# Patient Record
Sex: Male | Born: 1985 | Race: White | Hispanic: No | Marital: Single | Smoking: Never smoker
Health system: Southern US, Community
[De-identification: ages and names within clinical notes are randomized; demographics above are authoritative.]

## PROBLEM LIST (undated history)

## (undated) DIAGNOSIS — R4702 Dysphasia: Secondary | ICD-10-CM

## (undated) DIAGNOSIS — I639 Cerebral infarction, unspecified: Secondary | ICD-10-CM

## (undated) DIAGNOSIS — R519 Headache, unspecified: Secondary | ICD-10-CM

## (undated) DIAGNOSIS — I10 Essential (primary) hypertension: Secondary | ICD-10-CM

## (undated) DIAGNOSIS — I6330 Cerebral infarction due to thrombosis of unspecified cerebral artery: Secondary | ICD-10-CM

## (undated) DIAGNOSIS — E118 Type 2 diabetes mellitus with unspecified complications: Secondary | ICD-10-CM

## (undated) DIAGNOSIS — R51 Headache: Secondary | ICD-10-CM

## (undated) HISTORY — DX: Type 2 diabetes mellitus with unspecified complications: E11.8

## (undated) HISTORY — DX: Essential (primary) hypertension: I10

---

## 2006-05-23 ENCOUNTER — Emergency Department (HOSPITAL_COMMUNITY): Admission: EM | Admit: 2006-05-23 | Discharge: 2006-05-23 | Payer: Self-pay | Admitting: Emergency Medicine

## 2006-05-26 ENCOUNTER — Emergency Department (HOSPITAL_COMMUNITY): Admission: EM | Admit: 2006-05-26 | Discharge: 2006-05-26 | Payer: Self-pay | Admitting: Family Medicine

## 2008-09-03 ENCOUNTER — Emergency Department (HOSPITAL_COMMUNITY): Admission: EM | Admit: 2008-09-03 | Discharge: 2008-09-03 | Payer: Self-pay | Admitting: Family Medicine

## 2008-09-05 ENCOUNTER — Emergency Department (HOSPITAL_COMMUNITY): Admission: EM | Admit: 2008-09-05 | Discharge: 2008-09-05 | Payer: Self-pay | Admitting: Family Medicine

## 2011-06-10 LAB — CULTURE, ROUTINE-ABSCESS

## 2014-02-01 ENCOUNTER — Ambulatory Visit (INDEPENDENT_AMBULATORY_CARE_PROVIDER_SITE_OTHER): Payer: BC Managed Care – PPO | Admitting: Family Medicine

## 2014-02-01 VITALS — BP 158/110 | HR 86 | Temp 98.5°F | Resp 16 | Ht 69.0 in | Wt 245.4 lb

## 2014-02-01 DIAGNOSIS — E669 Obesity, unspecified: Secondary | ICD-10-CM

## 2014-02-01 DIAGNOSIS — IMO0001 Reserved for inherently not codable concepts without codable children: Secondary | ICD-10-CM

## 2014-02-01 DIAGNOSIS — I10 Essential (primary) hypertension: Secondary | ICD-10-CM

## 2014-02-01 LAB — COMPREHENSIVE METABOLIC PANEL
ALK PHOS: 82 U/L (ref 39–117)
ALT: 52 U/L (ref 0–53)
AST: 25 U/L (ref 0–37)
Albumin: 4.7 g/dL (ref 3.5–5.2)
BILIRUBIN TOTAL: 0.7 mg/dL (ref 0.2–1.2)
BUN: 14 mg/dL (ref 6–23)
CO2: 29 mEq/L (ref 19–32)
Calcium: 10.1 mg/dL (ref 8.4–10.5)
Chloride: 99 mEq/L (ref 96–112)
Creat: 1.17 mg/dL (ref 0.50–1.35)
GLUCOSE: 116 mg/dL — AB (ref 70–99)
Potassium: 5 mEq/L (ref 3.5–5.3)
SODIUM: 141 meq/L (ref 135–145)
TOTAL PROTEIN: 7.5 g/dL (ref 6.0–8.3)

## 2014-02-01 LAB — GLUCOSE, POCT (MANUAL RESULT ENTRY): POC Glucose: 111 mg/dl — AB (ref 70–99)

## 2014-02-01 MED ORDER — LISINOPRIL-HYDROCHLOROTHIAZIDE 10-12.5 MG PO TABS: 1.0000 | ORAL_TABLET | Freq: Every day | ORAL | Status: DC

## 2014-02-01 NOTE — Patient Instructions (Signed)
Managing Your High Blood Pressure Blood pressure is a measurement of how forceful your blood is pressing against the walls of the arteries. Arteries are muscular tubes within the circulatory system. Blood pressure does not stay the same. Blood pressure rises when you are active, excited, or nervous; and it lowers during sleep and relaxation. If the numbers measuring your blood pressure stay above normal most of the time, you are at risk for health problems. High blood pressure (hypertension) is a long-term (chronic) condition in which blood pressure is elevated. A blood pressure reading is recorded as two numbers, such as 120 over 80 (or 120/80). The first, higher number is called the systolic pressure. It is a measure of the pressure in your arteries as the heart beats. The second, lower number is called the diastolic pressure. It is a measure of the pressure in your arteries as the heart relaxes between beats.  Keeping your blood pressure in a normal range is important to your overall health and prevention of health problems, such as heart disease and stroke. When your blood pressure is uncontrolled, your heart has to work harder than normal. High blood pressure is a very common condition in adults because blood pressure tends to rise with age. Men and women are equally likely to have hypertension but at different times in life. Before age 45, men are more likely to have hypertension. After 30 years of age, women are more likely to have it. Hypertension is especially common in African Americans. This condition often has no signs or symptoms. The cause of the condition is usually not known. Your caregiver can help you come up with a plan to keep your blood pressure in a normal, healthy range. BLOOD PRESSURE STAGES Blood pressure is classified into four stages: normal, prehypertension, stage 1, and stage 2. Your blood pressure reading will be used to determine what type of treatment, if any, is necessary.  Appropriate treatment options are tied to these four stages:  Normal  Systolic pressure (mm Hg): below 120.  Diastolic pressure (mm Hg): below 80. Prehypertension  Systolic pressure (mm Hg): 120 to 139.  Diastolic pressure (mm Hg): 80 to 89. Stage1  Systolic pressure (mm Hg): 140 to 159.  Diastolic pressure (mm Hg): 90 to 99. Stage2  Systolic pressure (mm Hg): 160 or above.  Diastolic pressure (mm Hg): 100 or above. RISKS RELATED TO HIGH BLOOD PRESSURE Managing your blood pressure is an important responsibility. Uncontrolled high blood pressure can lead to:  A heart attack.  A stroke.  A weakened blood vessel (aneurysm).  Heart failure.  Kidney damage.  Eye damage.  Metabolic syndrome.  Memory and concentration problems. HOW TO MANAGE YOUR BLOOD PRESSURE Blood pressure can be managed effectively with lifestyle changes and medicines (if needed). Your caregiver will help you come up with a plan to bring your blood pressure within a normal range. Your plan should include the following: Education  Read all information provided by your caregivers about how to control blood pressure.  Educate yourself on the latest guidelines and treatment recommendations. New research is always being done to further define the risks and treatments for high blood pressure. Lifestylechanges  Control your weight.  Avoid smoking.  Stay physically active.  Reduce the amount of salt in your diet.  Reduce stress.  Control any chronic conditions, such as high cholesterol or diabetes.  Reduce your alcohol intake. Medicines  Several medicines (antihypertensive medicines) are available, if needed, to bring blood pressure within a normal range.   Communication  Review all the medicines you take with your caregiver because there may be side effects or interactions.  Talk with your caregiver about your diet, exercise habits, and other lifestyle factors that may be contributing to  high blood pressure.  See your caregiver regularly. Your caregiver can help you create and adjust your plan for managing high blood pressure. RECOMMENDATIONS FOR TREATMENT AND FOLLOW-UP  The following recommendations are based on current guidelines for managing high blood pressure in nonpregnant adults. Use these recommendations to identify the proper follow-up period or treatment option based on your blood pressure reading. You can discuss these options with your caregiver.  Systolic pressure of 120 to 139 or diastolic pressure of 80 to 89: Follow up with your caregiver as directed.  Systolic pressure of 140 to 160 or diastolic pressure of 90 to 100: Follow up with your caregiver within 2 months.  Systolic pressure above 160 or diastolic pressure above 100: Follow up with your caregiver within 1 month.  Systolic pressure above 180 or diastolic pressure above 110: Consider antihypertensive therapy; follow up with your caregiver within 1 week.  Systolic pressure above 200 or diastolic pressure above 120: Begin antihypertensive therapy; follow up with your caregiver within 1 week. Document Released: 05/16/2012 Document Reviewed: 05/16/2012 [facility] Patient Information 2014 [employer], [place]. Potassium Content of Foods Potassium is a mineral found in many foods and drinks. It helps keep fluids and minerals balanced in your body and also affects how steadily your heart beats. The body needs potassium to control blood pressure and to keep the muscles and nervous system healthy. However, certain health conditions and medicine may require you to eat more or less potassium-rich foods and drinks. Your caregiver or dietitian will tell you how much potassium you should have each day. COMMON SERVING SIZES The list below tells you how big or small common portion sizes are:  1 oz.........4 stacked dice.  3 oz........Marland KitchenDeck of cards.  1 tsp.......Marland KitchenTip of little finger.  1 tbsp....Marland KitchenMarland KitchenThumb.  2  tbsp....Marland KitchenMarland KitchenGolf ball.   c..........Marland KitchenHalf of a fist.  1 c...........Marland KitchenA fist. FOODS AND DRINKS HIGH IN POTASSIUM More than 200 mg of potassium per serving. A serving size is  c (120 mL or noted gram weight) unless otherwise stated. While all the items on this list are high in potassium, some items are higher in potassium than others. Fruits  Apricots (sliced), 83 g.  Apricots (dried halves), 3 oz / 24 g.  Avocado (cubed),  c / 50 g.  Banana (sliced), 75 g.  Cantaloupe (cubed), 80 g.  Dates (pitted), 5 whole / 35 g.  Figs (dried), 4 whole / 32 g.  Guava, c / 55 g.  Honeydew, 1 wedge / 85 g.  Kiwi (sliced), 90 g.  Nectarine, 1 small / 129 g.  Orange, 1 medium / 131 g.  Orange juice.  Pomegranate seeds, 87 g.  Pomegranate juice.  Prunes (pitted), 3 whole / 30 g.  Prune juice, 3 oz / 90 mL.  Seedless raisins, 3 tbsp / 27 g. Vegetables  Artichoke,  of a medium / 64 g.  Asparagus (boiled), 90 g.  Baked beans,  c / 63 g.  Bamboo shoots,  c / 38 g.  Beets (cooked slices), 85 g.  Broccoli (boiled), 78 g.  Brussels sprout (boiled), 78 g.  Butternut squash (baked), 103 g.  Chickpea (cooked), 82 g.  Green peas (cooked), 80 g.  Hubbard squash (baked cubes),  c / 68 g.  Kidney beans (cooked),  5 tbsp / 55 g.  Lima beans (cooked),  c / 43 g.  Navy beans (cooked),  c / 61 g.  Potato (baked), 61 g.  Potato (boiled), 78 g.  Pumpkin (boiled), 123 g.  Refried beans,  c / 79 g.  Spinach (cooked),  c / 45 g.  Split peas (cooked),  c / 65 g.  Sun-dried tomatoes, 2 tbsp / 7 g.  Sweet potato (baked),  c / 50 g.  Tomato (chopped or sliced), 90 g.  Tomato juice.  Tomato paste, 4 tsp / 21 g.  Tomato sauce,  c / 61 g.  Vegetable juice.  White mushrooms (cooked), 78 g.  Yam (cooked or baked),  c / 34 g.  Zucchini squash (boiled), 90 g. Other Foods and Drinks  Almonds (whole),  c / 36 g.  Cashews (oil roasted),  c / 32  g.  Chocolate milk.  Chocolate pudding, 142 g.  Clams (steamed), 1.5 oz / 43 g.  Dark chocolate, 1.5 oz / 42 g.  Fish, 3 oz / 85 g.  King crab (steamed), 3 oz / 85 g.  Lobster (steamed), 4 oz / 113 g.  Milk (skim, 1%, 2%, whole), 1 c / 240 mL.  Milk chocolate, 2.3 oz / 66 g.  Milk shake.  Nonfat fruit variety yogurt, 123 g.  Peanuts (oil roasted), 1 oz / 28 g.  Peanut butter, 2 tbsp / 32 g.  Pistachio nuts, 1 oz / 28 g.  Pumpkin seeds, 1 oz / 28 g.  Red meat (broiled, cooked, grilled), 3 oz / 85 g.  Scallops (steamed), 3 oz / 85 g.  Shredded wheat cereal (dry), 3 oblong biscuits / 75 g.  Spaghetti sauce,  c / 66 g.  Sunflower seeds (dry roasted), 1 oz / 28 g.  Veggie burger, 1 patty / 70 g. FOODS MODERATE IN POTASSIUM Between 150 mg and 200 mg per serving. A serving is  c (120 mL or noted gram weight) unless otherwise stated. Fruits  Grapefruit,  of the fruit / 123 g.  Grapefruit juice.  Pineapple juice.  Plums (sliced), 83 g.  Tangerine, 1 large / 120 g. Vegetables  Carrots (boiled), 78 g.  Carrots (sliced), 61 g.  Rhubarb (cooked with sugar), 120 g.  Rutabaga (cooked), 120 g.  Sweet corn (cooked), 75 g.  Yellow snap beans (cooked), 63 g. Other Foods and Drinks   Bagel, 1 bagel / 98 g.  Chicken breast (roasted and chopped),  c / 70 g.  Chocolate ice cream / 66 g.  Pita bread, 1 large / 64 g.  Shrimp (steamed), 4 oz / 113 g.  Swiss cheese (diced), 70 g.  Vanilla ice cream, 66 g.  Vanilla pudding, 140 g. FOODS LOW IN POTASSIUM Less than 150 mg per serving. A serving size is  cup (120 mL or noted gram weight) unless otherwise stated. If you eat more than 1 serving of a food low in potassium, the food may be considered a food high in potassium. Fruits  Apple (slices), 55 g.  Apple juice.  Applesauce, 122 g.  Blackberries, 72 g.  Blueberries, 74 g.  Cranberries, 50 g.  Cranberry juice.  Fruit cocktail, 119  g.  Fruit punch.  Grapes, 46 g.  Grape juice.  Mandarin oranges (canned), 126 g.  Peach (slices), 77 g.  Pineapple (chunks), 83 g.  Raspberries, 62 g.  Red cherries (without pits), 78 g.  Strawberries (sliced), 83 g.  Watermelon (diced), 76  g. Vegetables  Alfalfa sprouts, 17 g.  Bell peppers (sliced), 46 g.  Cabbage (shredded), 35 g.  Cauliflower (boiled), 62 g.  Celery, 51 g.  Collard greens (boiled), 95 g.  Cucumber (sliced), 52 g.  Eggplant (cubed), 41 g.  Green beans (boiled), 63 g.  Lettuce (shredded), 1 c / 36 g.  Onions (sauteed), 44 g.  Radishes (sliced), 58 g.  Spaghetti squash, 51 g. Other Foods and Drinks  MGM MIRAGEngel food cake, 1 slice / 28 g.  Black tea.  Brown rice (cooked), 98 g.  Butter croissant, 1 medium / 57 g.  Carbonated soda.  Coffee.  Cheddar cheese (diced), 66 g.  Corn flake cereal (dry), 14 g.  Cottage cheese, 118 g.  Cream of rice cereal (cooked), 122 g.  Cream of wheat cereal (cooked), 126 g.  Crisped rice cereal (dry), 14 g.  Egg (boiled, fried, poached, omelet, scrambled), 1 large / 46 61 g.  English muffin, 1 muffin / 57 g.  Frozen ice pop, 1 pop / 55 g.  Graham cracker, 1 large rectangular cracker / 14 g.  Jelly beans, 112 g.  Non-dairy whipped topping.  Oatmeal, 88 g.  Orange sherbet, 74 g.  Puffed rice cereal (dry), 7 g.  Pasta (cooked), 70 g.  Rice cakes, 4 cakes / 36 g.  Sugared doughnut, 4 oz / 116 g.  White bread, 1 slice / 30 g.  White rice (cooked), 79 93 g.  Wild rice (cooked), 82 g.  Yellow cake, 1 slice / 68 g. Document Released: 04/05/2005 Document Revised: 08/08/2012 Document Reviewed: 01/06/2012 [facility] Patient Information 2014 [place], [place]. DASH Diet The DASH diet stands for "Dietary Approaches to Stop Hypertension." It is a healthy eating plan that has been shown to reduce high blood pressure (hypertension) in as little as 14 days, while also possibly providing  other significant health benefits. These other health benefits include reducing the risk of breast cancer after menopause and reducing the risk of type 2 diabetes, heart disease, colon cancer, and stroke. Health benefits also include weight loss and slowing kidney failure in patients with chronic kidney disease.  DIET GUIDELINES  Limit salt (sodium). Your diet should contain less than 1500 mg of sodium daily.  Limit refined or processed carbohydrates. Your diet should include mostly whole grains. Desserts and added sugars should be used sparingly.  Include small amounts of heart-healthy fats. These types of fats include nuts, oils, and tub margarine. Limit saturated and trans fats. These fats have been shown to be harmful in the body. CHOOSING FOODS  The following food groups are based on a 2000 calorie diet. See your Registered Dietitian for individual calorie needs. Grains and Grain Products (6 to 8 servings daily)  Eat More Often: Whole-wheat bread, brown rice, whole-grain or wheat pasta, quinoa, popcorn without added fat or salt (air popped).  Eat Less Often: White bread, white pasta, white rice, cornbread. Vegetables (4 to 5 servings daily)  Eat More Often: Fresh, frozen, and canned vegetables. Vegetables may be raw, steamed, roasted, or grilled with a minimal amount of fat.  Eat Less Often/Avoid: Creamed or fried vegetables. Vegetables in a cheese sauce. Fruit (4 to 5 servings daily)  Eat More Often: All fresh, canned (in natural juice), or frozen fruits. Dried fruits without added sugar. One hundred percent fruit juice ( cup [237 mL] daily).  Eat Less Often: Dried fruits with added sugar. Canned fruit in light or heavy syrup. [employer] LockerLean Meats, Fish, and Poultry (2 servings or less  daily. One serving is 3 to 4 oz [85-114 g]).  Eat More Often: Ninety percent or leaner ground beef, tenderloin, sirloin. Round cuts of beef, chicken breast, [place] breast. All fish. Grill, bake, or broil your  meat. Nothing should be fried.  Eat Less Often/Avoid: Fatty cuts of meat, [place], or chicken leg, thigh, or wing. Fried cuts of meat or fish. Dairy (2 to 3 servings)  Eat More Often: Low-fat or fat-free milk, low-fat plain or light yogurt, reduced-fat or part-skim cheese.  Eat Less Often/Avoid: Milk (whole, 2%).Whole milk yogurt. Full-fat cheeses. Nuts, Seeds, and Legumes (4 to 5 servings per week)  Eat More Often: All without added salt.  Eat Less Often/Avoid: Salted nuts and seeds, canned beans with added salt. Fats and Sweets (limited)  Eat More Often: Vegetable oils, tub margarines without trans fats, sugar-free gelatin. Mayonnaise and salad dressings.  Eat Less Often/Avoid: Coconut oils, palm oils, butter, stick margarine, cream, half and half, cookies, candy, pie. FOR MORE INFORMATION The Dash Diet Eating Plan: www.dashdiet.org Document Released: 08/11/2011 Document Revised: 11/14/2011 Document Reviewed: 08/11/2011 [facility] Patient Information 2014 [place], [place].

## 2014-02-01 NOTE — Progress Notes (Signed)
Subjective:    Patient ID: Thomas Burch, male    DOB: [birth year], 28 y.o.   MRN: [id number]  HPI This chart was scribed for Norberto Sorenson, MD by Charline Bills, ED Scribe. The patient was seen in room 5. Patient's care was started at 9:12 AM. Chief Complaint  Patient presents with  . Hypertension    Pt states BP has been running elevated since last week-not on any meds    HPI Comments: Thomas Burch is a 28 y.o. male, with a h/o HTN, who presents to the [facility] complaining of elevated BP. Pt was diagnosed with HTN over 5-6 years ago and started on metoprolol which brought his BP down to 120s.  However, after 1-2 yrs on the medicine under a dr's guidance he was able to wean himself off of the metoprolol. Since then he reports that has normally readings are 130s-140s as does check BP at home monthly. He reports BP reading in 170s on Mother's Day weekend while at [facility] when he just incidentally checked. He has checked his BP more often since this reading and reports that his readings from last week were 140-150/100-114. He attributes elevated readings to poor diet during Mother's Day weekend and being awake for 24 hours due to working 3rd shift. He states that he has worked this schedule for 1 year but he is changing shifts soon. He denies associated HA, visual disturbances, SOB, palpitations, chest pain, leg swelling. He has tried cutting back on red meats since last week and has restarted his metoprolol and did take it today.   Pt has never been tested for sleep apnea. Pt states that people tell him "you sound like you're drowning" while sleeping but pt states that he sleeps well on his side. He is open to having a sleep study but states that he is unsure of when he will have time or if his insurance would cover it.  Pt also states that he is getting married in July.  Pt's father has a h/o HTN.  Past Medical History  Diagnosis Date  . Hypertension    No current  outpatient prescriptions on file prior to visit.   No current facility-administered medications on file prior to visit.   No Known Allergies  Review of Systems  Constitutional: Positive for fatigue (due to work). Negative for fever and chills.  Eyes: Negative for visual disturbance.  Respiratory: Positive for apnea. Negative for shortness of breath.   Cardiovascular: Negative for chest pain, palpitations and leg swelling.  Neurological: Negative for dizziness, syncope, facial asymmetry, weakness, light-headedness and headaches.  Psychiatric/Behavioral: Positive for sleep disturbance.     BP 158/110  Pulse 86  Temp(Src) 98.5 F (36.9 C) (Oral)  Resp 16  Ht 5\' 9"  (1.753 m)  Wt 245 lb 6.4 oz (111.313 kg)  BMI 36.22 kg/m2  SpO2 97% Objective:   Physical Exam  Nursing note and vitals reviewed. Constitutional: He is oriented to person, place, and time. He appears well-developed and well-nourished. No distress.  HENT:  Head: Normocephalic and atraumatic.  Eyes: EOM are normal.  Neck: Neck supple. No mass and no thyromegaly present.  Cardiovascular: Normal rate, regular rhythm, S1 normal and S2 normal.   No murmur heard. Pulmonary/Chest: Effort normal and breath sounds normal. No respiratory distress.  Musculoskeletal: Normal range of motion.  Lymphadenopathy:    He has no cervical adenopathy.  Neurological: He is alert and oriented to person, place, and time.  Skin: Skin  is warm and dry.  Psychiatric: He has a normal mood and affect. His behavior is normal.      Results for orders placed in visit on 02/01/14  COMPREHENSIVE METABOLIC PANEL      Result Value Ref Range   Sodium 141  135 - 145 mEq/L   Potassium 5.0  3.5 - 5.3 mEq/L   Chloride 99  96 - 112 mEq/L   CO2 29  19 - 32 mEq/L   Glucose, Bld 116 (*) 70 - 99 mg/dL   BUN 14  6 - 23 mg/dL   Creat 4.091.17  8.110.50 - 9.141.35 mg/dL   Total Bilirubin 0.7  0.2 - 1.2 mg/dL   Alkaline Phosphatase 82  39 - 117 U/L   AST 25  0 - 37 U/L     ALT 52  0 - 53 U/L   Total Protein 7.5  6.0 - 8.3 g/dL   Albumin 4.7  3.5 - 5.2 g/dL   Calcium 78.210.1  8.4 - 95.610.5 mg/dL  GLUCOSE, POCT (MANUAL RESULT ENTRY)      Result Value Ref Range   POC Glucose 111 (*) 70 - 99 mg/dl   Assessment & Plan:   Hypertension - Plan: POCT glucose (manual entry), Comprehensive metabolic panel - did well on metoprolol previously but will instead try below med so stop metoprolol. Cont to monitor bp at home and if 1 wk still >140/95 then increase med to bid and alert office.  F/u for BP recheck w/ me and fasting labs in 2-4 wks. Pt very likely has OSA as underlying cause but is not ready to proceed w/ diagnosis and trx - will reconsider in the future (states when he has more $$ and time).  Obesity, Class II, BMI 35-39.9, with comorbidity - Plan: POCT glucose (manual entry), Comprehensive metabolic panel  Meds ordered this encounter  Medications  . lisinopril-hydrochlorothiazide (PRINZIDE,ZESTORETIC) 10-12.5 MG per tablet    Sig: Take 1 tablet by mouth daily.    Dispense:  90 tablet    Refill:  0    I personally performed the services described in this documentation, which was scribed in my presence. The recorded information has been reviewed and considered, and addended by me as needed.  Norberto SorensonEva Kirstyn Lean, MD MPH

## 2014-02-02 ENCOUNTER — Encounter: Payer: Self-pay | Admitting: Family Medicine

## 2014-10-28 ENCOUNTER — Encounter (HOSPITAL_COMMUNITY): Payer: Self-pay | Admitting: *Deleted

## 2014-10-28 ENCOUNTER — Emergency Department (HOSPITAL_COMMUNITY)
Admission: EM | Admit: 2014-10-28 | Discharge: 2014-10-28 | Disposition: A | Discharge status: Home / Self Care | Payer: BLUE CROSS/BLUE SHIELD | Source: Home / Self Care | Attending: Family Medicine | Admitting: Family Medicine

## 2014-10-28 DIAGNOSIS — L0231 Cutaneous abscess of buttock: Secondary | ICD-10-CM

## 2014-10-28 DIAGNOSIS — I10 Essential (primary) hypertension: Secondary | ICD-10-CM

## 2014-10-28 MED ORDER — HYDROCODONE-ACETAMINOPHEN 5-325 MG PO TABS: 1.0000 | ORAL_TABLET | ORAL | Status: DC | PRN

## 2014-10-28 MED ORDER — LIDOCAINE HCL (PF) 1 % IJ SOLN
INTRAMUSCULAR | Status: AC
  Filled 2014-10-28: qty 5

## 2014-10-28 MED ORDER — SULFAMETHOXAZOLE-TRIMETHOPRIM 800-160 MG PO TABS: 1.0000 | ORAL_TABLET | Freq: Two times a day (BID) | ORAL | Status: AC

## 2014-10-28 NOTE — ED Notes (Signed)
Pt  Has  An  abcess  On     Buttock  Area      X  2  Days  =-   He states  It  Bursted  Last      Pm         He reports   He had  A  Similar  Episode     About  6  Years  Ago          His  BP   Is  Elevated  He  Has  Been  Non  Compliant with His  Lisinopril

## 2014-10-28 NOTE — Discharge Instructions (Signed)
Abscess °An abscess is an infected area that contains a collection of pus and debris. It can occur in almost any part of the body. An abscess is also known as a furuncle or boil. °CAUSES  °An abscess occurs when tissue gets infected. This can occur from blockage of oil or sweat glands, infection of hair follicles, or a minor injury to the skin. As the body tries to fight the infection, pus collects in the area and creates pressure under the skin. This pressure causes pain. People with weakened immune systems have difficulty fighting infections and get certain abscesses more often.  °SYMPTOMS °Usually an abscess develops on the skin and becomes a painful mass that is red, warm, and tender. If the abscess forms under the skin, you may feel a moveable soft area under the skin. Some abscesses break open (rupture) on their own, but most will continue to get worse without care. The infection can spread deeper into the body and eventually into the bloodstream, causing you to feel ill.  °DIAGNOSIS  °Your caregiver will take your medical history and perform a physical exam. A sample of fluid may also be taken from the abscess to determine what is causing your infection. °TREATMENT  °Your caregiver may prescribe antibiotic medicines to fight the infection. However, taking antibiotics alone usually does not cure an abscess. Your caregiver may need to make a small cut (incision) in the abscess to drain the pus. In some cases, gauze is packed into the abscess to reduce pain and to continue draining the area. °HOME CARE INSTRUCTIONS  °· Only take over-the-counter or prescription medicines for pain, discomfort, or fever as directed by your caregiver. °· If you were prescribed antibiotics, take them as directed. Finish them even if you start to feel better. °· If gauze is used, follow your caregiver's directions for changing the gauze. °· To avoid spreading the infection: °· Keep your draining abscess covered with a  bandage. °· Wash your hands well. °· Do not share personal care items, towels, or whirlpools with others. °· Avoid skin contact with others. °· Keep your skin and clothes clean around the abscess. °· Keep all follow-up appointments as directed by your caregiver. °SEEK MEDICAL CARE IF:  °· You have increased pain, swelling, redness, fluid drainage, or bleeding. °· You have muscle aches, chills, or a general ill feeling. °· You have a fever. °MAKE SURE YOU:  °· Understand these instructions. °· Will watch your condition. °· Will get help right away if you are not doing well or get worse. °Document Released: 06/01/2005 Document Revised: 02/21/2012 Document Reviewed: 11/04/2011 °Exit[employer]® Patient Information ©2015 [employer]. This information is not intended to replace advice given to you by your health care provider. Make sure you discuss any questions you have with your health care provider. ° °Hypertension °Hypertension, commonly called high blood pressure, is when the force of blood pumping through your arteries is too strong. Your arteries are the blood vessels that carry blood from your heart throughout your body. A blood pressure reading consists of a higher number over a lower number, such as 110/72. The higher number (systolic) is the pressure inside your arteries when your heart pumps. The lower number (diastolic) is the pressure inside your arteries when your heart relaxes. Ideally you want your blood pressure below 120/80. °Hypertension forces your heart to work harder to pump blood. Your arteries may become narrow or stiff. Having hypertension puts you at risk for heart disease, stroke, and other problems.  °RISK   FACTORS Some risk factors for high blood pressure are controllable. Others are not.  Risk factors you cannot control include:   Race. You may be at higher risk if you are African American.  Age. Risk increases with age.  Gender. Men are at higher risk than women before age 52 years.  After age 67, women are at higher risk than men. Risk factors you can control include:  Not getting enough exercise or physical activity.  Being overweight.  Getting too much fat, sugar, calories, or salt in your diet.  Drinking too much alcohol. SIGNS AND SYMPTOMS Hypertension does not usually cause signs or symptoms. Extremely high blood pressure (hypertensive crisis) may cause headache, anxiety, shortness of breath, and nosebleed. DIAGNOSIS  To check if you have hypertension, your health care provider will measure your blood pressure while you are seated, with your arm held at the level of your heart. It should be measured at least twice using the same arm. Certain conditions can cause a difference in blood pressure between your right and left arms. A blood pressure reading that is higher than normal on one occasion does not mean that you need treatment. If one blood pressure reading is high, ask your health care provider about having it checked again. TREATMENT  Treating high blood pressure includes making lifestyle changes and possibly taking medicine. Living a healthy lifestyle can help lower high blood pressure. You may need to change some of your habits. Lifestyle changes may include:  Following the DASH diet. This diet is high in fruits, vegetables, and whole grains. It is low in salt, red meat, and added sugars.  Getting at least 2 hours of brisk physical activity every week.  Losing weight if necessary.  Not smoking.  Limiting alcoholic beverages.  Learning ways to reduce stress. If lifestyle changes are not enough to get your blood pressure under control, your health care provider may prescribe medicine. You may need to take more than one. Work closely with your health care provider to understand the risks and benefits. HOME CARE INSTRUCTIONS  Have your blood pressure rechecked as directed by your health care provider.   Take medicines only as directed by your health  care provider. Follow the directions carefully. Blood pressure medicines must be taken as prescribed. The medicine does not work as well when you skip doses. Skipping doses also puts you at risk for problems.   Do not smoke.   Monitor your blood pressure at home as directed by your health care provider. SEEK MEDICAL CARE IF:   You think you are having a reaction to medicines taken.  You have recurrent headaches or feel dizzy.  You have swelling in your ankles.  You have trouble with your vision. SEEK IMMEDIATE MEDICAL CARE IF:  You develop a severe headache or confusion.  You have unusual weakness, numbness, or feel faint.  You have severe chest or abdominal pain.  You vomit repeatedly.  You have trouble breathing. MAKE SURE YOU:   Understand these instructions.  Will watch your condition.  Will get help right away if you are not doing well or get worse. Document Released: 08/22/2005 Document Revised: 01/06/2014 Document Reviewed: 06/14/2013 [facility] Patient Information 2015 [place], [place]. This information is not intended to replace advice given to you by your health care provider. Make sure you discuss any questions you have with your health care provider.

## 2014-10-28 NOTE — ED Provider Notes (Addendum)
CSN: [id number]     Arrival date & time 10/28/14  1302 History   First MD Initiated Contact with Patient 10/28/14 1359     Chief Complaint  Patient presents with  . Abscess   (Consider location/radiation/quality/duration/timing/severity/associated sxs/prior Treatment) HPI Comments: 29 year old obese male noticed an area of soreness to the upper left buttock adjacent to the gluteal cleft. Last evening he felt drainage oozing from this lesion. He is still having local tenderness.   Past Medical History  Diagnosis Date  . Hypertension    History reviewed. No pertinent past surgical history. Family History  Problem Relation Age of Onset  . Hypertension Father   . Stroke Father   . Hyperlipidemia Father   . Diabetes Maternal Grandmother    History  Substance Use Topics  . Smoking status: Never Smoker   . Smokeless tobacco: Not on file  . Alcohol Use: Yes     Comment: 1 drink per week    Review of Systems  All other systems reviewed and are negative.   Allergies  Review of patient's allergies indicates no known allergies.  Home Medications   Prior to Admission medications   Medication Sig Start Date End Date Taking? Authorizing Provider  HYDROcodone-acetaminophen (NORCO/VICODIN) 5-325 MG per tablet Take 1 tablet by mouth every 4 (four) hours as needed. 10/28/14   Hayden Rasmussenavid Neisha Hinger, NP  lisinopril-hydrochlorothiazide (PRINZIDE,ZESTORETIC) 10-12.5 MG per tablet Take 1 tablet by mouth daily. 02/01/14   Sherren MochaEva N Shaw, MD  sulfamethoxazole-trimethoprim (BACTRIM DS,SEPTRA DS) 800-160 MG per tablet Take 1 tablet by mouth 2 (two) times daily. 10/28/14 11/04/14  Hayden Rasmussenavid Samier Jaco, NP   BP 210/130 mmHg  Pulse 88  Temp(Src) 99.2 F (37.3 C) (Oral)  Resp 18  SpO2 100% Physical Exam  Constitutional: He is oriented to person, place, and time. He appears well-developed and well-nourished.  Neurological: He is alert and oriented to person, place, and time. He exhibits normal muscle tone.  Skin: Skin is  warm.  There remains a bullous  lesion measuring approximately 3 cm x 2 cm to the left buttock. The area and which she had draining appears to be sealed at this time and no fluid can be manually expressed.  Psychiatric: He has a normal mood and affect.  Nursing note and vitals reviewed.   ED Course  Procedures (including critical care time) Labs Review Labs Reviewed  CULTURE, ROUTINE-ABSCESS    Imaging Review No results found.   MDM   1. Abscess of buttock, left   2. Essential hypertension    Must start taking a blood pressure medicine and follow-up with your PCP regarding your elevated blood pressure. Instructions for abscess given. Return in 3 days, Friday morning for wound check and packing removal. For worsening may return sooner. Warm compresses.   Hayden Rasmussenavid Winifred Balogh, NP 10/28/14 1448  Hayden Rasmussenavid Ophia Shamoon, NP 10/31/14 1359

## 2014-10-31 ENCOUNTER — Encounter (HOSPITAL_COMMUNITY): Payer: Self-pay

## 2014-10-31 ENCOUNTER — Emergency Department (INDEPENDENT_AMBULATORY_CARE_PROVIDER_SITE_OTHER)
Admission: EM | Admit: 2014-10-31 | Discharge: 2014-10-31 | Disposition: A | Discharge status: Home / Self Care | Payer: BLUE CROSS/BLUE SHIELD | Source: Home / Self Care | Attending: Family Medicine | Admitting: Family Medicine

## 2014-10-31 DIAGNOSIS — Z5189 Encounter for other specified aftercare: Secondary | ICD-10-CM

## 2014-10-31 DIAGNOSIS — Z4801 Encounter for change or removal of surgical wound dressing: Secondary | ICD-10-CM

## 2014-10-31 DIAGNOSIS — I159 Secondary hypertension, unspecified: Secondary | ICD-10-CM

## 2014-10-31 MED ORDER — METOPROLOL TARTRATE 25 MG PO TABS: 25.0000 mg | ORAL_TABLET | Freq: Two times a day (BID) | ORAL | Status: DC

## 2014-10-31 NOTE — Discharge Instructions (Signed)
Hypertension Hypertension, commonly called high blood pressure, is when the force of blood pumping through your arteries is too strong. Your arteries are the blood vessels that carry blood from your heart throughout your body. A blood pressure reading consists of a higher number over a lower number, such as 110/72. The higher number (systolic) is the pressure inside your arteries when your heart pumps. The lower number (diastolic) is the pressure inside your arteries when your heart relaxes. Ideally you want your blood pressure below 120/80. Hypertension forces your heart to work harder to pump blood. Your arteries may become narrow or stiff. Having hypertension puts you at risk for heart disease, stroke, and other problems.  RISK FACTORS Some risk factors for high blood pressure are controllable. Others are not.  Risk factors you cannot control include:   Race. You may be at higher risk if you are African American.  Age. Risk increases with age.  Gender. Men are at higher risk than women before age 45 years. After age 65, women are at higher risk than men. Risk factors you can control include:  Not getting enough exercise or physical activity.  Being overweight.  Getting too much fat, sugar, calories, or salt in your diet.  Drinking too much alcohol. SIGNS AND SYMPTOMS Hypertension does not usually cause signs or symptoms. Extremely high blood pressure (hypertensive crisis) may cause headache, anxiety, shortness of breath, and nosebleed. DIAGNOSIS  To check if you have hypertension, your health care provider will measure your blood pressure while you are seated, with your arm held at the level of your heart. It should be measured at least twice using the same arm. Certain conditions can cause a difference in blood pressure between your right and left arms. A blood pressure reading that is higher than normal on one occasion does not mean that you need treatment. If one blood pressure  reading is high, ask your health care provider about having it checked again. TREATMENT  Treating high blood pressure includes making lifestyle changes and possibly taking medicine. Living a healthy lifestyle can help lower high blood pressure. You may need to change some of your habits. Lifestyle changes may include:  Following the DASH diet. This diet is high in fruits, vegetables, and whole grains. It is low in salt, red meat, and added sugars.  Getting at least 2 hours of brisk physical activity every week.  Losing weight if necessary.  Not smoking.  Limiting alcoholic beverages.  Learning ways to reduce stress. If lifestyle changes are not enough to get your blood pressure under control, your health care provider may prescribe medicine. You may need to take more than one. Work closely with your health care provider to understand the risks and benefits. HOME CARE INSTRUCTIONS  Have your blood pressure rechecked as directed by your health care provider.   Take medicines only as directed by your health care provider. Follow the directions carefully. Blood pressure medicines must be taken as prescribed. The medicine does not work as well when you skip doses. Skipping doses also puts you at risk for problems.   Do not smoke.   Monitor your blood pressure at home as directed by your health care provider. SEEK MEDICAL CARE IF:   You think you are having a reaction to medicines taken.  You have recurrent headaches or feel dizzy.  You have swelling in your ankles.  You have trouble with your vision. SEEK IMMEDIATE MEDICAL CARE IF:  You develop a severe headache or confusion.    You have unusual weakness, numbness, or feel faint.  You have severe chest or abdominal pain.  You vomit repeatedly.  You have trouble breathing. MAKE SURE YOU:   Understand these instructions.  Will watch your condition.  Will get help right away if you are not doing well or get  worse. Document Released: 08/22/2005 Document Revised: 01/06/2014 Document Reviewed: 06/14/2013 [employer] Patient Information 2015 [employer]. This information is not intended to replace advice given to you by your health care provider. Make sure you discuss any questions you have with your health care provider. Wound Care Wound care helps prevent pain and infection.  You may need a tetanus shot if:  You cannot remember when you had your last tetanus shot.  You have never had a tetanus shot.  The injury broke your skin. If you need a tetanus shot and you choose not to have one, you may get tetanus. Sickness from tetanus can be serious. HOME CARE   Only take medicine as told by your doctor.  Clean the wound daily with mild soap and water.  Change any bandages (dressings) as told by your doctor.  Put medicated cream and a bandage on the wound as told by your doctor.  Change the bandage if it gets wet, dirty, or starts to smell.  Take showers. Do not take baths, swim, or do anything that puts your wound under water.  Rest and raise (elevate) the wound until the pain and puffiness (swelling) are better.  Keep all doctor visits as told. GET HELP RIGHT AWAY IF:   Yellowish-white fluid (pus) comes from the wound.  Medicine does not lessen your pain.  There is a red streak going away from the wound.  You have a fever. MAKE SURE YOU:   Understand these instructions.  Will watch your condition.  Will get help right away if you are not doing well or get worse. Document Released: 05/31/2008 Document Revised: 11/14/2011 Document Reviewed: 12/26/2010 [employer] Patient Information 2015 [employer]. This information is not intended to replace advice given to you by your health care provider. Make sure you discuss any questions you have with your health care provider.  

## 2014-10-31 NOTE — ED Provider Notes (Signed)
CSN: [id number]     Arrival date & time 10/31/14  1330 History   First MD Initiated Contact with Patient 10/31/14 1352     Chief Complaint  Patient presents with  . Wound Check   (Consider location/radiation/quality/duration/timing/severity/associated sxs/prior Treatment) HPI Comments: For wound chk. Has been draining, dsg remaining in place as has the packing.  No current drainage, no cellulitis. Pt st feels well   Past Medical History  Diagnosis Date  . Hypertension    History reviewed. No pertinent past surgical history. Family History  Problem Relation Age of Onset  . Hypertension Father   . Stroke Father   . Hyperlipidemia Father   . Diabetes Maternal Grandmother    History  Substance Use Topics  . Smoking status: Never Smoker   . Smokeless tobacco: Not on file  . Alcohol Use: Yes     Comment: 1 drink per week    Review of Systems  All other systems reviewed and are negative.   Allergies  Review of patient's allergies indicates no known allergies.  Home Medications   Prior to Admission medications   Medication Sig Start Date End Date Taking? Authorizing Provider  HYDROcodone-acetaminophen (NORCO/VICODIN) 5-325 MG per tablet Take 1 tablet by mouth every 4 (four) hours as needed. 10/28/14   Hayden Rasmussenavid Twylla Arceneaux, NP  lisinopril-hydrochlorothiazide (PRINZIDE,ZESTORETIC) 10-12.5 MG per tablet Take 1 tablet by mouth daily. 02/01/14   Sherren MochaEva N Shaw, MD  metoprolol tartrate (LOPRESSOR) 25 MG tablet Take 1 tablet (25 mg total) by mouth 2 (two) times daily. 10/31/14   Hayden Rasmussenavid Shirleen Mcfaul, NP  sulfamethoxazole-trimethoprim (BACTRIM DS,SEPTRA DS) 800-160 MG per tablet Take 1 tablet by mouth 2 (two) times daily. 10/28/14 11/04/14  Hayden Rasmussenavid Jaeda Bruso, NP   BP 185/144 mmHg  Pulse 111  Temp(Src) 98.5 F (36.9 C) (Oral)  Resp 16  SpO2 95% Physical Exam  Constitutional: He is oriented to person, place, and time. He appears well-developed and well-nourished. No distress.  Neurological: He is alert and  oriented to person, place, and time.  Skin: Skin is warm and dry.  Nursing note and vitals reviewed.   ED Course  Procedures (including critical care time) Labs Review Labs Reviewed - No data to display  Imaging Review No results found.   MDM   1. Wound check, abscess   2. Secondary hypertension, unspecified     Packing and dsg removed. New dressing applied.  Go home take a hot shower, be sure to wick water out of wound. Cont ABX. No predominate microorganism growing on 3rd day. Patient's blood pressure is higher today than it was 3 days ago. His pulse is 109. He is asymptomatic. No chest pain or other discomfort, no shortness of breath he has had no sweating or nausea, no headache or focal neurologic symptoms. He has been on metoprolol before. Since his blood pressure is as elevated as it is all give him a few tablets of metoprolol 25 mg twice a day and he agrees to call his PCP either today or Monday for a follow-up appointment.     Hayden Rasmussenavid Tylen Leverich, NP 10/31/14 1431

## 2014-10-31 NOTE — ED Notes (Signed)
Recheck of buttocks abscess

## 2014-11-01 LAB — CULTURE, ROUTINE-ABSCESS: CULTURE: NO GROWTH

## 2016-10-08 ENCOUNTER — Encounter (HOSPITAL_COMMUNITY): Payer: Self-pay | Admitting: *Deleted

## 2016-10-08 ENCOUNTER — Ambulatory Visit (HOSPITAL_COMMUNITY)
Admission: EM | Admit: 2016-10-08 | Discharge: 2016-10-08 | Disposition: A | Discharge status: Home / Self Care | Payer: Commercial Managed Care - PPO | Attending: Family Medicine | Admitting: Family Medicine

## 2016-10-08 DIAGNOSIS — H6690 Otitis media, unspecified, unspecified ear: Secondary | ICD-10-CM | POA: Diagnosis not present

## 2016-10-08 DIAGNOSIS — I10 Essential (primary) hypertension: Secondary | ICD-10-CM | POA: Diagnosis not present

## 2016-10-08 MED ORDER — AMOXICILLIN 500 MG PO CAPS: 1000.0000 mg | ORAL_CAPSULE | Freq: Three times a day (TID) | ORAL | 0 refills | Status: AC

## 2016-10-08 MED ORDER — CLONIDINE HCL 0.1 MG PO TABS: 0.1000 mg | ORAL_TABLET | Freq: Two times a day (BID) | ORAL | 11 refills | Status: DC

## 2016-10-08 NOTE — ED Triage Notes (Signed)
PT   Reports     He  Ran  Out  Of  His bp  meds     And  He   Has   Symptoms  Of   Sinus  Congestion   With   Cough   And    Sinus   Drainage           Pt  Has  Been taking  OTC  MEDS       He  Also  Reports   Some  pressure  In the    l  Ear  As   Well

## 2016-10-08 NOTE — Discharge Instructions (Signed)
Please d/c your metoprolol and start Clonidine twice a day Also start the antibiotic for your ear infection.  If you BP won't come down, if you starts to have new symptoms such as chest pain, dizziness, headache, blurry vision or breathing problem; then you need to go to ER.

## 2016-10-08 NOTE — ED Provider Notes (Signed)
CSN: [id number]     Arrival date & time 10/08/16  1406 History   None    Chief Complaint  Patient presents with  . Hypertension   (Consider location/radiation/quality/duration/timing/severity/associated sxs/prior Treatment) Patient is a well-appearing 31 year old male, with history of hypertension currently on metoprolol 25 mg twice a day, presents today wanting medication refill for the metoprolol. Patient ran out of medication about 4-5 days ago. Reports his BP is normally 160 systolic. He doesn't think metoprolol is sufficient for his BP.  Blood pressure is 198/120 at repeat in room. HR also noted to be elevated at 118. Patient also reports 7-8 days duration of congestion, productive cough and ears feels full with earache at times. Patient believes that he has a sinus infection; he denies sinus pain or pressure. Patient has been taking several OTC meds including afrin, Sudafed and Alka-Seltzer plus. Patient does not have a primary care doctor. Patient denies shortness of breath, wheezing, chest pain, headache or blurred vision.         Past Medical History:  Diagnosis Date  . Hypertension    History reviewed. No pertinent surgical history. Family History  Problem Relation Age of Onset  . Hypertension Father   . Stroke Father   . Hyperlipidemia Father   . Diabetes Maternal Grandmother    Social History  Substance Use Topics  . Smoking status: Never Smoker  . Smokeless tobacco: Not on file  . Alcohol use Yes     Comment: 1 drink per week    Review of Systems  Constitutional:       As stated in the HPI    Allergies  Patient has no known allergies.  Home Medications   Prior to Admission medications   Medication Sig Start Date End Date Taking? Authorizing Provider  amoxicillin (AMOXIL) 500 MG capsule Take 2 capsules (1,000 mg total) by mouth 3 (three) times daily. 10/08/16 10/15/16  Lucia EstelleFeng Tanner Yeley, NP  cloNIDine (CATAPRES) 0.1 MG tablet Take 1 tablet (0.1 mg total) by mouth 2  (two) times daily. 10/08/16 11/07/16  Lucia EstelleFeng Sharin Altidor, NP  HYDROcodone-acetaminophen (NORCO/VICODIN) 5-325 MG per tablet Take 1 tablet by mouth every 4 (four) hours as needed. 10/28/14   Hayden Rasmussenavid Mabe, NP  lisinopril-hydrochlorothiazide (PRINZIDE,ZESTORETIC) 10-12.5 MG per tablet Take 1 tablet by mouth daily. 02/01/14   Sherren MochaEva N Shaw, MD  metoprolol tartrate (LOPRESSOR) 25 MG tablet Take 1 tablet (25 mg total) by mouth 2 (two) times daily. 10/31/14   Hayden Rasmussenavid Mabe, NP   Meds Ordered and Administered this Visit  Medications - No data to display  BP (!) 210/120 (BP Location: Left Arm)   Pulse 118   Temp 98.6 F (37 C) (Oral)   Resp 20   SpO2 100%  No data found.   Physical Exam  Constitutional: He is oriented to person, place, and time. He appears well-developed and well-nourished.  HENT:  Head: Normocephalic.  Nose: Nose normal.  Mouth/Throat: Oropharynx is clear and moist. No oropharyngeal exudate.  Bilateral TM are erythematous, no perforation. No sinus tenderness on percussion  Eyes: Conjunctivae are normal. Pupils are equal, round, and reactive to light.  Neck: Normal range of motion.  Cardiovascular: Normal rate and regular rhythm.   HR 118   Pulmonary/Chest: Effort normal. No respiratory distress.  Diffuse wheezing noted  Abdominal: Soft. Bowel sounds are normal.  Lymphadenopathy:    He has no cervical adenopathy.  Neurological: He is alert and oriented to person, place, and time.  Skin: Skin is warm and dry.  Nursing note and vitals reviewed.   Urgent Care Course     Procedures (including critical care time)  Labs Review Labs Reviewed - No data to display  Imaging Review No results found.  MDM   1. Acute otitis media, unspecified otitis media type   2. Essential hypertension    Emergency room was recommended but patient refused.  1) Will treat the ear infection with Amoxicillin 1g TID x 7 days 2) D/c Metoprolol; start Clonidine 0.1 mg BID; please establish care with a PCP  ASAP and follow up next week 3) ER return precaution discussed.   Supervising physician consulted for this visit.     Lucia Estelle, NP 10/08/16 1640

## 2017-04-29 ENCOUNTER — Emergency Department (HOSPITAL_COMMUNITY): Payer: Commercial Managed Care - PPO

## 2017-04-29 ENCOUNTER — Encounter (HOSPITAL_COMMUNITY): Payer: Self-pay | Admitting: Emergency Medicine

## 2017-04-29 ENCOUNTER — Inpatient Hospital Stay (HOSPITAL_COMMUNITY)
Admission: EM | Admit: 2017-04-29 | Discharge: 2017-05-04 | DRG: 304 | Disposition: A | Discharge status: Home / Self Care | Payer: Commercial Managed Care - PPO | Attending: Family Medicine | Admitting: Family Medicine

## 2017-04-29 DIAGNOSIS — E785 Hyperlipidemia, unspecified: Secondary | ICD-10-CM

## 2017-04-29 DIAGNOSIS — E1165 Type 2 diabetes mellitus with hyperglycemia: Secondary | ICD-10-CM

## 2017-04-29 DIAGNOSIS — Z79899 Other long term (current) drug therapy: Secondary | ICD-10-CM

## 2017-04-29 DIAGNOSIS — R4182 Altered mental status, unspecified: Secondary | ICD-10-CM | POA: Diagnosis present

## 2017-04-29 DIAGNOSIS — I5030 Unspecified diastolic (congestive) heart failure: Secondary | ICD-10-CM | POA: Diagnosis present

## 2017-04-29 DIAGNOSIS — G43909 Migraine, unspecified, not intractable, without status migrainosus: Secondary | ICD-10-CM | POA: Diagnosis present

## 2017-04-29 DIAGNOSIS — R509 Fever, unspecified: Secondary | ICD-10-CM | POA: Diagnosis not present

## 2017-04-29 DIAGNOSIS — R29700 NIHSS score 0: Secondary | ICD-10-CM | POA: Diagnosis present

## 2017-04-29 DIAGNOSIS — R05 Cough: Secondary | ICD-10-CM | POA: Diagnosis present

## 2017-04-29 DIAGNOSIS — IMO0002 Reserved for concepts with insufficient information to code with codable children: Secondary | ICD-10-CM

## 2017-04-29 DIAGNOSIS — E872 Acidosis, unspecified: Secondary | ICD-10-CM

## 2017-04-29 DIAGNOSIS — G0490 Encephalitis and encephalomyelitis, unspecified: Secondary | ICD-10-CM | POA: Diagnosis not present

## 2017-04-29 DIAGNOSIS — R059 Cough, unspecified: Secondary | ICD-10-CM | POA: Insufficient documentation

## 2017-04-29 DIAGNOSIS — Z9114 Patient's other noncompliance with medication regimen: Secondary | ICD-10-CM

## 2017-04-29 DIAGNOSIS — E1065 Type 1 diabetes mellitus with hyperglycemia: Secondary | ICD-10-CM | POA: Diagnosis present

## 2017-04-29 DIAGNOSIS — I6529 Occlusion and stenosis of unspecified carotid artery: Secondary | ICD-10-CM | POA: Diagnosis present

## 2017-04-29 DIAGNOSIS — I161 Hypertensive emergency: Principal | ICD-10-CM | POA: Diagnosis present

## 2017-04-29 DIAGNOSIS — D72828 Other elevated white blood cell count: Secondary | ICD-10-CM

## 2017-04-29 DIAGNOSIS — I160 Hypertensive urgency: Secondary | ICD-10-CM | POA: Diagnosis not present

## 2017-04-29 DIAGNOSIS — R739 Hyperglycemia, unspecified: Secondary | ICD-10-CM | POA: Insufficient documentation

## 2017-04-29 DIAGNOSIS — Z823 Family history of stroke: Secondary | ICD-10-CM

## 2017-04-29 DIAGNOSIS — I6340 Cerebral infarction due to embolism of unspecified cerebral artery: Secondary | ICD-10-CM | POA: Diagnosis present

## 2017-04-29 DIAGNOSIS — I110 Hypertensive heart disease with heart failure: Secondary | ICD-10-CM | POA: Diagnosis present

## 2017-04-29 DIAGNOSIS — E669 Obesity, unspecified: Secondary | ICD-10-CM | POA: Diagnosis present

## 2017-04-29 DIAGNOSIS — E876 Hypokalemia: Secondary | ICD-10-CM | POA: Diagnosis present

## 2017-04-29 DIAGNOSIS — F840 Autistic disorder: Secondary | ICD-10-CM | POA: Diagnosis present

## 2017-04-29 DIAGNOSIS — I639 Cerebral infarction, unspecified: Secondary | ICD-10-CM | POA: Diagnosis not present

## 2017-04-29 DIAGNOSIS — Z6834 Body mass index (BMI) 34.0-34.9, adult: Secondary | ICD-10-CM

## 2017-04-29 DIAGNOSIS — I674 Hypertensive encephalopathy: Secondary | ICD-10-CM | POA: Diagnosis present

## 2017-04-29 HISTORY — DX: Cerebral infarction, unspecified: I63.9

## 2017-04-29 HISTORY — DX: Headache, unspecified: R51.9

## 2017-04-29 HISTORY — DX: Headache: R51

## 2017-04-29 LAB — URINALYSIS, ROUTINE W REFLEX MICROSCOPIC
BILIRUBIN URINE: NEGATIVE
Glucose, UA: >=500 mg/dL — AB
Hgb urine dipstick: NEGATIVE
KETONES UR: 5 mg/dL — AB
LEUKOCYTES UA: NEGATIVE
Nitrite: NEGATIVE
PH: 6 (ref 5.0–8.0)
PROTEIN: NEGATIVE mg/dL
SQUAMOUS EPITHELIAL / LPF: NONE SEEN
Specific Gravity, Urine: 1.018 (ref 1.005–1.030)

## 2017-04-29 LAB — TSH: TSH: 0.493 u[IU]/mL (ref 0.350–4.500)

## 2017-04-29 LAB — CSF CELL COUNT WITH DIFFERENTIAL
RBC COUNT CSF: 0 /mm3
RBC Count, CSF: 133 /mm3 — ABNORMAL HIGH
TUBE #: 1
Tube #: 4
WBC CSF: 0 /mm3 (ref 0–5)
WBC, CSF: 0 /mm3 (ref 0–5)

## 2017-04-29 LAB — CBC WITH DIFFERENTIAL/PLATELET
BASOS ABS: 0 10*3/uL (ref 0.0–0.1)
BASOS PCT: 0 %
EOS ABS: 0 10*3/uL (ref 0.0–0.7)
EOS PCT: 0 %
HCT: 49.7 % (ref 39.0–52.0)
HEMOGLOBIN: 17.6 g/dL — AB (ref 13.0–17.0)
Lymphocytes Relative: 11 %
Lymphs Abs: 2 10*3/uL (ref 0.7–4.0)
MCH: 29.8 pg (ref 26.0–34.0)
MCHC: 35.4 g/dL (ref 30.0–36.0)
MCV: 84.2 fL (ref 78.0–100.0)
Monocytes Absolute: 0.7 10*3/uL (ref 0.1–1.0)
Monocytes Relative: 4 %
NEUTROS PCT: 85 %
Neutro Abs: 15.7 10*3/uL — ABNORMAL HIGH (ref 1.7–7.7)
Platelets: 280 10*3/uL (ref 150–400)
RBC: 5.9 MIL/uL — AB (ref 4.22–5.81)
RDW: 12.5 % (ref 11.5–15.5)
WBC: 18.4 10*3/uL — AB (ref 4.0–10.5)

## 2017-04-29 LAB — COMPREHENSIVE METABOLIC PANEL
ALK PHOS: 98 U/L (ref 38–126)
ALT: 65 U/L — AB (ref 17–63)
AST: 39 U/L (ref 15–41)
Albumin: 4.1 g/dL (ref 3.5–5.0)
Anion gap: 14 (ref 5–15)
BILIRUBIN TOTAL: 1.3 mg/dL — AB (ref 0.3–1.2)
BUN: 8 mg/dL (ref 6–20)
CALCIUM: 9.1 mg/dL (ref 8.9–10.3)
CHLORIDE: 95 mmol/L — AB (ref 101–111)
CO2: 24 mmol/L (ref 22–32)
CREATININE: 1.03 mg/dL (ref 0.61–1.24)
Glucose, Bld: 374 mg/dL — ABNORMAL HIGH (ref 65–99)
Potassium: 3.8 mmol/L (ref 3.5–5.1)
Sodium: 133 mmol/L — ABNORMAL LOW (ref 135–145)
TOTAL PROTEIN: 7.2 g/dL (ref 6.5–8.1)

## 2017-04-29 LAB — CBG MONITORING, ED: Glucose-Capillary: 346 mg/dL — ABNORMAL HIGH (ref 65–99)

## 2017-04-29 LAB — RAPID URINE DRUG SCREEN, HOSP PERFORMED
Amphetamines: NOT DETECTED
Barbiturates: NOT DETECTED
Benzodiazepines: NOT DETECTED
Cocaine: NOT DETECTED
OPIATES: NOT DETECTED
TETRAHYDROCANNABINOL: NOT DETECTED

## 2017-04-29 LAB — CREATININE, SERUM
CREATININE: 1.09 mg/dL (ref 0.61–1.24)
GFR calc Af Amer: >60 mL/min (ref >60)
GFR calc non Af Amer: >60 mL/min (ref >60)

## 2017-04-29 LAB — I-STAT CG4 LACTIC ACID, ED
LACTIC ACID, VENOUS: 2.97 mmol/L — AB (ref 0.5–1.9)
Lactic Acid, Venous: 2.47 mmol/L (ref 0.5–1.9)

## 2017-04-29 LAB — GLUCOSE, CAPILLARY: Glucose-Capillary: 380 mg/dL — ABNORMAL HIGH (ref 65–99)

## 2017-04-29 LAB — PROTEIN, CSF: Total  Protein, CSF: 24 mg/dL (ref 15–45)

## 2017-04-29 LAB — HEMOGLOBIN A1C
Hgb A1c MFr Bld: 11.9 % — ABNORMAL HIGH (ref 4.8–5.6)
Mean Plasma Glucose: 294.83 mg/dL

## 2017-04-29 LAB — GLUCOSE, CSF: Glucose, CSF: 166 mg/dL — ABNORMAL HIGH (ref 40–70)

## 2017-04-29 MED ORDER — LABETALOL HCL 5 MG/ML IV SOLN
20.0000 mg | Freq: Once | INTRAVENOUS | Status: AC
  Administered 2017-04-29: 20 mg via INTRAVENOUS
  Filled 2017-04-29: qty 4

## 2017-04-29 MED ORDER — ENOXAPARIN SODIUM 40 MG/0.4ML ~~LOC~~ SOLN
40.0000 mg | SUBCUTANEOUS | Status: DC
  Administered 2017-04-29 – 2017-05-01 (×3): 40 mg via SUBCUTANEOUS
  Filled 2017-04-29 (×3): qty 0.4

## 2017-04-29 MED ORDER — LIDOCAINE HCL (PF) 1 % IJ SOLN
10.0000 mL | Freq: Once | INTRAMUSCULAR | Status: AC
  Administered 2017-04-29: 10 mL via INTRADERMAL
  Filled 2017-04-29: qty 10

## 2017-04-29 MED ORDER — VANCOMYCIN HCL IN DEXTROSE 1-5 GM/200ML-% IV SOLN
1000.0000 mg | Freq: Three times a day (TID) | INTRAVENOUS | Status: DC
  Administered 2017-04-29 – 2017-05-01 (×5): 1000 mg via INTRAVENOUS
  Filled 2017-04-29 (×7): qty 200

## 2017-04-29 MED ORDER — SODIUM CHLORIDE 0.9 % IV BOLUS (SEPSIS)
1000.0000 mL | Freq: Once | INTRAVENOUS | Status: AC
  Administered 2017-04-29: 1000 mL via INTRAVENOUS

## 2017-04-29 MED ORDER — SODIUM CHLORIDE 0.9 % IV BOLUS (SEPSIS): 1000.0000 mL | Freq: Once | INTRAVENOUS | Status: DC

## 2017-04-29 MED ORDER — DEXTROSE 5 % IV SOLN: 2.0000 g | Freq: Two times a day (BID) | INTRAVENOUS | Status: DC

## 2017-04-29 MED ORDER — LACTATED RINGERS IV BOLUS (SEPSIS): 1000.0000 mL | Freq: Once | INTRAVENOUS | Status: DC

## 2017-04-29 MED ORDER — SODIUM CHLORIDE 0.9 % IV SOLN
2500.0000 mg | Freq: Once | INTRAVENOUS | Status: AC
  Administered 2017-04-29: 2500 mg via INTRAVENOUS
  Filled 2017-04-29: qty 2500

## 2017-04-29 MED ORDER — VANCOMYCIN HCL IN DEXTROSE 1-5 GM/200ML-% IV SOLN
1000.0000 mg | Freq: Once | INTRAVENOUS | Status: DC
  Filled 2017-04-29: qty 200

## 2017-04-29 MED ORDER — DEXAMETHASONE SODIUM PHOSPHATE 10 MG/ML IJ SOLN
10.0000 mg | Freq: Once | INTRAMUSCULAR | Status: AC
Start: 2017-04-29 — End: 2017-04-29
  Administered 2017-04-29: 10 mg via INTRAVENOUS
  Filled 2017-04-29: qty 1

## 2017-04-29 MED ORDER — INSULIN ASPART 100 UNIT/ML ~~LOC~~ SOLN
0.0000 [IU] | Freq: Three times a day (TID) | SUBCUTANEOUS | Status: DC
  Administered 2017-04-30: 5 [IU] via SUBCUTANEOUS
  Administered 2017-04-30: 3 [IU] via SUBCUTANEOUS
  Administered 2017-04-30 (×2): 7 [IU] via SUBCUTANEOUS
  Administered 2017-05-01: 5 [IU] via SUBCUTANEOUS
  Administered 2017-05-01: 7 [IU] via SUBCUTANEOUS
  Administered 2017-05-01 – 2017-05-02 (×4): 5 [IU] via SUBCUTANEOUS
  Administered 2017-05-03: 2 [IU] via SUBCUTANEOUS
  Administered 2017-05-03: 5 [IU] via SUBCUTANEOUS
  Administered 2017-05-03: 2 [IU] via SUBCUTANEOUS
  Administered 2017-05-03 – 2017-05-04 (×2): 3 [IU] via SUBCUTANEOUS

## 2017-04-29 MED ORDER — LABETALOL HCL 5 MG/ML IV SOLN
10.0000 mg | Freq: Once | INTRAVENOUS | Status: AC
  Administered 2017-04-29: 10 mg via INTRAVENOUS
  Filled 2017-04-29: qty 4

## 2017-04-29 MED ORDER — INSULIN ASPART 100 UNIT/ML ~~LOC~~ SOLN
7.0000 [IU] | Freq: Once | SUBCUTANEOUS | Status: AC
  Administered 2017-04-29: 7 [IU] via SUBCUTANEOUS

## 2017-04-29 MED ORDER — ACYCLOVIR SODIUM 50 MG/ML IV SOLN
1000.0000 mg | Freq: Three times a day (TID) | INTRAVENOUS | Status: DC
  Administered 2017-04-29 – 2017-05-02 (×9): 1000 mg via INTRAVENOUS
  Filled 2017-04-29 (×9): qty 20

## 2017-04-29 MED ORDER — IPRATROPIUM-ALBUTEROL 0.5-2.5 (3) MG/3ML IN SOLN: 3.0000 mL | Freq: Four times a day (QID) | RESPIRATORY_TRACT | Status: DC | PRN

## 2017-04-29 MED ORDER — DEXTROSE 5 % IV SOLN
2.0000 g | Freq: Two times a day (BID) | INTRAVENOUS | Status: DC
  Administered 2017-04-30 – 2017-05-01 (×4): 2 g via INTRAVENOUS
  Filled 2017-04-29 (×5): qty 2

## 2017-04-29 MED ORDER — SODIUM CHLORIDE 0.9 % IV BOLUS (SEPSIS)
500.0000 mL | Freq: Once | INTRAVENOUS | Status: AC
  Administered 2017-04-29: 500 mL via INTRAVENOUS

## 2017-04-29 MED ORDER — CLONIDINE HCL 0.1 MG PO TABS
0.1000 mg | ORAL_TABLET | Freq: Two times a day (BID) | ORAL | Status: DC
  Administered 2017-04-29 – 2017-05-02 (×7): 0.1 mg via ORAL
  Filled 2017-04-29 (×7): qty 1

## 2017-04-29 MED ORDER — LORAZEPAM 2 MG/ML IJ SOLN
1.0000 mg | Freq: Once | INTRAMUSCULAR | Status: AC
  Administered 2017-04-29: 1 mg via INTRAVENOUS
  Filled 2017-04-29: qty 1

## 2017-04-29 MED ORDER — HYDRALAZINE HCL 20 MG/ML IJ SOLN
10.0000 mg | INTRAMUSCULAR | Status: DC | PRN
  Administered 2017-04-30 – 2017-05-04 (×8): 10 mg via INTRAVENOUS
  Filled 2017-04-29 (×8): qty 1

## 2017-04-29 MED ORDER — LISINOPRIL 10 MG PO TABS
10.0000 mg | ORAL_TABLET | Freq: Once | ORAL | Status: AC
  Administered 2017-04-29: 10 mg via ORAL
  Filled 2017-04-29: qty 1

## 2017-04-29 MED ORDER — BENZONATATE 100 MG PO CAPS: 100.0000 mg | ORAL_CAPSULE | Freq: Three times a day (TID) | ORAL | Status: DC | PRN

## 2017-04-29 MED ORDER — VANCOMYCIN HCL IN DEXTROSE 1-5 GM/200ML-% IV SOLN
1000.0000 mg | Freq: Three times a day (TID) | INTRAVENOUS | Status: DC
  Filled 2017-04-29: qty 200

## 2017-04-29 MED ORDER — ACETAMINOPHEN 500 MG PO TABS
1000.0000 mg | ORAL_TABLET | Freq: Once | ORAL | Status: AC
  Administered 2017-04-29: 1000 mg via ORAL
  Filled 2017-04-29: qty 2

## 2017-04-29 MED ORDER — DEXTROSE 5 % IV SOLN
2.0000 g | Freq: Once | INTRAVENOUS | Status: AC
  Administered 2017-04-29: 2 g via INTRAVENOUS
  Filled 2017-04-29: qty 2

## 2017-04-29 MED ORDER — SODIUM CHLORIDE 0.9% FLUSH
3.0000 mL | Freq: Two times a day (BID) | INTRAVENOUS | Status: DC
  Administered 2017-05-01 – 2017-05-04 (×6): 3 mL via INTRAVENOUS

## 2017-04-29 MED ORDER — CLONIDINE HCL 0.1 MG PO TABS
0.1000 mg | ORAL_TABLET | Freq: Once | ORAL | Status: AC
  Administered 2017-04-29: 0.1 mg via ORAL
  Filled 2017-04-29: qty 1

## 2017-04-29 NOTE — ED Notes (Signed)
Admitting Provider at bedside. 

## 2017-04-29 NOTE — ED Notes (Signed)
ED Provider at bedside. 

## 2017-04-29 NOTE — Progress Notes (Signed)
Pt arrived to his assigned room number 6 and ER initiated the droplet precaution for possible meningitis. Will cont to monitor.

## 2017-04-29 NOTE — ED Triage Notes (Signed)
Confusion, high blood pressure-- fever-- started last night-- headache last night also--

## 2017-04-29 NOTE — ED Provider Notes (Addendum)
[facility]-EMERGENCY DEPT Provider Note   CSN: [id number] Arrival date & time: 04/29/17  7829     History   Chief Complaint Chief Complaint  Patient presents with  . stroke like symptoms  . Headache  . Hypertension    HPI Thomas Burch is a 31 y.o. male.  HPI   31 yo M with PMHx severe HTN here with altered mental status. Per pt and friend's report, pt's sx started yesterday. He reportedly was at his friends house last night when he started "acting weird." He reportedly had difficulty finding words and would intermittently space out. He began having a significant frontal HA as well. He was just "not acting right" per friend. He went to bed and awoke several times due to a dull, aching, frontal HA. He has had progressive worsening AMS throughout the day today, as well as af ever. No recent sick contacts. No h/o similar sx. No focal weakness or numbness. He has seemed unsteady on feet per friend.  Level 5 caveat invoked as remainder of history, ROS, and physical exam limited due to patient's AMS.   Past Medical History:  Diagnosis Date  . Hypertension     Patient Active Problem List   Diagnosis Date Noted  . Altered mental status, unspecified 04/29/2017  . Elevated temperature   . Cough   . Hyperglycemia   . Hypertension 02/01/2014  . Obesity, Class II, BMI 35-39.9, with comorbidity 02/01/2014    History reviewed. No pertinent surgical history.     Home Medications    Prior to Admission medications   Medication Sig Start Date End Date Taking? Authorizing Provider  cloNIDine (CATAPRES) 0.1 MG tablet Take 1 tablet (0.1 mg total) by mouth 2 (two) times daily. 10/08/16 04/29/17 Yes Lucia Estelle, NP    Family History Family History  Problem Relation Age of Onset  . Hypertension Father   . Stroke Father   . Hyperlipidemia Father   . Diabetes Maternal Grandmother     Social History Social History  Substance Use Topics  . Smoking status: Never Smoker  . Smokeless  tobacco: Never Used  . Alcohol use Yes     Comment: 1 drink per week     Allergies   Patient has no known allergies.   Review of Systems Review of Systems  Constitutional: Positive for fatigue and fever. Negative for chills.  HENT: Negative for congestion and rhinorrhea.   Eyes: Negative for visual disturbance.  Respiratory: Negative for cough, shortness of breath and wheezing.   Cardiovascular: Negative for chest pain and leg swelling.  Gastrointestinal: Negative for abdominal pain, diarrhea, nausea and vomiting.  Genitourinary: Negative for dysuria and flank pain.  Musculoskeletal: Negative for neck pain and neck stiffness.  Skin: Negative for rash and wound.  Allergic/Immunologic: Negative for immunocompromised state.  Neurological: Positive for speech difficulty, weakness and headaches. Negative for syncope.  Psychiatric/Behavioral: Positive for confusion.  All other systems reviewed and are negative.    Physical Exam Updated Vital Signs BP (!) 145/92   Pulse (!) 105   Temp (!) 100.4 F (38 C) (Oral)   Resp (!) 21   Ht 5\' 10"  (1.778 m)   Wt 108.9 kg (240 lb)   SpO2 91%   BMI 34.44 kg/m   Physical Exam  Constitutional: He appears well-developed and well-nourished. No distress.  HENT:  Head: Normocephalic and atraumatic.  Eyes: Conjunctivae are normal.  Neck: Neck supple.  No meningismus  Cardiovascular: Normal rate, regular rhythm and normal heart sounds.  Exam reveals no friction rub.   No murmur heard. Pulmonary/Chest: Effort normal and breath sounds normal. No respiratory distress. He has no wheezes. He has no rales.  Abdominal: He exhibits no distension.  Musculoskeletal: He exhibits no edema.  Neurological: He exhibits normal muscle tone.  Alert, oriented x4. Speech fluent but with intermittent word-finding difficulty and paraphasic errors. Face symmetric, tongue midline. MAE with 5/5 strength. Normal sensation to light touch b/l UE and LE. Normal FTN.  Gait deferred.  Skin: Skin is warm. Capillary refill takes less than 2 seconds.  Psychiatric: He has a normal mood and affect.  Nursing note and vitals reviewed.    ED Treatments / Results  Labs (all labs ordered are listed, but only abnormal results are displayed) Labs Reviewed  COMPREHENSIVE METABOLIC PANEL - Abnormal; Notable for the following:       Result Value   Sodium 133 (*)    Chloride 95 (*)    Glucose, Bld 374 (*)    ALT 65 (*)    Total Bilirubin 1.3 (*)    All other components within normal limits  CBC WITH DIFFERENTIAL/PLATELET - Abnormal; Notable for the following:    WBC 18.4 (*)    RBC 5.90 (*)    Hemoglobin 17.6 (*)    Neutro Abs 15.7 (*)    All other components within normal limits  URINALYSIS, ROUTINE W REFLEX MICROSCOPIC - Abnormal; Notable for the following:    Glucose, UA >=500 (*)    Ketones, ur 5 (*)    Bacteria, UA RARE (*)    All other components within normal limits  CSF CELL COUNT WITH DIFFERENTIAL - Abnormal; Notable for the following:    RBC Count, CSF 133 (*)    All other components within normal limits  GLUCOSE, CSF - Abnormal; Notable for the following:    Glucose, CSF 166 (*)    All other components within normal limits  CBG MONITORING, ED - Abnormal; Notable for the following:    Glucose-Capillary 346 (*)    All other components within normal limits  I-STAT CG4 LACTIC ACID, ED - Abnormal; Notable for the following:    Lactic Acid, Venous 2.97 (*)    All other components within normal limits  I-STAT CG4 LACTIC ACID, ED - Abnormal; Notable for the following:    Lactic Acid, Venous 2.47 (*)    All other components within normal limits  CSF CULTURE  CULTURE, BLOOD (ROUTINE X 2)  CULTURE, BLOOD (ROUTINE X 2)  HSV CULTURE AND TYPING  RAPID URINE DRUG SCREEN, HOSP PERFORMED  CSF CELL COUNT WITH DIFFERENTIAL  PROTEIN, CSF  HERPES SIMPLEX VIRUS(HSV) DNA BY PCR  HIV ANTIBODY (ROUTINE TESTING)  CBC  CREATININE, SERUM  HEMOGLOBIN A1C    TSH  BASIC METABOLIC PANEL    EKG  EKG Interpretation  Date/Time:  Saturday April 29 2017 10:06:21 EDT Ventricular Rate:  132 PR Interval:    QRS Duration: 88 QT Interval:  323 QTC Calculation: 479 R Axis:   -11 Text Interpretation:  Age not entered, assumed to be  31 years old for purpose of ECG interpretation Sinus tachycardia Borderline T abnormalities, inferior leads Borderline prolonged QT interval No old tracing to compare No ischemic changes Confirmed by Shaune Pollack [phone]) on 04/29/2017 10:11:51 AM       Radiology Dg Chest 2 View  Result Date: 04/29/2017 CLINICAL DATA:  Fever and dizziness for 1 day. EXAM: CHEST  2 VIEW COMPARISON:  None. FINDINGS: The heart size and  mediastinal contours are within normal limits. Both lungs are clear. The visualized skeletal structures are unremarkable. IMPRESSION: No active cardiopulmonary disease. Electronically Signed   By: Myles Rosenthal M.D.   On: 04/29/2017 11:20   Ct Head Wo Contrast  Result Date: 04/29/2017 CLINICAL DATA:  Pt c/o ha and fever since last night. EXAM: CT HEAD WITHOUT CONTRAST TECHNIQUE: Contiguous axial images were obtained from the base of the skull through the vertex without intravenous contrast. COMPARISON:  None. FINDINGS: Brain: No evidence of acute infarction, hemorrhage, hydrocephalus, extra-axial collection or mass lesion/mass effect. Vascular: No hyperdense vessel or unexpected calcification. Skull: Normal. Negative for fracture or focal lesion. Sinuses/Orbits: No acute finding. Other: None. IMPRESSION: 1. Negative for bleed or other acute intracranial process. Electronically Signed   By: Corlis Leak M.D.   On: 04/29/2017 11:51    Procedures .Critical Care Performed by: Shaune Pollack Authorized by: Shaune Pollack   Critical care provider statement:    Critical care time (minutes):  45   Critical care time was exclusive of:  Separately billable procedures and treating other patients and teaching time    Critical care was necessary to treat or prevent imminent or life-threatening deterioration of the following conditions:  Circulatory failure, sepsis, CNS failure or compromise and dehydration   Critical care was time spent personally by me on the following activities:  Development of treatment plan with patient or surrogate, discussions with consultants, evaluation of patient's response to treatment, examination of patient, obtaining history from patient or surrogate, ordering and performing treatments and interventions, ordering and review of laboratory studies, ordering and review of radiographic studies, pulse oximetry, re-evaluation of patient's condition and review of old charts   I assumed direction of critical care for this patient from another provider in my specialty: no   .Lumbar Puncture Date/Time: 04/29/2017 7:40 PM Performed by: Shaune Pollack Authorized by: Shaune Pollack   Consent:    Consent obtained:  Written   Consent given by:  Patient   Risks discussed:  Bleeding, headache, infection, nerve damage, pain and repeat procedure   Alternatives discussed:  Alternative treatment Universal protocol:    Patient identity confirmed:  Arm band Pre-procedure details:    Procedure purpose:  Diagnostic   Preparation: Patient was prepped and draped in usual sterile fashion   Sedation:    Sedation type:  Anxiolysis Anesthesia (see MAR for exact dosages):    Anesthesia method:  Local infiltration   Local anesthetic:  Lidocaine 1% w/o epi Procedure details:    Lumbar space:  L4-L5 interspace   Patient position:  L lateral decubitus   Needle gauge:  22   Needle type:  Spinal needle - Quincke tip   Needle length (in):  3.5   Number of attempts:  2   Fluid appearance:  Clear   Tubes of fluid:  4   Total volume (ml):  6 Post-procedure:    Puncture site:  Adhesive bandage applied and direct pressure applied   Patient tolerance of procedure:  Tolerated well, no immediate  complications   (including critical care time)  Medications Ordered in ED Medications  acyclovir (ZOVIRAX) 1,000 mg in dextrose 5 % 150 mL IVPB (0 mg Intravenous Stopped 04/29/17 1501)  vancomycin (VANCOCIN) IVPB 1000 mg/200 mL premix (not administered)  cefTRIAXone (ROCEPHIN) 2 g in dextrose 5 % 50 mL IVPB (not administered)  enoxaparin (LOVENOX) injection 40 mg (not administered)  sodium chloride flush (NS) 0.9 % injection 3 mL (not administered)  cloNIDine (CATAPRES) tablet 0.1  mg (not administered)  insulin aspart (novoLOG) injection 0-9 Units (not administered)  hydrALAZINE (APRESOLINE) injection 10 mg (not administered)  ipratropium-albuterol (DUONEB) 0.5-2.5 (3) MG/3ML nebulizer solution 3 mL (not administered)  benzonatate (TESSALON) capsule 100 mg (not administered)  sodium chloride 0.9 % bolus 1,000 mL (0 mLs Intravenous Stopped 04/29/17 1145)    And  sodium chloride 0.9 % bolus 500 mL (0 mLs Intravenous Stopped 04/29/17 1201)  dexamethasone (DECADRON) injection 10 mg (10 mg Intravenous Given 04/29/17 1026)  cefTRIAXone (ROCEPHIN) 2 g in dextrose 5 % 50 mL IVPB (0 g Intravenous Stopped 04/29/17 1121)  labetalol (NORMODYNE,TRANDATE) injection 10 mg (10 mg Intravenous Given 04/29/17 1025)  vancomycin (VANCOCIN) 2,500 mg in sodium chloride 0.9 % 500 mL IVPB (0 mg Intravenous Stopped 04/29/17 1549)  sodium chloride 0.9 % bolus 1,000 mL (0 mLs Intravenous Stopped 04/29/17 1201)  sodium chloride 0.9 % bolus 1,000 mL (0 mLs Intravenous Stopped 04/29/17 1201)  acetaminophen (TYLENOL) tablet 1,000 mg (1,000 mg Oral Given 04/29/17 1141)  LORazepam (ATIVAN) injection 1 mg (1 mg Intravenous Given 04/29/17 1244)  lidocaine (PF) (XYLOCAINE) 1 % injection 10 mL (10 mLs Intradermal Given 04/29/17 1333)  labetalol (NORMODYNE,TRANDATE) injection 20 mg (20 mg Intravenous Given 04/29/17 1156)  labetalol (NORMODYNE,TRANDATE) injection 20 mg (20 mg Intravenous Given 04/29/17 1420)  cloNIDine (CATAPRES) tablet  0.1 mg (0.1 mg Oral Given 04/29/17 1419)  lisinopril (PRINIVIL,ZESTRIL) tablet 10 mg (10 mg Oral Given 04/29/17 1419)     Initial Impression / Assessment and Plan / ED Course  I have reviewed the triage vital signs and the nursing notes.  Pertinent labs & imaging results that were available during my care of the patient were reviewed by me and considered in my medical decision making (see chart for details).     31 yo M with h/o poorly controlled HTN here with HA, AMS, and fever. On arrival, pt is febrile, tachycardic, hypertensive. Primary concern is encephalitis, possibly HSV. Must also consider meningitis, ICH with subsequent low grade temp. Will check stat CT Head. CODE SEPSIS initiated and given constellation of sx, will cover empirically with Vanc/Rocephin, Decadron x 1, as well as Acyclovir given c/f encephalitis. No seizure-like activity reported or observed at this time.  CT head neg. Labs otherwise as above, remarkable for lactic acidosis and leukocytosis, c/f acute infection. Pt remains markedly hypertensive requiring IV labetalol x 3, clonidine/multiple PO antihypertensive meds. He remains altered. Will perform LP.  LP performed as above with clear fluid. Bp improving after multiple antiHTN agents. Primary dx remains likely viral encephalitis/meningitis, though PRESS/HTN urgency remains on DDx, though leukocytosis and fever is more c/w infectious process. Pt has been covered. Admit to Family Med.  Final Clinical Impressions(s) / ED Diagnoses   Final diagnoses:  Fever  Hypertensive urgency  Encephalitis  Other elevated white blood cell (WBC) count  Lactic acidosis    New Prescriptions New Prescriptions   No medications on file     Shaune Pollack, MD 04/29/17 Bettye Boeck    Shaune Pollack, MD 04/29/17 1944

## 2017-04-29 NOTE — H&P (Signed)
Family Medicine Teaching [facility] Admission History and Physical Service Pager: [phone]  Patient name: Thomas Burch Medical record number: [id number] Date of birth: 1986/05/25 Age: 31 y.o. Gender: male  Primary Care Provider: Patient, No Pcp Per Consultants: none Code Status: Full  Chief Complaint: AMS  Assessment and Plan: Thomas Burch is a 31 y.o. male presenting with with AMS and low-grade fever. PMH is significant for poorly controlled hypertension obesity  Altered mental status:  CT head negative, elevated lactic acid to 2.97 upon presentation and leukocytosis to 18.4 with hyperglycemia to 374. No witnessed seizure-like activity and no known history.  UDS negative. LP performed in ED and empirically started on acyclovir, ceftriaxone and vancomycin and given dose of steroids for temperature to 100.4. Upon our assessment patient was alert and oriented 3, although had some word finding difficulty. No focal neurological deficits. Satting appropriately on room and has BUN WNL. No known head trauma and had CT head without any abnormalities. Spoke with patient's mother who stated that he has high functioning Asperger's and functions at the level of a 30 year old (of note patient does not know and mother has requested that we do not disclose this information). ED physician initiated sepsis protocol. Tolerating sips with meds well.  - Admit to med-surg under attending Jennette Kettle - Continue vancomycin and acyclovir and ceftriaxone, will keep for 48 hours and narrow as appropriate - Monitor neuro status every 2 hours - Lactate 2.97>2.47 - Vitals per floor protocol - TSH, hemoglobin A1C - CSF studies, pending - BMP, CBC in am - HIV screen - MRI brain in AM to r/o stroke  Hypertensive urgency with history of HTN: BP elevated to 223/127 upon presentation. Persistant tachycardia 100-120s. Was given labetalol, dose of lisinopril and clonidine. Endorses frontal HA without vision changes, chest  pain or SOB. History of poorly controlled hypertension. Chart review shows multiple urgent care visits for various reasons and SBP noted to be as high as 210. Med rec showing clonidine 0.1mg  BID, lopressor 25mg  BID and lisinopril and linsinopril-HCTZ combination. Patient reports only taking clonidine daily. No FND and no significant LE edema.  - lisinopril 10 mg  - hydralazine prn, 10 mg q4h, SBP >160/100  Cough Lungs diffusely rhonchorous and tight sounding with some intermittent scattered wheezing. Denies current or former tobacco use. Did grow up with parents who smoked. No known history of asthma. CXR showing no active pulmonary disease. Denies sick contacts, but lives with grandmother who watches children for a living.  - duonebs q4hrs PRN - tessalon perles  - droplet precaution  Hyperglycemia. CBG 346. UA > 500. No A1C on record.  - serial cbg checks - SSI - HH/Carb modified diet  FEN/GI: HH/Carb modified diet Prophylaxis: lovenox  Disposition: Admit to med-surg for observation  History of Present Illness:  Thomas Burch is a 31 y.o. male presenting with AMS started yesterday afternoon around lunch time. Predominant symptoms are word finding and confusion.  Denies any feelings of fever or chills.  Had diffuse headache last night associated with standing up that has since gone away.  No associated vision changes, CP, SOB, NV or diarrhea. No neck pain.  No changes with bowel or bladder habits.  Denies history of seizure activity or head trauma.  Initially seen in the ED and had negative CT head and had LP.  Does have significant stress at the moment. 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  No STD history or exposure.  Review  Of Systems: Per HPI   Review of Systems  Constitutional: Negative for chills and fever.  HENT: Negative for congestion.   Eyes: Negative for blurred vision.  Respiratory: Positive for cough. Negative for shortness of breath.    Cardiovascular: Positive for leg swelling. Negative for chest pain.  Gastrointestinal: Negative for abdominal pain, constipation, diarrhea, heartburn, nausea and vomiting.  Genitourinary: Negative for dysuria, frequency and urgency.  Musculoskeletal: Positive for myalgias.  Neurological: Positive for dizziness, speech change and headaches. Negative for focal weakness, seizures and loss of consciousness.  Psychiatric/Behavioral: Negative for depression and suicidal ideas.    Patient Active Problem List   Diagnosis Date Noted  . Altered mental status, unspecified 04/29/2017  . Elevated temperature   . Cough   . Hyperglycemia   . Hypertension 02/01/2014  . Obesity, Class II, BMI 35-39.9, with comorbidity 02/01/2014    Past Medical History: Past Medical History:  Diagnosis Date  . Hypertension     Past Surgical History: History reviewed. No pertinent surgical history.  Social History: Social History  Substance Use Topics  . Smoking status: Never Smoker  . Smokeless tobacco: Never Used  . Alcohol use Yes     Comment: 1 drink per week   Additional social history: Lives with Gearldine Shown, girlfriend and her child. Works in Information systems manager at [employer] Has not seen mother and father in 2 years. They were present at bedside. Reports lots of stress at home w/ girl friend who is divorcing her husband.  Denies alcohol drugs and cigarettes.  Please also refer to relevant sections of EMR.  Family History: Family History  Problem Relation Age of Onset  . Hypertension Father   . Stroke Father   . Hyperlipidemia Father   . Diabetes Maternal Grandmother    Allergies and Medications: No Known Allergies No current facility-administered medications on file prior to encounter.    Current Outpatient Prescriptions on File Prior to Encounter  Medication Sig Dispense Refill  . cloNIDine (CATAPRES) 0.1 MG tablet Take 1 tablet (0.1 mg total) by mouth 2 (two) times daily. 60 tablet 11     Objective: BP (!) 145/92   Pulse (!) 105   Temp (!) 100.4 F (38 C) (Oral)   Resp (!) 21   Ht 5\' 10"  (1.778 m)   Wt 240 lb (108.9 kg)   SpO2 91%   BMI 34.44 kg/m  Exam: General: nad, obese caucasian male Eyes: PERRL, EOMI ENTM: moist mucus membranes, tongue midline w/o deviation Neck: supple, no JVD Cardiovascular: sinus tachycardia, no mrg Respiratory: satting mid90s on 2L [place], bronchovesicular breath sounds in all lung fieldsd Gastrointestinal: nontender, mild distension, no HSM noted on exam MSK: trace lower extremity edema, 5/5 strength throughout Derm: skin intact, no observed ulcers Neuro: speaks with mild delay, able to communicate in full sentences, AxO4, cn ii-xii grossly intact, nl sensation throughout Psych: cooperative, appropriate  Labs and Imaging: CBC BMET   Recent Labs Lab 04/29/17 1005  WBC 18.4*  HGB 17.6*  HCT 49.7  PLT 280    Recent Labs Lab 04/29/17 1005  NA 133*  K 3.8  CL 95*  CO2 24  BUN 8  CREATININE 1.03  GLUCOSE 374*  CALCIUM 9.1     Garnette Gunner, MD 04/29/2017, 7:06 PM PGY-1, [facility] Family Medicine FPTS Intern pager: [phone], text pages welcome   FPTS Upper-Level Resident Addendum  I have independently interviewed and examined the patient. I have discussed the above with the original author and agree  with their documentation. My edits for correction/addition/clarification are in blue. Please see also any attending notes.   Elwanda Brooklyn, MD PGY-2, [facility] FPTS Service pager: [phone] (text pages welcome through [facility])

## 2017-04-29 NOTE — Progress Notes (Signed)
Pharmacy Antibiotic Note  Thomas Burch is a 31 y.o. male admitted on 04/29/2017 with sepsis/meningitis. Patient experiencing HA and AMS with progressive worsening as of last night.  Pharmacy has been consulted for vancomycin and ceftriaxone dosing.  BP 188/133, HR 130, Temp 100.4 F, RR 14, LA 2.97, glucose 374, nCrCl 100-105 ml/min.  Plan: Vancomycin 2500 mg IV load x 1, followed by Vancomycin gtt 1000 mg IV Q8hr  Ceftriaxone 2g IV Q12h Obtain vancomycin troughs as approriate Follow-up on clinical progression, Bcx, and descalation of antibiotics  Height: 5\' 10"  (177.8 cm) Weight: 240 lb (108.9 kg) IBW/kg (Calculated) : 73  Temp (24hrs), Avg:100.4 F (38 C), Min:100.4 F (38 C), Max:100.4 F (38 C)   Recent Labs Lab 04/29/17 1005 04/29/17 1016  WBC 18.4*  --   CREATININE 1.03  --   LATICACIDVEN  --  2.97*    Estimated Creatinine Clearance: 126.1 mL/min (by C-G formula based on SCr of 1.03 mg/dL).    No Known Allergies  Antimicrobials this admission: 8/25 Vanc >>  8/25 Ceftriaxone >>   Dose adjustments this admission:   Microbiology results: 8/25 BCx:   Adline Potter, PharmD Pharmacy Resident Pager: [phone]  04/29/2017 11:43 AM

## 2017-04-29 NOTE — ED Notes (Signed)
Pt to CT

## 2017-04-30 DIAGNOSIS — I160 Hypertensive urgency: Secondary | ICD-10-CM

## 2017-04-30 DIAGNOSIS — G0490 Encephalitis and encephalomyelitis, unspecified: Secondary | ICD-10-CM

## 2017-04-30 DIAGNOSIS — E872 Acidosis, unspecified: Secondary | ICD-10-CM

## 2017-04-30 DIAGNOSIS — R4182 Altered mental status, unspecified: Secondary | ICD-10-CM | POA: Diagnosis not present

## 2017-04-30 LAB — BLOOD CULTURE ID PANEL (REFLEXED)
ACINETOBACTER BAUMANNII: NOT DETECTED
CANDIDA ALBICANS: NOT DETECTED
CANDIDA GLABRATA: NOT DETECTED
CANDIDA KRUSEI: NOT DETECTED
CANDIDA PARAPSILOSIS: NOT DETECTED
CANDIDA TROPICALIS: NOT DETECTED
ENTEROBACTERIACEAE SPECIES: NOT DETECTED
ESCHERICHIA COLI: NOT DETECTED
Enterobacter cloacae complex: NOT DETECTED
Enterococcus species: NOT DETECTED
HAEMOPHILUS INFLUENZAE: NOT DETECTED
KLEBSIELLA OXYTOCA: NOT DETECTED
KLEBSIELLA PNEUMONIAE: NOT DETECTED
Listeria monocytogenes: NOT DETECTED
Methicillin resistance: NOT DETECTED
Neisseria meningitidis: NOT DETECTED
PSEUDOMONAS AERUGINOSA: NOT DETECTED
Proteus species: NOT DETECTED
STREPTOCOCCUS PNEUMONIAE: NOT DETECTED
STREPTOCOCCUS PYOGENES: NOT DETECTED
Serratia marcescens: NOT DETECTED
Staphylococcus aureus (BCID): NOT DETECTED
Staphylococcus species: DETECTED — AB
Streptococcus agalactiae: NOT DETECTED
Streptococcus species: NOT DETECTED

## 2017-04-30 LAB — GLUCOSE, CAPILLARY
GLUCOSE-CAPILLARY: 280 mg/dL — AB (ref 65–99)
GLUCOSE-CAPILLARY: 306 mg/dL — AB (ref 65–99)
Glucose-Capillary: 418 mg/dL — ABNORMAL HIGH (ref 65–99)
Glucose-Capillary: 217 mg/dL — ABNORMAL HIGH (ref 65–99)
Glucose-Capillary: 323 mg/dL — ABNORMAL HIGH (ref 65–99)

## 2017-04-30 LAB — CBC
HCT: 42.8 % (ref 39.0–52.0)
Hemoglobin: 14 g/dL (ref 13.0–17.0)
MCH: 28.9 pg (ref 26.0–34.0)
MCHC: 32.7 g/dL (ref 30.0–36.0)
MCV: 88.2 fL (ref 78.0–100.0)
Platelets: 265 10*3/uL (ref 150–400)
RBC: 4.85 MIL/uL (ref 4.22–5.81)
RDW: 13.3 % (ref 11.5–15.5)
WBC: 15.5 10*3/uL — ABNORMAL HIGH (ref 4.0–10.5)

## 2017-04-30 LAB — BASIC METABOLIC PANEL
Anion gap: 9 (ref 5–15)
BUN: 14 mg/dL (ref 6–20)
CO2: 25 mmol/L (ref 22–32)
Calcium: 8.6 mg/dL — ABNORMAL LOW (ref 8.9–10.3)
Chloride: 102 mmol/L (ref 101–111)
Creatinine, Ser: 0.96 mg/dL (ref 0.61–1.24)
GFR calc Af Amer: >60 mL/min (ref >60)
GLUCOSE: 421 mg/dL — AB (ref 65–99)
POTASSIUM: 3.4 mmol/L — AB (ref 3.5–5.1)
Sodium: 136 mmol/L (ref 135–145)

## 2017-04-30 LAB — HIV ANTIBODY (ROUTINE TESTING W REFLEX): HIV Screen 4th Generation wRfx: NONREACTIVE

## 2017-04-30 LAB — LACTIC ACID, PLASMA: LACTIC ACID, VENOUS: 2.7 mmol/L — AB (ref 0.5–1.9)

## 2017-04-30 MED ORDER — INSULIN ASPART 100 UNIT/ML ~~LOC~~ SOLN
7.0000 [IU] | Freq: Once | SUBCUTANEOUS | Status: AC
  Administered 2017-04-30: 7 [IU] via SUBCUTANEOUS

## 2017-04-30 MED ORDER — SODIUM CHLORIDE 0.9 % IV BOLUS (SEPSIS)
500.0000 mL | Freq: Once | INTRAVENOUS | Status: AC
  Administered 2017-04-30: 500 mL via INTRAVENOUS

## 2017-04-30 MED ORDER — INSULIN GLARGINE 100 UNIT/ML ~~LOC~~ SOLN
5.0000 [IU] | Freq: Every day | SUBCUTANEOUS | Status: DC
  Administered 2017-04-30 – 2017-05-01 (×2): 5 [IU] via SUBCUTANEOUS
  Filled 2017-04-30 (×2): qty 0.05

## 2017-04-30 MED ORDER — POTASSIUM CHLORIDE CRYS ER 20 MEQ PO TBCR
40.0000 meq | EXTENDED_RELEASE_TABLET | Freq: Once | ORAL | Status: AC
  Administered 2017-04-30: 40 meq via ORAL
  Filled 2017-04-30: qty 2

## 2017-04-30 MED ORDER — SODIUM CHLORIDE 0.9 % IV SOLN
INTRAVENOUS | Status: AC
  Administered 2017-04-30: 14:00:00 via INTRAVENOUS

## 2017-04-30 MED ORDER — LISINOPRIL 10 MG PO TABS
10.0000 mg | ORAL_TABLET | Freq: Every day | ORAL | Status: DC
  Administered 2017-04-30 – 2017-05-01 (×2): 10 mg via ORAL
  Filled 2017-04-30 (×2): qty 1

## 2017-04-30 NOTE — Progress Notes (Signed)
Family teaching medicine teaching service paged with the number of [phone] re this pt's elevated bs along with bp. Called back with revisions on some orders.

## 2017-04-30 NOTE — Progress Notes (Signed)
Pt started on bolus after hosp was informed of the elevated bs of this pt. He is to receive another dose of 7 units of insulin

## 2017-04-30 NOTE — Plan of Care (Signed)
Problem: Glucose Imbalance Goal: Knowledge of disease or condition and therapeutic regimen will improve New onset of diabetes Outcome: Progressing Pt is to be educated of the elevated blood sugar and its consequence if not treated

## 2017-04-30 NOTE — Progress Notes (Signed)
Report given to Loren,RN and bedside rounding done. Pt in nad. Will cont to monitor bs and cover per md's order

## 2017-04-30 NOTE — Progress Notes (Signed)
Report received from Loren,RN and bedside rounding done. Pt in nad and BS will be rechecked as ordered. No need expressed for now.

## 2017-04-30 NOTE — Progress Notes (Signed)
PHARMACY - PHYSICIAN COMMUNICATION CRITICAL VALUE ALERT - BLOOD CULTURE IDENTIFICATION (BCID)  Results for orders placed or performed during the hospital encounter of 04/29/17  Blood Culture ID Panel (Reflexed) (Collected: 04/29/2017 10:04 AM)  Result Value Ref Range   Enterococcus species NOT DETECTED NOT DETECTED   Listeria monocytogenes NOT DETECTED NOT DETECTED   Staphylococcus species DETECTED (A) NOT DETECTED   Staphylococcus aureus NOT DETECTED NOT DETECTED   Methicillin resistance NOT DETECTED NOT DETECTED   Streptococcus species NOT DETECTED NOT DETECTED   Streptococcus agalactiae NOT DETECTED NOT DETECTED   Streptococcus pneumoniae NOT DETECTED NOT DETECTED   Streptococcus pyogenes NOT DETECTED NOT DETECTED   Acinetobacter baumannii NOT DETECTED NOT DETECTED   Enterobacteriaceae species NOT DETECTED NOT DETECTED   Enterobacter cloacae complex NOT DETECTED NOT DETECTED   Escherichia coli NOT DETECTED NOT DETECTED   Klebsiella oxytoca NOT DETECTED NOT DETECTED   Klebsiella pneumoniae NOT DETECTED NOT DETECTED   Proteus species NOT DETECTED NOT DETECTED   Serratia marcescens NOT DETECTED NOT DETECTED   Haemophilus influenzae NOT DETECTED NOT DETECTED   Neisseria meningitidis NOT DETECTED NOT DETECTED   Pseudomonas aeruginosa NOT DETECTED NOT DETECTED   Candida albicans NOT DETECTED NOT DETECTED   Candida glabrata NOT DETECTED NOT DETECTED   Candida krusei NOT DETECTED NOT DETECTED   Candida parapsilosis NOT DETECTED NOT DETECTED   Candida tropicalis NOT DETECTED NOT DETECTED    Name of physician (or Provider) Contacted: Family Medicine Teaching Serivce - Dr. Mosetta Putt  Changes to prescribed antibiotics required: currently on vancomycin, acyclovir, and ceftriaxone for altered mental status, de-escalate as clinically indicated  Carylon Perches, PharmD PGY2 Oncology Pharmacy Resident  Pharmacy Phone: [phone] 04/30/2017

## 2017-04-30 NOTE — Progress Notes (Signed)
Family Medicine Teaching Service Daily Progress Note Intern Pager: [phone]  Patient name: Thomas Burch Medical record number: [id number] Date of birth: 12-17-1985 Age: 31 y.o. Gender: male  Primary Care Provider: Patient, No Pcp Per Consultants: None Code Status: Full  Pt Overview and Major Events to Date:  8/25 admit for AMS, initiate broad spectrum abx coverage for ?meningitis. CT head negative  Assessment and Plan: Thomas Burch is a 31 y.o. male presenting with with AMS and low-grade fever. PMH is significant for poorly controlled hypertension obesity  Altered mental status:  Uncertain etiology, uncertain baseline. Considering infectious cause vs. Hypertensive encephalopathy given initial pressures 223/127 on admit. Per report presented with word-finding difficulties and confusion, otherwise non-focal neuro exam. LP to r/o meningitis with empiric coverage until cultures result. Prelim CSF with protein 24 (WNL), glucose elevated at 166, and gram stain with no organisms seen, culture to follow. CT head negative for acute bleed. - follow CSF cultures, NGTD - BCID with 1/2 vials positive for staph species, likely contaminant - continue day #2 vanc, acyclovir and ceftriaxone (8/25 - ) >>consider de-escalating given gram stain without organisms, and patient well-appearing, not likely bacterial meningitis - follow up MRI brain for 8/26 - trend lactate until WNL , 2.97>2.47  Diabetes, diagnosed this admission.  A1c 11.9, CBGs 380, 418 overnight. Required 14 units sliding scale. Patient did receive steroids as well as noted above. - serial cbg checks - SSI - HH/Carb modified diet - will add 5u lantus, titrate as needed - will require diabetes teaching  HTN with hypertensive emergency, now controlled: BP elevated to 223/127 on admit, improved to 120/71 this AM. Med rec showing clonidine 0.1mg  BID, lopressor 25mg  BID and lisinopril and linsinopril-HCTZ combination. Patient reports only  taking clonidine daily.  - continue home clonidine 0.1 mg BID - continue lisinopril 10 mg  - hydralazine prn, 10 mg q4h, SBP >160/100  Cough, stable - CXR showing no active pulmonary disease. Denies sick contacts, but lives with grandmother who watches children for a living.  - duonebs q4hrs PRN - tessalon perles  - droplet precaution  Hypokalemia, mild - 3.4, replete with 40 mEq KDUR  ?hx autism - per report from patient's mother on admission. Reportedly patient is unaware of this diagnosis.  FEN/GI: HH/Carb modified diet Prophylaxis: lovenox  Disposition: home when medically stable  Subjective:  Patient up and walking around when I entered the room today. Conversing normally. AMS seems to have resolved. Discussed new diagnosis of diabetes.  Objective: Temp:  [98.5 F (36.9 C)-100.4 F (38 C)] 98.5 F (36.9 C) (08/26 0443) Pulse Rate:  [101-144] 101 (08/26 0443) Resp:  [14-29] 18 (08/26 0443) BP: (120-223)/(71-149) 120/71 (08/26 0443) SpO2:  [91 %-99 %] 95 % (08/26 0443) Weight:  [240 lb (108.9 kg)-246 lb 12.8 oz (111.9 kg)] 246 lb 12.8 oz (111.9 kg) (08/25 2132) Physical Exam: General: NAD, well-appearing Cardiovascular: RRR, no m/r/g Respiratory: CTA bil Abdomen: soft and nontender Extremities: non edematous Neuro: CN II-XII intact, 5+ strength in 4 extremities, AAOx3  Laboratory:  Recent Labs Lab 04/29/17 1005 04/30/17 0157  WBC 18.4* 15.5*  HGB 17.6* 14.0  HCT 49.7 42.8  PLT 280 265    Recent Labs Lab 04/29/17 1005 04/29/17 2139 04/30/17 0157  NA 133*  --  136  K 3.8  --  3.4*  CL 95*  --  102  CO2 24  --  25  BUN 8  --  14  CREATININE 1.03 1.09 0.96  CALCIUM  9.1  --  8.6*  PROT 7.2  --   --   BILITOT 1.3*  --   --   ALKPHOS 98  --   --   ALT 65*  --   --   AST 39  --   --   GLUCOSE 374*  --  421*   TSH 0.493 A1c 11.9  Imaging/Diagnostic Tests: Dg Chest 2 View 04/29/2017 . FINDINGS: The heart size and mediastinal contours are within  normal limits. Both lungs are clear. The visualized skeletal structures are unremarkable.  IMPRESSION: No active cardiopulmonary disease.   Ct Head Wo Contrast 04/29/2017 FINDINGS: Brain: No evidence of acute infarction, hemorrhage, hydrocephalus, extra-axial collection or mass lesion/mass effect. Vascular: No hyperdense vessel or unexpected calcification. Skull: Normal. Negative for fracture or focal lesion. Sinuses/Orbits: No acute finding. Other: None.  IMPRESSION:  1. Negative for bleed or other acute intracranial process.    Howard Pouch, MD 04/30/2017, 7:52 AM PGY-2, [facility] FPTS Intern pager: [phone], text pages welcome

## 2017-05-01 ENCOUNTER — Observation Stay (HOSPITAL_COMMUNITY): Payer: Commercial Managed Care - PPO

## 2017-05-01 DIAGNOSIS — I5030 Unspecified diastolic (congestive) heart failure: Secondary | ICD-10-CM | POA: Diagnosis present

## 2017-05-01 DIAGNOSIS — R29700 NIHSS score 0: Secondary | ICD-10-CM | POA: Diagnosis present

## 2017-05-01 DIAGNOSIS — Z79899 Other long term (current) drug therapy: Secondary | ICD-10-CM | POA: Diagnosis not present

## 2017-05-01 DIAGNOSIS — G43909 Migraine, unspecified, not intractable, without status migrainosus: Secondary | ICD-10-CM | POA: Diagnosis present

## 2017-05-01 DIAGNOSIS — R4182 Altered mental status, unspecified: Secondary | ICD-10-CM | POA: Diagnosis not present

## 2017-05-01 DIAGNOSIS — D72829 Elevated white blood cell count, unspecified: Secondary | ICD-10-CM | POA: Diagnosis not present

## 2017-05-01 DIAGNOSIS — E669 Obesity, unspecified: Secondary | ICD-10-CM | POA: Diagnosis present

## 2017-05-01 DIAGNOSIS — G0490 Encephalitis and encephalomyelitis, unspecified: Secondary | ICD-10-CM | POA: Diagnosis not present

## 2017-05-01 DIAGNOSIS — I160 Hypertensive urgency: Secondary | ICD-10-CM | POA: Diagnosis not present

## 2017-05-01 DIAGNOSIS — I6529 Occlusion and stenosis of unspecified carotid artery: Secondary | ICD-10-CM | POA: Diagnosis present

## 2017-05-01 DIAGNOSIS — I674 Hypertensive encephalopathy: Secondary | ICD-10-CM | POA: Diagnosis present

## 2017-05-01 DIAGNOSIS — I6340 Cerebral infarction due to embolism of unspecified cerebral artery: Secondary | ICD-10-CM | POA: Diagnosis present

## 2017-05-01 DIAGNOSIS — E1059 Type 1 diabetes mellitus with other circulatory complications: Secondary | ICD-10-CM | POA: Diagnosis not present

## 2017-05-01 DIAGNOSIS — F840 Autistic disorder: Secondary | ICD-10-CM | POA: Diagnosis present

## 2017-05-01 DIAGNOSIS — I161 Hypertensive emergency: Secondary | ICD-10-CM | POA: Diagnosis present

## 2017-05-01 DIAGNOSIS — Z823 Family history of stroke: Secondary | ICD-10-CM | POA: Diagnosis not present

## 2017-05-01 DIAGNOSIS — Z6834 Body mass index (BMI) 34.0-34.9, adult: Secondary | ICD-10-CM | POA: Diagnosis not present

## 2017-05-01 DIAGNOSIS — D72828 Other elevated white blood cell count: Secondary | ICD-10-CM | POA: Diagnosis not present

## 2017-05-01 DIAGNOSIS — E118 Type 2 diabetes mellitus with unspecified complications: Secondary | ICD-10-CM | POA: Diagnosis not present

## 2017-05-01 DIAGNOSIS — I639 Cerebral infarction, unspecified: Secondary | ICD-10-CM | POA: Diagnosis not present

## 2017-05-01 DIAGNOSIS — E785 Hyperlipidemia, unspecified: Secondary | ICD-10-CM | POA: Diagnosis not present

## 2017-05-01 DIAGNOSIS — E1065 Type 1 diabetes mellitus with hyperglycemia: Secondary | ICD-10-CM | POA: Diagnosis present

## 2017-05-01 DIAGNOSIS — R05 Cough: Secondary | ICD-10-CM | POA: Diagnosis present

## 2017-05-01 DIAGNOSIS — E876 Hypokalemia: Secondary | ICD-10-CM | POA: Diagnosis present

## 2017-05-01 DIAGNOSIS — Z9114 Patient's other noncompliance with medication regimen: Secondary | ICD-10-CM | POA: Diagnosis not present

## 2017-05-01 DIAGNOSIS — I638 Other cerebral infarction: Secondary | ICD-10-CM | POA: Diagnosis not present

## 2017-05-01 DIAGNOSIS — I110 Hypertensive heart disease with heart failure: Secondary | ICD-10-CM | POA: Diagnosis present

## 2017-05-01 DIAGNOSIS — E119 Type 2 diabetes mellitus without complications: Secondary | ICD-10-CM | POA: Diagnosis not present

## 2017-05-01 LAB — BASIC METABOLIC PANEL
Anion gap: 9 (ref 5–15)
BUN: 11 mg/dL (ref 6–20)
CALCIUM: 8.7 mg/dL — AB (ref 8.9–10.3)
CO2: 28 mmol/L (ref 22–32)
CREATININE: 0.98 mg/dL (ref 0.61–1.24)
Chloride: 100 mmol/L — ABNORMAL LOW (ref 101–111)
GFR calc Af Amer: >60 mL/min (ref >60)
GFR calc non Af Amer: >60 mL/min (ref >60)
Glucose, Bld: 287 mg/dL — ABNORMAL HIGH (ref 65–99)
POTASSIUM: 4.2 mmol/L (ref 3.5–5.1)
SODIUM: 137 mmol/L (ref 135–145)

## 2017-05-01 LAB — CULTURE, BLOOD (ROUTINE X 2): SPECIAL REQUESTS: ADEQUATE

## 2017-05-01 LAB — CBC
HCT: 48.9 % (ref 39.0–52.0)
Hemoglobin: 15.9 g/dL (ref 13.0–17.0)
MCH: 28.9 pg (ref 26.0–34.0)
MCHC: 32.5 g/dL (ref 30.0–36.0)
MCV: 88.9 fL (ref 78.0–100.0)
PLATELETS: 245 10*3/uL (ref 150–400)
RBC: 5.5 MIL/uL (ref 4.22–5.81)
RDW: 13.4 % (ref 11.5–15.5)
WBC: 15.9 10*3/uL — AB (ref 4.0–10.5)

## 2017-05-01 LAB — HERPES SIMPLEX VIRUS(HSV) DNA BY PCR
HSV 1 DNA: NEGATIVE
HSV 2 DNA: NEGATIVE

## 2017-05-01 LAB — HSV CULTURE AND TYPING

## 2017-05-01 LAB — GLUCOSE, CAPILLARY
GLUCOSE-CAPILLARY: 300 mg/dL — AB (ref 65–99)
Glucose-Capillary: 361 mg/dL — ABNORMAL HIGH (ref 65–99)
Glucose-Capillary: 263 mg/dL — ABNORMAL HIGH (ref 65–99)
Glucose-Capillary: 195 mg/dL — ABNORMAL HIGH (ref 65–99)

## 2017-05-01 LAB — LACTIC ACID, PLASMA
LACTIC ACID, VENOUS: 1.2 mmol/L (ref 0.5–1.9)
Lactic Acid, Venous: 1.7 mmol/L (ref 0.5–1.9)

## 2017-05-01 MED ORDER — INSULIN GLARGINE 100 UNIT/ML ~~LOC~~ SOLN
10.0000 [IU] | Freq: Every day | SUBCUTANEOUS | Status: DC
  Administered 2017-05-02: 10 [IU] via SUBCUTANEOUS
  Filled 2017-05-01 (×2): qty 0.1

## 2017-05-01 MED ORDER — LORAZEPAM 1 MG PO TABS: 1.0000 mg | ORAL_TABLET | Freq: Once | ORAL | Status: DC

## 2017-05-01 MED ORDER — MELATONIN 3 MG PO TABS
6.0000 mg | ORAL_TABLET | Freq: Every evening | ORAL | Status: DC | PRN
  Administered 2017-05-01: 6 mg via ORAL
  Filled 2017-05-01 (×2): qty 2

## 2017-05-01 MED ORDER — ACETAMINOPHEN 325 MG PO TABS
650.0000 mg | ORAL_TABLET | Freq: Four times a day (QID) | ORAL | Status: DC | PRN
  Administered 2017-05-01 – 2017-05-02 (×3): 650 mg via ORAL
  Filled 2017-05-01 (×3): qty 2

## 2017-05-01 MED ORDER — LIVING WELL WITH DIABETES BOOK
Freq: Once | Status: AC
Start: 2017-05-01 — End: 2017-05-01
  Administered 2017-05-01: 09:00:00
  Filled 2017-05-01: qty 1

## 2017-05-01 MED ORDER — LISINOPRIL 20 MG PO TABS: 20.0000 mg | ORAL_TABLET | Freq: Every day | ORAL | Status: DC

## 2017-05-01 MED ORDER — LISINOPRIL 10 MG PO TABS
10.0000 mg | ORAL_TABLET | Freq: Once | ORAL | Status: AC
  Administered 2017-05-01: 10 mg via ORAL
  Filled 2017-05-01: qty 1

## 2017-05-01 MED ORDER — LORAZEPAM 2 MG/ML IJ SOLN
1.0000 mg | Freq: Once | INTRAMUSCULAR | Status: AC
  Administered 2017-05-02: 1 mg via INTRAVENOUS
  Filled 2017-05-01: qty 1

## 2017-05-01 NOTE — Progress Notes (Signed)
Family Medicine Teaching Service Daily Progress Note Intern Pager: [phone]  Patient name: Thomas Burch Medical record number: [id number] Date of birth: 03-Jan-1986 Age: 31 y.o. Gender: male  Primary Care Provider: Patient, No Pcp Per Consultants: None Code Status: Full  Pt Overview and Major Events to Date:  8/25 admit for AMS, initiate broad spectrum abx coverage for ?meningitis. CT head negative  Assessment and Plan: Thomas Burch is a 31 y.o. male presenting with with AMS and low-grade fever. PMH is significant for poorly controlled hypertension obesity  Altered mental status:  Uncertain etiology, uncertain baseline. Considering infectious cause vs. hypertensive encephalopathy given initial pressures 223/127 on admit. Per report presented with word-finding difficulties and confusion, otherwise non-focal neuro exam. LP to r/o meningitis with empiric coverage until cultures result. Prelim CSF with protein 24 (WNL), glucose elevated at 166, and gram stain with no organisms seen, culture to follow. CT head negative for acute bleed. - follow CSF cultures, NGTD - BCID with 1/2 vials positive for staph species, likely contaminant - continue day 3 vanc, acyclovir and ceftriaxone (8/25 - )  - follow up MRI brain for 8/27 - trend lactate until WNL , 2.97>1.7 8/27 s/p fluids  - Tylenol given prn for headache  Diabetes, diagnosed this admission.  A1c 11.9, CBGs 217, 323 overnight. Required 22 units sliding scale, 5 U lantus.  - serial cbg checks - SSI - C-peptide pending - HH/Carb modified diet - will require diabetes teaching- plan to send home with oral medications and close follow up depending on C-peptide results  HTN, improved: Hypertensive emergency on admit, improved to 151/111 this AM. Med rec showing clonidine 0.1mg  BID, lopressor 25mg  BID and linsinopril-HCTZ combination. Patient reports only taking clonidine daily.  - continue home clonidine 0.1 mg BID - continue lisinopril 10  mg, increase to 20mg  daily - hydralazine prn, 10 mg q4h, SBP >160/100  Cough, improved - CXR showing no active pulmonary disease.  - duonebs q4hrs PRN - tessalon perles  - droplet precaution  Hypokalemia, resolved. 4.2 8/27 s/p 40 Kdur  ?hx autism - per report from patient's mother on admission. Reportedly patient is unaware of this diagnosis.  FEN/GI: HH/Carb modified diet Prophylaxis: lovenox  Disposition: home when medically stable  Subjective:  Patient reports cough is better. Complaining of a headache. Told MRI may not be done today. Getting restless in his room.   Objective: Temp:  [97.8 F (36.6 C)-97.9 F (36.6 C)] 97.8 F (36.6 C) (08/27 0419) Pulse Rate:  [104-111] 111 (08/27 0419) Resp:  [18] 18 (08/27 0419) BP: (140-165)/(101-115) 151/111 (08/27 0419) SpO2:  [99 %] 99 % (08/27 0419) Physical Exam: General: NAD, well-appearing Cardiovascular: RRR, no m/r/g Respiratory: CTA bil Abdomen: soft and nontender Extremities: non edematous Neuro: CN II-XII intact, AAOx3  Laboratory:  Recent Labs Lab 04/29/17 1005 04/30/17 0157 05/01/17 0546  WBC 18.4* 15.5* 15.9*  HGB 17.6* 14.0 15.9  HCT 49.7 42.8 48.9  PLT 280 265 245    Recent Labs Lab 04/29/17 1005 04/29/17 2139 04/30/17 0157 05/01/17 0546  NA 133*  --  136 137  K 3.8  --  3.4* 4.2  CL 95*  --  102 100*  CO2 24  --  25 28  BUN 8  --  14 11  CREATININE 1.03 1.09 0.96 0.98  CALCIUM 9.1  --  8.6* 8.7*  PROT 7.2  --   --   --   BILITOT 1.3*  --   --   --  ALKPHOS 98  --   --   --   ALT 65*  --   --   --   AST 39  --   --   --   GLUCOSE 374*  --  421* 287*   TSH 0.493 A1c 11.9  Imaging/Diagnostic Tests: Dg Chest 2 View 04/29/2017 . FINDINGS: The heart size and mediastinal contours are within normal limits. Both lungs are clear. The visualized skeletal structures are unremarkable.  IMPRESSION: No active cardiopulmonary disease.   Ct Head Wo Contrast 04/29/2017 FINDINGS: Brain: No evidence  of acute infarction, hemorrhage, hydrocephalus, extra-axial collection or mass lesion/mass effect. Vascular: No hyperdense vessel or unexpected calcification. Skull: Normal. Negative for fracture or focal lesion. Sinuses/Orbits: No acute finding. Other: None.  IMPRESSION:  1. Negative for bleed or other acute intracranial process.    Thomas Burch, [place], DO 05/01/2017, 10:56 AM PGY-1, [facility] FPTS Intern pager: [phone], text pages welcome

## 2017-05-01 NOTE — Discharge Summary (Signed)
Thomas Burch  Patient name: Thomas Burch Medical record number: [id number] Date of birth: 1986-06-27 Age: 31 y.o. Gender: male Date of Admission: 04/29/2017  Date of Discharge: 05/05/17  Admitting Physician: Dickie La, MD  Primary Care Provider: [place], [place], DO Consultants: None  Indication for Hospitalization: AMS and hypertensive urgency  Discharge Diagnoses/Problem List:  Embolic stroke New Onset Type II Diabetes Mellitus Hypertension HFpEF Hypokalemia  Disposition: Home  Discharge Condition: Stable  Discharge Exam:  General: NAD, well-appearing Cardiovascular: RRR, no m/r/g Respiratory: CTA bil Abdomen: soft and nontender Extremities: non-edematous Neuro: A&Ox3  Brief Hospital Course:  Thomas Burch is a 31 y.o. male who presented to the ED with AMS with predominately symptoms of word finding and confusion. CT head negative, elevated lactic acid to 2.97 upon presentation and leukocytosis to 18.4 with hyperglycemia to 374. No witnessed seizure-like activity and no known history.  UDS negative. LP performed in ED and empirically started on acyclovir, ceftriaxone and vancomycin and given dose of steroids for temperature to 100.4.  On day 2 of hospital stay his CSF culture came back negative for bacteria and viral etiologies. LA was 1.2, and antibiotics and acyclovir were discontinued. Patient received an MRI on 05/02/2017 showing a subcentimeter acute infarct in L occipital cortex, small subacute infarcts post R cerebrum, mild small vessel ischemia. Neurology was consulted and did a full work up. Further testing included: CTA Neck showing mild atherosclerosis; CTA Head showing no large vessel occlusion. Severe stenosis R proximal P2 segment. Moderate stenosis R P1 segment. Mild cerebral artery luminal irregularity compatible with atherosclerosis. Neurology also obtained a carotid doppler which came back normal, a TEE which  revealed no valve abnormality, and an ECHO which showed an EF of 50-55% with a grade 2 diastolic dysfunction. Neurology recommended medical management for the severe stenosis of his R P2 segment given that he is asymptomatic. Neurology also recommended a 30 day heart monitor to be obtained in the outpatient setting.   While in the hospital, the patient was diagnosed with new onset Diabetes mellitus.  A1c on admission was 11.9 with a glucose of 421. C-peptide resulted as normal. Patient received increasing doses of Lantus, along with sliding scale insulin while admitted. He is being sent home with 13 U of Lantus to take every morning. No meal coverage at this time. Will need to be started on Metformin as an outpatient. He was encouraged to change his diet and met with a diabetic counselor while admitted. He may need follow up with a diabetic educator in the outpatient setting,.   Patient was only taking 0.1 clonidine BID at home for blood pressure management prior to admission. Given that he presented with hypertensive urgency, he was started on Lisinopril 40 mg and HCTZ 25 mg in addition to his clonidine. His pressures were still elevated while admitted, as well as his heart rate. Patient was tachycardic in the 110's during most of his admission. Patient consider the addition of a beta blocker further out from his CVA. This was discussed with the patient.    Issues for Follow Up:  1. CVA: Ensure compliance with ASA 325 mg, Plavix 75 mg and atorvastatin 80 mg for recent embolic stroke.  2. Needs to follow up with cardiology for an outpatient 30 day heart monitor recommended by neurology.  3. Diabetes: Follow up with PCP for new onset diabetes mellitus. Patient will need guidance with management. Being sent home with insulin pen on 13 U  of Lantus daily. Patient will also need to start Metformin 500 mg BID.  4. Follow up with a diabetes educator. 5. HTN: Patient will need follow up for uncontrolled  hypertension. Non-compliant with medications in the past. Ensure patient is compliant with medications and adjust appropriately. Sent home with clonidine 0.2 mg BID, Lisinopril 40 mg daily, and HCTZ 25 mg daily. Consider adding a beta blocker to also help with his tachycardia. Encourage to decrease sodium in diet.  6. Hypokalemia: Monitor with BMP at follow up PCP.  7. HFpEF: New diagnosed from ECHO. Patient not requiring Lasix. Continue to monitor fluid status and assess for optical medical management.  Significant Procedures: none  Significant Labs and Imaging:   Recent Labs Lab 05/02/17 0443 05/03/17 0352 05/04/17 0359  WBC 13.3* 14.1* 15.3*  HGB 15.8 16.6 17.0  HCT 48.1 49.1 51.1  PLT 269 263 287    Recent Labs Lab 04/29/17 1005  04/30/17 0157 05/01/17 0546 05/02/17 0432 05/03/17 0352 05/04/17 0359  NA 133*  --  136 137 136 134* 136  K 3.8  --  3.4* 4.2 3.2* 3.3* 3.5  CL 95*  --  102 100* 98* 98* 99*  CO2 24  --  25 28 28 24 27   GLUCOSE 374*  --  421* 287* 240* 239* 166*  BUN 8  --  14 11 9 11 15   CREATININE 1.03  < > 0.96 0.98 0.87 0.90 0.93  CALCIUM 9.1  --  8.6* 8.7* 8.9 8.9 9.4  ALKPHOS 98  --   --   --   --   --   --   AST 39  --   --   --   --   --   --   ALT 65*  --   --   --   --   --   --   ALBUMIN 4.1  --   --   --   --   --   --   < > = values in this interval not displayed.   TSH 0.493 Lipid panel: LDL 115, TG 178, HDL 34, Tot Chol 185 A1c 11.9 C-peptide 3.4  TEE: Left Ventrical:Low normal LV function - EF 45-50% Mitral Valve:normal MV, no vegetation. Aortic Valve:normal valve, no vegetation Tricuspid Valve: normal structure. Trivial TR , no vegetation  Pulmonic Valve: normal  Left Atrium/ Left atrial appendage:no thrombi  Atrial septum:no evidence of PFO or ASD by color flow or bubble study  Aorta:normal aorta  ECHO: Study Conclusions  - Left ventricle: Wall thickness was increased in a pattern of moderate LVH. There was focal  basal hypertrophy. Systolic function was normal. The estimated ejection fraction was in the range of 50% to 55%. Features are consistent with a pseudonormal left ventricular filling pattern, with concomitant abnormal relaxation and increased filling pressure (grade 2 diastolic dysfunction). - Aortic valve: Valve area (Vmax): 3.01 cm^2.  Ct Angio Head W Or Wo Contrast  Result Date: 05/03/2017 EXAM: CT ANGIOGRAPHY HEAD AND NECK TECHNIQUE: Multidetector CT imaging of the head and neck was performed using the standard protocol during bolus administration of intravenous contrast. Multiplanar CT image reconstructions and MIPs were obtained to evaluate the vascular anatomy. Carotid stenosis measurements (when applicable) are obtained utilizing NASCET criteria, using the distal internal carotid diameter as the denominator. CONTRAST:  50 cc Isovue 370 COMPARISON:  MRI of the head May 02, 2017 and CT HEAD April 29, 2017 FINDINGS: CT HEAD FINDINGS BRAIN: No intraparenchymal hemorrhage, mass  effect nor midline shift. Patchy supratentorial white matter are type with densities. The ventricles and sulci are mildly prominent for age. No acute large vascular territory infarcts. No abnormal extra-axial fluid collections. Basal cisterns are patent. VASCULAR: Unremarkable. SKULL/SOFT TISSUES: No skull fracture. No significant soft tissue swelling. ORBITS/SINUSES: The included ocular globes and orbital contents are normal.The mastoid aircells and included paranasal sinuses are well-aerated. Thomas: None. CTA NECK AORTIC ARCH: Normal appearance of the thoracic arch, 2 vessel arch is a normal variant. The origins of the innominate, left Common carotid artery and subclavian artery are widely patent. RIGHT CAROTID SYSTEM: Common carotid artery is widely patent, coursing in a straight line fashion. Mild eccentric calcific atherosclerosis of the carotid bifurcation without hemodynamically significant stenosis by  NASCET criteria. Normal appearance of the included internal carotid artery. LEFT CAROTID SYSTEM: Common carotid artery is widely patent, coursing in a straight line fashion. Mild eccentric calcific atherosclerosis of the carotid bifurcation without hemodynamically significant stenosis by NASCET criteria. Normal appearance of the included internal carotid artery. VERTEBRAL ARTERIES:The codominant vertebral artery's. Normal appearance of the vertebral arteries, which appear widely patent. SKELETON: No acute osseous process though bone windows have not been submitted. Thomas NECK: Soft tissues of the neck are nonacute though, not tailored for evaluation. UPPER CHEST: Included lung apices are clear. No superior mediastinal lymphadenopathy. CTA HEAD ANTERIOR CIRCULATION: Patent cervical internal carotid arteries, petrous, cavernous and supra clinoid internal carotid arteries. Widely patent anterior communicating artery. Patent anterior and middle cerebral arteries. Mild luminal irregularity bilateral mid to distal middle cerebral artery's. No large vessel occlusion, significant stenosis, contrast extravasation or aneurysm. POSTERIOR CIRCULATION: Patent vertebral arteries, vertebrobasilar junction and basilar artery, as well as main branch vessels. Patent posterior cerebral arteries. Severe stenosis RIGHT proximal P2 segment, moderate stenosis RIGHT mid P1 segment. Mild luminal irregularity LEFT posterior cerebral artery. No large vessel occlusion, contrast extravasation or aneurysm. VENOUS SINUSES: Major dural venous sinuses are patent though not tailored for evaluation on this angiographic examination. ANATOMIC VARIANTS: None. DELAYED PHASE: No abnormal intracranial enhanced. MIP images reviewed. IMPRESSION: CT HEAD: 1. No acute intracranial process ; patient's known acute small infarcts not apparent by CT. 2. Mild chronic small vessel ischemic disease. 3. Borderline parenchymal brain volume loss for age. CTA NECK: 1.  Mild atherosclerosis without hemodynamically significant stenosis or acute vascular process. CTA HEAD: 1. No emergent large vessel occlusion. 2. Severe stenosis RIGHT proximal P2 segment. Moderate stenosis RIGHT P1 segment. 3. Mild cerebral artery luminal irregularity compatible with atherosclerosis. Electronically Signed   By: Elon Alas M.D.   On: 05/03/2017 02:04    Ct Angio Neck W Or Wo Contrast  Result Date: 05/03/2017 EXAM: CT ANGIOGRAPHY HEAD AND NECK TECHNIQUE: Multidetector CT imaging of the head and neck was performed using the standard protocol during bolus administration of intravenous contrast. Multiplanar CT image reconstructions and MIPs were obtained to evaluate the vascular anatomy. Carotid stenosis measurements (when applicable) are obtained utilizing NASCET criteria, using the distal internal carotid diameter as the denominator. CONTRAST:  50 cc Isovue 370 COMPARISON:  MRI of the head May 02, 2017 and CT HEAD April 29, 2017 FINDINGS: CT HEAD FINDINGS BRAIN: No intraparenchymal hemorrhage, mass effect nor midline shift. Patchy supratentorial white matter are type with densities. The ventricles and sulci are mildly prominent for age. No acute large vascular territory infarcts. No abnormal extra-axial fluid collections. Basal cisterns are patent. VASCULAR: Unremarkable. SKULL/SOFT TISSUES: No skull fracture. No significant soft tissue swelling. ORBITS/SINUSES: The included ocular globes and  orbital contents are normal.The mastoid aircells and included paranasal sinuses are well-aerated. Thomas: None. CTA NECK AORTIC ARCH: Normal appearance of the thoracic arch, 2 vessel arch is a normal variant. The origins of the innominate, left Common carotid artery and subclavian artery are widely patent. RIGHT CAROTID SYSTEM: Common carotid artery is widely patent, coursing in a straight line fashion. Mild eccentric calcific atherosclerosis of the carotid bifurcation without hemodynamically  significant stenosis by NASCET criteria. Normal appearance of the included internal carotid artery. LEFT CAROTID SYSTEM: Common carotid artery is widely patent, coursing in a straight line fashion. Mild eccentric calcific atherosclerosis of the carotid bifurcation without hemodynamically significant stenosis by NASCET criteria. Normal appearance of the included internal carotid artery. VERTEBRAL ARTERIES:The codominant vertebral artery's. Normal appearance of the vertebral arteries, which appear widely patent. SKELETON: No acute osseous process though bone windows have not been submitted. Thomas NECK: Soft tissues of the neck are nonacute though, not tailored for evaluation. UPPER CHEST: Included lung apices are clear. No superior mediastinal lymphadenopathy. CTA HEAD ANTERIOR CIRCULATION: Patent cervical internal carotid arteries, petrous, cavernous and supra clinoid internal carotid arteries. Widely patent anterior communicating artery. Patent anterior and middle cerebral arteries. Mild luminal irregularity bilateral mid to distal middle cerebral artery's. No large vessel occlusion, significant stenosis, contrast extravasation or aneurysm. POSTERIOR CIRCULATION: Patent vertebral arteries, vertebrobasilar junction and basilar artery, as well as main branch vessels. Patent posterior cerebral arteries. Severe stenosis RIGHT proximal P2 segment, moderate stenosis RIGHT mid P1 segment. Mild luminal irregularity LEFT posterior cerebral artery. No large vessel occlusion, contrast extravasation or aneurysm. VENOUS SINUSES: Major dural venous sinuses are patent though not tailored for evaluation on this angiographic examination. ANATOMIC VARIANTS: None. DELAYED PHASE: No abnormal intracranial enhanced. MIP images reviewed. IMPRESSION: CT HEAD: 1. No acute intracranial process ; patient's known acute small infarcts not apparent by CT. 2. Mild chronic small vessel ischemic disease. 3. Borderline parenchymal brain volume loss  for age. CTA NECK: 1. Mild atherosclerosis without hemodynamically significant stenosis or acute vascular process. CTA HEAD: 1. No emergent large vessel occlusion. 2. Severe stenosis RIGHT proximal P2 segment. Moderate stenosis RIGHT P1 segment. 3. Mild cerebral artery luminal irregularity compatible with atherosclerosis. Electronically Signed   By: Elon Alas M.D.   On: 05/03/2017 02:04   Mr Brain Wo Contrast  Result Date: 05/02/2017 MRI HEAD WITHOUT CONTRAST TECHNIQUE: Multiplanar, multiecho pulse sequences of the brain and surrounding structures were obtained without intravenous contrast. COMPARISON:  Head CT 3 days ago FINDINGS: Brain: 6 mm acute cortical infarct in the parasagittal left occipital cortex. Abnormal but less intense diffusion hyperintensity in the deep right cerebral white matter (posterior corona radiata), and cortex of the right occipital temporal lobe - these are in the right MCA/ posterior border zone distribution. There is patchy FLAIR hyperintensity in the cerebral white matter, mild but abnormal for age. Patient's history of diabetes and hypertension and this is likely premature chronic microvascular ischemia. No typical multiple sclerosis type pattern. No hemorrhage, hydrocephalus, or masslike findings. Vascular: Major flow voids are preserved Skull and upper cervical spine: Negative for marrow lesion Sinuses/Orbits: Negative IMPRESSION: 1. 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 2 even smaller and likely subacute infarcts in the posterior right cerebrum. 2. Mild but premature white matter signal abnormality, patient's medical history suggesting chronic small vessel ischemia. Electronically Signed   By: Monte Fantasia M.D.   On: 05/02/2017 14:01   Dg Chest 2 View 04/29/2017 . FINDINGS: The heart size and mediastinal contours are  within normal limits. Both lungs are clear. The visualized skeletal structures are unremarkable.  IMPRESSION: No active  cardiopulmonary disease.   Ct Head Wo Contrast 04/29/2017 FINDINGS: Brain: No evidence of acute infarction, hemorrhage, hydrocephalus, extra-axial collection or mass lesion/mass effect. Vascular: No hyperdense vessel or unexpected calcification. Skull: Normal. Negative for fracture or focal lesion. Sinuses/Orbits: No acute finding. Thomas: None.  IMPRESSION:  1. Negative for bleed or Thomas acute intracranial process.    Results/Tests Pending at Time of Discharge: none  Discharge Medications:  Allergies as of 05/04/2017   No Known Allergies     Medication List    TAKE these medications   aspirin 325 MG EC tablet Take 1 tablet (325 mg total) by mouth daily.   atorvastatin 80 MG tablet Commonly known as:  LIPITOR Take 1 tablet (80 mg total) by mouth daily at 6 PM.   blood glucose meter kit and supplies Dispense based on patient and insurance preference. Use up to four times daily as directed. (FOR ICD-9 250.00, 250.01).   cloNIDine 0.2 MG tablet Commonly known as:  CATAPRES Take 1 tablet (0.2 mg total) by mouth 2 (two) times daily. What changed:  medication strength  how much to take   clopidogrel 75 MG tablet Commonly known as:  PLAVIX Take 1 tablet (75 mg total) by mouth daily.   hydrochlorothiazide 25 MG tablet Commonly known as:  HYDRODIURIL Take 1 tablet (25 mg total) by mouth daily.   Insulin Glargine 100 UNIT/ML Solostar Pen Commonly known as:  LANTUS Inject 13 Units into the skin every morning.   lisinopril 40 MG tablet Commonly known as:  PRINIVIL,ZESTRIL Take 1 tablet (40 mg total) by mouth daily.     ASK your doctor about these medications   Insulin Pen Needle 31G X 5 MM Misc 1 application by Does not apply route once. Ask about: Should I take this medication?            Discharge Care Instructions        Start     Ordered   05/05/17 0000  lisinopril (PRINIVIL,ZESTRIL) 40 MG tablet  Daily     05/04/17 1613   05/05/17 0000  aspirin EC 325 MG  EC tablet  Daily     05/04/17 1613   05/05/17 0000  clopidogrel (PLAVIX) 75 MG tablet  Daily     05/04/17 1613   05/05/17 0000  hydrochlorothiazide (HYDRODIURIL) 25 MG tablet  Daily     05/04/17 1613   05/04/17 0000  atorvastatin (LIPITOR) 80 MG tablet  Daily-1800     05/04/17 1613   05/04/17 0000  cloNIDine (CATAPRES) 0.2 MG tablet  2 times daily     05/04/17 1613   05/04/17 0000  Insulin Glargine (LANTUS) 100 UNIT/ML Solostar Pen   Every morning - 10a     05/04/17 1613   05/04/17 0000  Insulin Pen Needle 31G X 5 MM MISC   Once     05/04/17 1613   05/04/17 0000  Ambulatory referral to Neurology    Comments:  Follow up with stroke clinic Cecille Rubin preferred, if not available, then consider Caesar Chestnut, Penumalli or Jaynee Eagles whoever is available) at [facility] in about 6-8 weeks. Thanks.   05/04/17 1643   05/04/17 0000  blood glucose meter kit and supplies    Question Answer Comment  Number of strips 100   Number of lancets 100      05/04/17 1726      Discharge Instructions: Please refer  to Patient Instructions section of EMR for full details.  Patient was counseled important signs and symptoms that should prompt return to medical care, changes in medications, dietary instructions, activity restrictions, and follow up appointments.   Follow-Up Appointments: Follow-up Information    Dickie La, MD. Go on 05/10/2017.   Specialties:  Family Medicine, Sports Medicine Why:  Please go to your hospital follow up appointment and arrive 15 minutes early. Your appointment is at 10:50 AM. Contact information: [address] [place] [address] [phone]        Dennie Bible, NP. Schedule an appointment as soon as possible for a visit in 6 week(s).   Specialty:  Family Medicine Contact information: [address] [place] [address] [phone]           Shya Kovatch, [place], [place] 05/05/2017, 9:31 AM PGY-1, [place]

## 2017-05-01 NOTE — Progress Notes (Signed)
Pt came down to MRI for brain scan.  Pt was put in scanner and could not tolerate due to claustrophobia and chest pain.  Pt was in our larger bore scanner.  Recommend sedation meds and pt will have to be worked back into the schedule.

## 2017-05-01 NOTE — Progress Notes (Signed)
Results for Thomas Burch, Thomas Burch (MRN [id number]) as of 05/01/2017 14:38  Ref. Range 04/30/2017 12:08 04/30/2017 17:31 04/30/2017 21:05 05/01/2017 07:48 05/01/2017 12:10  Glucose-Capillary Latest Ref Range: 65 - 99 mg/dL 321 (H) 224 (H) 825 (H) 300 (H) 361 (H)  Noted that patient's postprandial blood sugars have been greater than 180 mg/dl.  Recommend increasing Novolog correction scale to MODERATE TID & HS and add Novolog 4 units TID with meals if eats at least 50% of meals if blood sugars continue to be elevated. Will continue to monitor blood sugars while in the hospital.  Smith Mince RN BSN CDE Diabetes Coordinator Pager: [phone]  8am-5pm

## 2017-05-01 NOTE — Progress Notes (Signed)
Family medicine paged with pager number [phone] dt pt's inability to sleep and wanting to have something to help him to sleep. Pager was received and they called back to see what they can do. Awaiting for further new order possibly.

## 2017-05-01 NOTE — Progress Notes (Signed)
Report given to [facility] and bedside rounding done.

## 2017-05-01 NOTE — Progress Notes (Signed)
Results for ISHAWN, MULROY (MRN [id number]) as of 05/01/2017 08:29  Ref. Range 04/30/2017 08:19 04/30/2017 12:08 04/30/2017 17:31 04/30/2017 21:05 05/01/2017 07:48  Glucose-Capillary Latest Ref Range: 65 - 99 mg/dL 270 (H) 623 (H) 762 (H) 323 (H) 300 (H)  Noted that blood sugars continue to be greater than 180 mg/dl. Recommend increasing Lantus to 15-20 units daily if blood sugars continue to be elevated. Will see patient later today about his new onset of diabetes. Ordered Living Well with Diabetes booklet and dietician consult. Will continue to monitor blood sugars while in the hospital.  Smith Mince RN BSN CDE Diabetes Coordinator Pager: [phone]  8am-5pm

## 2017-05-02 ENCOUNTER — Inpatient Hospital Stay (HOSPITAL_COMMUNITY): Payer: Commercial Managed Care - PPO

## 2017-05-02 DIAGNOSIS — I639 Cerebral infarction, unspecified: Secondary | ICD-10-CM | POA: Diagnosis not present

## 2017-05-02 DIAGNOSIS — E118 Type 2 diabetes mellitus with unspecified complications: Secondary | ICD-10-CM

## 2017-05-02 DIAGNOSIS — R4182 Altered mental status, unspecified: Secondary | ICD-10-CM

## 2017-05-02 LAB — BASIC METABOLIC PANEL WITH GFR
Anion gap: 10 (ref 5–15)
BUN: 9 mg/dL (ref 6–20)
CO2: 28 mmol/L (ref 22–32)
Calcium: 8.9 mg/dL (ref 8.9–10.3)
Chloride: 98 mmol/L — ABNORMAL LOW (ref 101–111)
Creatinine, Ser: 0.87 mg/dL (ref 0.61–1.24)
GFR calc Af Amer: >60 mL/min
GFR calc non Af Amer: >60 mL/min
Glucose, Bld: 240 mg/dL — ABNORMAL HIGH (ref 65–99)
Potassium: 3.2 mmol/L — ABNORMAL LOW (ref 3.5–5.1)
Sodium: 136 mmol/L (ref 135–145)

## 2017-05-02 LAB — GLUCOSE, CAPILLARY
GLUCOSE-CAPILLARY: 266 mg/dL — AB (ref 65–99)
GLUCOSE-CAPILLARY: 270 mg/dL — AB (ref 65–99)
GLUCOSE-CAPILLARY: 161 mg/dL — AB (ref 65–99)
Glucose-Capillary: 265 mg/dL — ABNORMAL HIGH (ref 65–99)

## 2017-05-02 LAB — CBC
HCT: 48.1 % (ref 39.0–52.0)
Hemoglobin: 15.8 g/dL (ref 13.0–17.0)
MCH: 28.6 pg (ref 26.0–34.0)
MCHC: 32.8 g/dL (ref 30.0–36.0)
MCV: 87.1 fL (ref 78.0–100.0)
Platelets: 269 K/uL (ref 150–400)
RBC: 5.52 MIL/uL (ref 4.22–5.81)
RDW: 12.9 % (ref 11.5–15.5)
WBC: 13.3 K/uL — ABNORMAL HIGH (ref 4.0–10.5)

## 2017-05-02 LAB — C-PEPTIDE: C-Peptide: 3.4 ng/mL (ref 1.1–4.4)

## 2017-05-02 LAB — CSF CULTURE W GRAM STAIN: Special Requests: NORMAL

## 2017-05-02 LAB — CSF CULTURE: CULTURE: NO GROWTH

## 2017-05-02 MED ORDER — ASPIRIN 81 MG PO CHEW: 81.0000 mg | CHEWABLE_TABLET | Freq: Once | ORAL | Status: DC

## 2017-05-02 MED ORDER — ATORVASTATIN CALCIUM 40 MG PO TABS
40.0000 mg | ORAL_TABLET | Freq: Every day | ORAL | Status: DC
  Administered 2017-05-02: 40 mg via ORAL
  Filled 2017-05-02: qty 1

## 2017-05-02 MED ORDER — LORAZEPAM 2 MG/ML IJ SOLN
1.0000 mg | Freq: Once | INTRAMUSCULAR | Status: AC
  Administered 2017-05-02: 1 mg via INTRAVENOUS
  Filled 2017-05-02: qty 1

## 2017-05-02 MED ORDER — ENOXAPARIN SODIUM 60 MG/0.6ML ~~LOC~~ SOLN
55.0000 mg | SUBCUTANEOUS | Status: DC
  Administered 2017-05-02 – 2017-05-03 (×2): 55 mg via SUBCUTANEOUS
  Filled 2017-05-02 (×2): qty 0.6

## 2017-05-02 MED ORDER — ASPIRIN EC 325 MG PO TBEC
325.0000 mg | DELAYED_RELEASE_TABLET | Freq: Every day | ORAL | Status: DC
  Administered 2017-05-02 – 2017-05-04 (×3): 325 mg via ORAL
  Filled 2017-05-02 (×3): qty 1

## 2017-05-02 MED ORDER — POTASSIUM CHLORIDE CRYS ER 20 MEQ PO TBCR
40.0000 meq | EXTENDED_RELEASE_TABLET | Freq: Once | ORAL | Status: AC
  Administered 2017-05-02: 40 meq via ORAL
  Filled 2017-05-02: qty 2

## 2017-05-02 MED ORDER — STROKE: EARLY STAGES OF RECOVERY BOOK
Freq: Once | Status: DC
  Filled 2017-05-02: qty 1

## 2017-05-02 MED ORDER — LISINOPRIL 40 MG PO TABS
40.0000 mg | ORAL_TABLET | Freq: Every day | ORAL | Status: DC
  Administered 2017-05-02 – 2017-05-04 (×3): 40 mg via ORAL
  Filled 2017-05-02 (×3): qty 1

## 2017-05-02 MED ORDER — HYDROCHLOROTHIAZIDE 12.5 MG PO CAPS
12.5000 mg | ORAL_CAPSULE | Freq: Every day | ORAL | Status: DC
  Administered 2017-05-02: 12.5 mg via ORAL
  Filled 2017-05-02: qty 1

## 2017-05-02 NOTE — Plan of Care (Signed)
Problem: Food- and Nutrition-Related Knowledge Deficit (NB-1.1) Goal: Nutrition counseling A supportive process, characterized by a collaborative counselor-patient/client relationship, to set priorities, establish goals, and create individualized action plans that acknowledge and foster responsibility for self-care to treat an existing condition and promote health. Outcome: Completed/Met Date Met: 05/02/17  RD consulted for nutrition education regarding diabetes.   Lab Results  Component Value Date   HGBA1C 11.9 (H) 04/29/2017    RD provided "Carbohydrate Counting for People with Diabetes" handout from the Academy of Nutrition and Dietetics. Discussed different food groups and their effects on blood sugar, emphasizing carbohydrate-containing foods. Provided list of carbohydrates and recommended serving sizes of common foods.  Discussed importance of controlled and consistent carbohydrate intake throughout the day. Provided examples of ways to balance meals/snacks and encouraged intake of high-fiber, whole grain complex carbohydrates. Teach back method used.  Expect good compliance. Pt very receptive to education, appeared to have good understanding of diet post education. Pt would likely benefit from outpatient education as well due to new diagnosis.   Body mass index is 35.41 kg/m. Pt meets criteria for obesity unspecified based on current BMI.  Current diet order is Heart Healthy/Carb Modified, patient is consuming approximately 100% of meals at this time. Labs and medications reviewed. No further nutrition interventions warranted at this time. RD contact information provided. If additional nutrition issues arise, please re-consult RD.  Kerman Passey MS, [place], LDN [phone] Pager  [phone] Weekend/On-Call Pager'

## 2017-05-02 NOTE — Care Management Note (Signed)
Case Management Note  Patient Details  Name: Thomas Burch MRN: [id number] Date of Birth: 1986-03-05  Subjective/Objective:                 Patient admitted from home, Lives with Gearldine Shown, girlfriend and her child. Presenting with with AMS and low-grade fever.   Action/Plan:  CM will continue to follow Expected Discharge Date:  05/01/17               Expected Discharge Plan:  Home/Self Care  In-House Referral:     Discharge planning Services  CM Consult  Post Acute Care Choice:    Choice offered to:     DME Arranged:    DME Agency:     HH Arranged:    HH Agency:     Status of Service:  In process, will continue to follow  If discussed at Long Length of Stay Meetings, dates discussed:    Additional Comments:  Lawerance Sabal, RN 05/02/2017, 2:06 PM

## 2017-05-02 NOTE — Progress Notes (Signed)
Inpatient Diabetes Program Recommendations  AACE/ADA: New Consensus Statement on Inpatient Glycemic Control (2015)  Target Ranges:  Prepandial:   less than 140 mg/dL      Peak postprandial:   less than 180 mg/dL (1-2 hours)      Critically ill patients:  140 - 180 mg/dL   Spoke with patient very briefly about Diabetes Diagnosis. Patient has family Hx of this as well. Spoke with patient about A1c level (11.9%). Spoke to patient about the basic pathophysiology of Diabetes. Patient has started DM education. Patient has watched about 3 DM videos and has started reviewing the booklet. Patient asking about insulin pump. Discussed the need for follow up and possibly Endocrinology for more specialized care. Will see patient again tomorrow to try to cover more. Patient was taken to vascular lab within minutes of me coming in to see patient.  Thanks,  Christena Deem RN, MSN, [facility] Inpatient Diabetes Coordinator Team Pager [phone] (8a-5p)

## 2017-05-02 NOTE — Consult Note (Signed)
Requesting Physician: Dr. Lum Babe    Chief Complaint: Stroke  History obtained from:  Patient     HPI:                                                                                                                                         Thomas Burch is an 31 y.o. male who states on Sunday approximate about once time he was talking with his friends when he suddenly noted that he had the inability to express himself. In the ED patient was found to be altered however he did have a low-grade fever and elevated white blood cell count. Patient had LP which was insignificant at that time. He states he knew that he wanted to say however when he tried to get the words out that didn't come out. If he thought for second towards were able to come out. He has never had symptoms like this before. He does admit that he has not been compliant with his medications. He knows he has hypertension but has been taking the medication only when he wants to. He found out during this hospital visit that he is diabetic. Other than the speech issues he had no other findings such as decreased sensation, blurred vision, decreased vision, or weakness. He does not know of any family members who had any hypercoagulable states. He does not take aspirin on a daily basis. Patient states he does drink occasionally but he is not a heavy drinker. He does not take part in any illicit drugs such as cocaine or stimulants  Date last known well: Date: 04/30/2017 Time last known well: Time: 12:00 tPA Given: No: Out of window Modified Rankin: Rankin Score=0  NIH stroke score of 0    Past Medical History:  Diagnosis Date  . Headache   . Hypertension     History reviewed. No pertinent surgical history.  Family History  Problem Relation Age of Onset  . Hypertension Father   . Stroke Father   . Hyperlipidemia Father   . Diabetes Maternal Grandmother    Social History:  reports that he has never smoked. He has never used smokeless  tobacco. He reports that he does not drink alcohol or use drugs.  Allergies: No Known Allergies  Medications:  Scheduled: .  stroke: mapping our early stages of recovery book   Does not apply Once  . aspirin  81 mg Oral Once  . atorvastatin  40 mg Oral q1800  . cloNIDine  0.1 mg Oral BID  . enoxaparin (LOVENOX) injection  55 mg Subcutaneous Q24H  . hydrochlorothiazide  12.5 mg Oral Daily  . insulin aspart  0-9 Units Subcutaneous TID WC  . insulin glargine  10 Units Subcutaneous Daily  . lisinopril  40 mg Oral Daily  . sodium chloride flush  3 mL Intravenous Q12H    ROS:                                                                                                                                       History obtained from the patient  Thomas ROS: negative for - chills, fatigue, fever, night sweats, weight gain or weight loss Psychological ROS: negative for - behavioral disorder, hallucinations, memory difficulties, mood swings or suicidal ideation Ophthalmic ROS: negative for - blurry vision, double vision, eye pain or loss of vision ENT ROS: negative for - epistaxis, nasal discharge, oral lesions, sore throat, tinnitus or vertigo Allergy and Immunology ROS: negative for - hives or itchy/watery eyes Hematological and Lymphatic ROS: negative for - bleeding problems, bruising or swollen lymph nodes Endocrine ROS: negative for - galactorrhea, hair pattern changes, polydipsia/polyuria or temperature intolerance Respiratory ROS: negative for - cough, hemoptysis, shortness of breath or wheezing Cardiovascular ROS: negative for - chest pain, dyspnea on exertion, edema or irregular heartbeat Gastrointestinal ROS: negative for - abdominal pain, diarrhea, hematemesis, nausea/vomiting or stool incontinence Genito-Urinary ROS: negative for - dysuria, hematuria, incontinence  or urinary frequency/urgency Musculoskeletal ROS: negative for - joint swelling or muscular weakness Neurological ROS: as noted in HPI Dermatological ROS: negative for rash and skin lesion changes  Neurologic Examination:                                                                                                      Blood pressure (!) 157/101, pulse (!) 106, temperature 98.2 F (36.8 C), temperature source Oral, resp. rate 18, height 5\' 10"  (1.778 m), weight 111.9 kg (246 lb 12.8 oz), SpO2 99 %.  HEENT-  Normocephalic, no lesions, without obvious abnormality.  Normal external eye and conjunctiva.  Normal TM's bilaterally.  Normal auditory canals and external ears. Normal external nose, mucus membranes and septum.  Normal pharynx. Cardiovascular- S1, S2 normal, pulses palpable throughout   Lungs- chest clear, no  wheezing, rales, normal symmetric air entry Abdomen- normal findings: bowel sounds normal Extremities- no edema Lymph-no adenopathy palpable Musculoskeletal-no joint tenderness, deformity or swelling Skin-warm and dry, no hyperpigmentation, vitiligo, or suspicious lesions  Neurological Examination Mental Status: Alert, oriented, thought content appropriate.  Speech fluent without evidence of aphasia.  Able to follow 3 step commands without difficulty. Cranial Nerves: II: Discs flat bilaterally; Visual fields grossly normal,  III,IV, VI: ptosis not present, extra-ocular motions intact bilaterally, pupils equal, round, reactive to light and accommodation V,VII: smile symmetric, facial light touch sensation normal bilaterally VIII: hearing normal bilaterally IX,X: uvula rises symmetrically XI: bilateral shoulder shrug XII: midline tongue extension Motor: Right : Upper extremity   5/5    Left:     Upper extremity   5/5  Lower extremity   5/5     Lower extremity   5/5 Tone and bulk:normal tone throughout; no atrophy noted Sensory: Pinprick and light touch intact throughout,  bilaterally Deep Tendon Reflexes: 2+ and symmetric throughout Plantars: Right: downgoing   Left: downgoing Cerebellar: normal finger-to-nose, normal rapid alternating movements and normal heel-to-shin test Gait: normal gait and station       Lab Results: Basic Metabolic Panel:  Recent Labs Lab 04/29/17 1005 04/29/17 2139 04/30/17 0157 05/01/17 0546 05/02/17 0432  NA 133*  --  136 137 136  K 3.8  --  3.4* 4.2 3.2*  CL 95*  --  102 100* 98*  CO2 24  --  25 28 28   GLUCOSE 374*  --  421* 287* 240*  BUN 8  --  14 11 9   CREATININE 1.03 1.09 0.96 0.98 0.87  CALCIUM 9.1  --  8.6* 8.7* 8.9    Liver Function Tests:  Recent Labs Lab 04/29/17 1005  AST 39  ALT 65*  ALKPHOS 98  BILITOT 1.3*  PROT 7.2  ALBUMIN 4.1   No results for input(s): LIPASE, AMYLASE in the last 168 hours. No results for input(s): AMMONIA in the last 168 hours.  CBC:  Recent Labs Lab 04/29/17 1005 04/30/17 0157 05/01/17 0546 05/02/17 0443  WBC 18.4* 15.5* 15.9* 13.3*  NEUTROABS 15.7*  --   --   --   HGB 17.6* 14.0 15.9 15.8  HCT 49.7 42.8 48.9 48.1  MCV 84.2 88.2 88.9 87.1  PLT 280 265 245 269    Cardiac Enzymes: No results for input(s): CKTOTAL, CKMB, CKMBINDEX, TROPONINI in the last 168 hours.  Lipid Panel: No results for input(s): CHOL, TRIG, HDL, CHOLHDL, VLDL, LDLCALC in the last 168 hours.  CBG:  Recent Labs Lab 05/01/17 1210 05/01/17 1648 05/01/17 2151 05/02/17 0746 05/02/17 1338  GLUCAP 361* 263* 195* 266* 270*    Lumbar puncture:  Results for Thomas, Burch (MRN [id number]) as of 05/02/2017 14:57  Ref. Range 04/29/2017 14:13 04/29/2017 14:14  Appearance, CSF Latest Ref Range: CLEAR  CLEAR CLEAR  RBC Count, CSF Latest Ref Range: 0 /cu mm 0 133 (H)  Lymphs, CSF Latest Ref Range: 40 - 80 % RARE RARE  Monocyte-Macrophage-Spinal Fluid Latest Ref Range: 15 - 45 % RARE RARE  Other Cells, CSF Unknown TOO FEW TO COUNT,... TOO FEW TO COUNT,...  Color, CSF Latest Ref  Range: COLORLESS  COLORLESS COLORLESS  Supernatant Unknown NOT INDICATED NOT INDICATED  Tube # Unknown 1 4  WBC, CSF Latest Ref Range: 0 - 5 /cu mm 0 0      Microbiology: Results for orders placed or performed during the hospital encounter of 04/29/17  Blood Culture (  routine x 2)     Status: None (Preliminary result)   Collection Time: 04/29/17  9:54 AM  Result Value Ref Range Status   Specimen Description BLOOD LEFT ANTECUBITAL  Final   Special Requests IN PEDIATRIC BOTTLE Blood Culture adequate volume  Final   Culture NO GROWTH 3 DAYS  Final   Report Status PENDING  Incomplete  Blood Culture (routine x 2)     Status: Abnormal   Collection Time: 04/29/17 10:04 AM  Result Value Ref Range Status   Specimen Description BLOOD RIGHT FOREARM  Final   Special Requests   Final    BOTTLES DRAWN AEROBIC AND ANAEROBIC Blood Culture adequate volume   Culture  Setup Time   Final    GRAM POSITIVE COCCI IN CLUSTERS AEROBIC BOTTLE ONLY CRITICAL RESULT CALLED TO, READ BACK BY AND VERIFIED WITH: M SHUDA,PHARMD AT 0901 04/30/17 BY L BENFIELD    Culture (A)  Final    STAPHYLOCOCCUS SPECIES (COAGULASE NEGATIVE) THE SIGNIFICANCE OF ISOLATING THIS ORGANISM FROM A SINGLE SET OF BLOOD CULTURES WHEN MULTIPLE SETS ARE DRAWN IS UNCERTAIN. PLEASE NOTIFY THE MICROBIOLOGY DEPARTMENT WITHIN ONE WEEK IF SPECIATION AND SENSITIVITIES ARE REQUIRED.    Report Status 05/01/2017 FINAL  Final  Blood Culture ID Panel (Reflexed)     Status: Abnormal   Collection Time: 04/29/17 10:04 AM  Result Value Ref Range Status   Enterococcus species NOT DETECTED NOT DETECTED Final   Listeria monocytogenes NOT DETECTED NOT DETECTED Final   Staphylococcus species DETECTED (A) NOT DETECTED Final    Comment: Methicillin (oxacillin) susceptible coagulase negative staphylococcus. Possible blood culture contaminant (unless isolated from more than one blood culture draw or clinical case suggests pathogenicity). No antibiotic treatment is  indicated for blood  culture contaminants. CRITICAL RESULT CALLED TO, READ BACK BY AND VERIFIED WITH: M SHUDA,PHARMD AT 0901 04/30/17 BY L BENFIELD    Staphylococcus aureus NOT DETECTED NOT DETECTED Final   Methicillin resistance NOT DETECTED NOT DETECTED Final   Streptococcus species NOT DETECTED NOT DETECTED Final   Streptococcus agalactiae NOT DETECTED NOT DETECTED Final   Streptococcus pneumoniae NOT DETECTED NOT DETECTED Final   Streptococcus pyogenes NOT DETECTED NOT DETECTED Final   Acinetobacter baumannii NOT DETECTED NOT DETECTED Final   Enterobacteriaceae species NOT DETECTED NOT DETECTED Final   Enterobacter cloacae complex NOT DETECTED NOT DETECTED Final   Escherichia coli NOT DETECTED NOT DETECTED Final   Klebsiella oxytoca NOT DETECTED NOT DETECTED Final   Klebsiella pneumoniae NOT DETECTED NOT DETECTED Final   Proteus species NOT DETECTED NOT DETECTED Final   Serratia marcescens NOT DETECTED NOT DETECTED Final   Haemophilus influenzae NOT DETECTED NOT DETECTED Final   Neisseria meningitidis NOT DETECTED NOT DETECTED Final   Pseudomonas aeruginosa NOT DETECTED NOT DETECTED Final   Candida albicans NOT DETECTED NOT DETECTED Final   Candida glabrata NOT DETECTED NOT DETECTED Final   Candida krusei NOT DETECTED NOT DETECTED Final   Candida parapsilosis NOT DETECTED NOT DETECTED Final   Candida tropicalis NOT DETECTED NOT DETECTED Final  CSF culture     Status: None   Collection Time: 04/29/17  1:52 PM  Result Value Ref Range Status   Specimen Description CSF  Final   Special Requests Normal  Final   Gram Stain   Final    WBC PRESENT, PREDOMINANTLY MONONUCLEAR NO ORGANISMS SEEN CYTOSPIN SMEAR    Culture NO GROWTH 3 DAYS  Final   Report Status 05/02/2017 FINAL  Final  Hsv Culture And Typing  Status: None   Collection Time: 04/29/17  1:52 PM  Result Value Ref Range Status   HSV Culture/Type Comment  Final    Comment: (NOTE) Negative No Herpes simplex virus  isolated. Performed At: [facility] [address] [place], [place] [id number] Mila Homer MD WU:[id number]    Source of Sample CSF  Final    Coagulation Studies: No results for input(s): LABPROT, INR in the last 72 hours.  Imaging: Mr Brain Wo Contrast  Result Date: 05/02/2017 CLINICAL DATA:  Altered mental status. EXAM: MRI HEAD WITHOUT CONTRAST TECHNIQUE: Multiplanar, multiecho pulse sequences of the brain and surrounding structures were obtained without intravenous contrast. COMPARISON:  Head CT 3 days ago FINDINGS: Brain: 6 mm acute cortical infarct in the parasagittal left occipital cortex. Abnormal but less intense diffusion hyperintensity in the deep right cerebral white matter (posterior corona radiata), and cortex of the right occipital temporal lobe - these are in the right MCA/ posterior border zone distribution. There is patchy FLAIR hyperintensity in the cerebral white matter, mild but abnormal for age. Patient's history of diabetes and hypertension and this is likely premature chronic microvascular ischemia. No typical multiple sclerosis type pattern. No hemorrhage, hydrocephalus, or masslike findings. Vascular: Major flow voids are preserved Skull and upper cervical spine: Negative for marrow lesion Sinuses/Orbits: Negative IMPRESSION: 1. 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 2 even smaller and likely subacute infarcts in the posterior right cerebrum. 2. Mild but premature white matter signal abnormality, patient's medical history suggesting chronic small vessel ischemia. Electronically Signed   By: Marnee Spring M.D.   On: 05/02/2017 14:01    A1c was 11.9, TSH 0.493, CSF protein 24, CSF glucose 166, CSF was colorless, clear, red blood cells 133 with no white blood cells and rare lymph and monocytes., CSF HSV was negative, rapid urine drug screen was negative   Assessment and plan discussed with with attending physician and they are in  agreement.    Felicie Morn PA-C [facility] [phone]  05/02/2017, 2:52 PM   Assessment: 31 y.o. male presenting with what in retrospect sounds like possibly a mild aphasia and was found to have subcentimeter infarcts in the left occipital cortex along with 2 even smaller and likely subacute infarcts in the posterior right cerebrum. Exam shows no focal abnormalities. Given patient's age, risk factors he will need a stroke workup. If no obvious sources are found during the routine stroke workup he will need to further be evaluated with hypercoagulable panel.  Stroke Risk Factors - carotid stenosis and hypertension  Recommend 1. HgbA1c, fasting lipid panel 2. CTA head and neck 3. PT consult, OT consult, Speech consult 4. Echocardiogram 5. 80 mg of Atorvistatin 6. Prophylactic therapy-Antiplatelet med: Aspirin 325mg  daily 7. Risk factor modification 8. Telemetry monitoring 9. Frequent neuro checks 10 NPO until passes stroke swallow screen 11 please page stroke NP  Or  PA  Or MD from 8am -4 pm  as this patient from this time will be  followed by the stroke.   You can look them up on [email or web address]  Password [email or web address]   Ritta Slot, MD [facility] [phone]  If 7pm- 7am, please page neurology on call as listed in AMION.

## 2017-05-02 NOTE — Progress Notes (Signed)
FPTS Interim Progress Note  S: Curb-sided neurology, they plan to see him inpatient after MRI results.   O: BP (!) 157/101   Pulse (!) 106   Temp 98.2 F (36.8 C) (Oral)   Resp 18   Ht 5\' 10"  (1.778 m)   Wt 246 lb 12.8 oz (111.9 kg)   SpO2 99%   BMI 35.41 kg/m     A/P: Rehab and therapy not recommended due to no residual deficits.  Will continue with stroke risk stratification protocol.   We appreciate neurology consult.   Gaylen Venning, [place], DO 05/02/2017, 2:29 PM PGY-1, [facility] Family Medicine Service pager [phone]

## 2017-05-02 NOTE — Progress Notes (Signed)
Family Medicine Teaching Service Daily Progress Note Intern Pager: [phone]  Patient name: Thomas Burch Medical record number: [id number] Date of birth: 17-Sep-1985 Age: 31 y.o. Gender: male  Primary Care Provider: Patient, No Pcp Per Consultants: None Code Status: Full  Pt Overview and Major Events to Date:  8/25 admit for AMS, initiate broad spectrum abx coverage for ?meningitis. CT head negative  Assessment and Plan: Thomas Burch is a 31 y.o. male presenting with with AMS and low-grade fever. PMH is significant for poorly controlled hypertension obesity  Altered mental status: Most likely hypertensive encephalopathy given initial pressures 223/127 on admit. LP to r/o meningitis negative. Prelim CSF with protein 24 (WNL), glucose elevated at 166, and negative cultures, negative PCR. CT head negative for acute bleed. - Negative CSF cultures- stopped ceftriaxone, vanc,(8/25-27) and acyclovir - BCID with 1/2 vials positive for staph species, likely contaminant- if patient become febrile, repeat cultures - follow up MRI brain for 8/28- patient attempted, but unable to complete- added xanax for while he is getting MRI - trend lactate until WNL , 2.97>1.2 8/27 s/p fluids  - Tylenol given prn for headache  Diabetes, diagnosed this admission.  A1c 11.9, CBGs 240, 266 overnight. Required 17 units sliding scale, 10 U lantus.  - serial cbg checks - SSI - C-peptide pending - HH/Carb modified diet - will require diabetes teaching- plan to send home with oral medications and close follow up depending on C-peptide results  HTN: Uncontrolled. Hypertensive emergency on admit, 181/133 this AM. Med rec showing clonidine 0.1mg  BID, lopressor 25mg  BID and linsinopril-HCTZ combination. Patient reports only taking clonidine daily.  - continue home clonidine 0.1 mg BID - increase lisinopril to 40mg  daily. Add HCTZ 12.5 - hydralazine prn, 10 mg q4h, SBP >160/100  Cough, resolved - CXR showing no  active pulmonary disease.  - duonebs q4hrs PRN - tessalon perles   Hypokalemia- 3.2 8/28 will give 40 Kdur  ?hx autism - per report from patient's mother on admission. Reportedly patient is unaware of this diagnosis.  FEN/GI: HH/Carb modified diet Prophylaxis: lovenox  Disposition: home when medically stable  Subjective:  Patient ready to go home pending MRI. Says he feels better and his headache is better.  Objective: Temp:  [98.2 F (36.8 C)-98.6 F (37 C)] 98.2 F (36.8 C) (08/28 0441) Pulse Rate:  [97-109] 97 (08/28 0441) Resp:  [17-18] 17 (08/28 0441) BP: (155-188)/(109-133) 188/121 (08/28 0810) SpO2:  [97 %-99 %] 99 % (08/28 0441) Physical Exam: General: NAD, well-appearing Cardiovascular: RRR, no m/r/g Respiratory: CTA bil Abdomen: soft and nontender Extremities: non edematous Neuro: CN II-XII intact, AAOx3  Laboratory:  Recent Labs Lab 04/30/17 0157 05/01/17 0546 05/02/17 0443  WBC 15.5* 15.9* 13.3*  HGB 14.0 15.9 15.8  HCT 42.8 48.9 48.1  PLT 265 245 269    Recent Labs Lab 04/29/17 1005  04/30/17 0157 05/01/17 0546 05/02/17 0432  NA 133*  --  136 137 136  K 3.8  --  3.4* 4.2 3.2*  CL 95*  --  102 100* 98*  CO2 24  --  25 28 28   BUN 8  --  14 11 9   CREATININE 1.03  < > 0.96 0.98 0.87  CALCIUM 9.1  --  8.6* 8.7* 8.9  PROT 7.2  --   --   --   --   BILITOT 1.3*  --   --   --   --   ALKPHOS 98  --   --   --   --  ALT 65*  --   --   --   --   AST 39  --   --   --   --   GLUCOSE 374*  --  421* 287* 240*  < > = values in this interval not displayed. TSH 0.493 A1c 11.9  Imaging/Diagnostic Tests: Dg Chest 2 View 04/29/2017 . FINDINGS: The heart size and mediastinal contours are within normal limits. Both lungs are clear. The visualized skeletal structures are unremarkable.  IMPRESSION: No active cardiopulmonary disease.   Ct Head Wo Contrast 04/29/2017 FINDINGS: Brain: No evidence of acute infarction, hemorrhage, hydrocephalus, extra-axial  collection or mass lesion/mass effect. Vascular: No hyperdense vessel or unexpected calcification. Skull: Normal. Negative for fracture or focal lesion. Sinuses/Orbits: No acute finding. Other: None.  IMPRESSION:  1. Negative for bleed or other acute intracranial process.    Shanell Aden, [place], DO 05/02/2017, 8:55 AM PGY-1, [place] [facility] FPTS Intern pager: [phone], text pages welcome

## 2017-05-02 NOTE — Progress Notes (Signed)
Pharmacy: Lovenox Dose Adjustment for VTE prophylaxis  OBJECTIVE:  Wt: 112 kg   Ht: 5'10"  BMI~35 SCr 0.87  CrCl~100 ml/min  ASSESSMENT:  31 YOM on lovenox for VTE prophylaxis requiring a dose adjustment for BMI>30 and CrCl>30 ml/min  PLAN:  1. Adjust Lovenox to 55 mg (~0.5 mg/kg) SQ every 24 hours 2. Pharmacy will monitor peripherally for s/sx of bleeding and any necessary dose adjustments   Georgina Pillion, PharmD, BCPS Pager: [phone] 10:47 AM

## 2017-05-02 NOTE — Progress Notes (Signed)
Inpatient Diabetes Program Recommendations  AACE/ADA: New Consensus Statement on Inpatient Glycemic Control (2015)  Target Ranges:  Prepandial:   less than 140 mg/dL      Peak postprandial:   less than 180 mg/dL (1-2 hours)      Critically ill patients:  140 - 180 mg/dL   Lab Results  Component Value Date   GLUCAP 266 (H) 05/02/2017   HGBA1C 11.9 (H) 04/29/2017    Tried to speak with patient regarding new DM diagnosis. Patient out of room currently in test. Patient also premedicated for test. Will try to follow up later when appropriate for education.  Thanks,  Christena Deem RN, MSN, [facility] Inpatient Diabetes Coordinator Team Pager [phone] (8a-5p)

## 2017-05-02 NOTE — Progress Notes (Signed)
*  PRELIMINARY RESULTS* Vascular Ultrasound Carotid Duplex (Doppler) has been completed.  Findings suggest 1-39% internal carotid artery stenosis bilaterally. Vertebral arteries are patent with antegrade flow.  05/02/2017 3:23 PM Gertie Fey, BS, RVT, RDCS, RDMS

## 2017-05-03 ENCOUNTER — Inpatient Hospital Stay (HOSPITAL_COMMUNITY): Payer: Commercial Managed Care - PPO

## 2017-05-03 DIAGNOSIS — IMO0002 Reserved for concepts with insufficient information to code with codable children: Secondary | ICD-10-CM

## 2017-05-03 DIAGNOSIS — E1165 Type 2 diabetes mellitus with hyperglycemia: Secondary | ICD-10-CM

## 2017-05-03 DIAGNOSIS — E785 Hyperlipidemia, unspecified: Secondary | ICD-10-CM

## 2017-05-03 DIAGNOSIS — I639 Cerebral infarction, unspecified: Secondary | ICD-10-CM

## 2017-05-03 DIAGNOSIS — E1059 Type 1 diabetes mellitus with other circulatory complications: Secondary | ICD-10-CM

## 2017-05-03 DIAGNOSIS — R4182 Altered mental status, unspecified: Secondary | ICD-10-CM

## 2017-05-03 DIAGNOSIS — E119 Type 2 diabetes mellitus without complications: Secondary | ICD-10-CM

## 2017-05-03 DIAGNOSIS — I160 Hypertensive urgency: Secondary | ICD-10-CM

## 2017-05-03 DIAGNOSIS — I6340 Cerebral infarction due to embolism of unspecified cerebral artery: Secondary | ICD-10-CM

## 2017-05-03 LAB — GLUCOSE, CAPILLARY
GLUCOSE-CAPILLARY: 198 mg/dL — AB (ref 65–99)
GLUCOSE-CAPILLARY: 198 mg/dL — AB (ref 65–99)
Glucose-Capillary: 235 mg/dL — ABNORMAL HIGH (ref 65–99)
Glucose-Capillary: 261 mg/dL — ABNORMAL HIGH (ref 65–99)

## 2017-05-03 LAB — ECHOCARDIOGRAM COMPLETE
AV Area VTI: 3.01 cm2
AVLVOTPG: 4 mmHg
AVPG: 5 mmHg
AVPKVEL: 112 cm/s
Ao pk vel: 0.87 m/s
CHL CUP AV PEAK INDEX: 1.26
CHL CUP DOP CALC LVOT VTI: 14.6 cm
CHL CUP MV DEC (S): 127
E decel time: 127 msec
FS: 27 % — AB (ref 28–44)
Height: 70 in
IVS/LV PW RATIO, ED: 1.07
LA ID, A-P, ES: 40 mm
LA diam end sys: 40 mm
LA vol index: 21.1 mL/m2
LADIAMINDEX: 1.68 cm/m2
LAVOL: 50.2 mL
LAVOLA4C: 37.7 mL
LDCA: 3.46 cm2
LVOT diameter: 21 mm
LVOTPV: 97.5 cm/s
LVOTSV: 51 mL
MV Peak grad: 5 mmHg
MV pk A vel: 62.9 m/s
MVAP: 5.95 cm2
MVPKEVEL: 115 m/s
MVSPHT: 37 ms
PW: 14 mm — AB (ref 0.6–1.1)
RV LATERAL S' VELOCITY: 9.88 cm/s
TAPSE: 16.1 mm
Weight: 3920 oz

## 2017-05-03 LAB — CBC
HCT: 49.1 % (ref 39.0–52.0)
Hemoglobin: 16.6 g/dL (ref 13.0–17.0)
MCH: 29.2 pg (ref 26.0–34.0)
MCHC: 33.8 g/dL (ref 30.0–36.0)
MCV: 86.4 fL (ref 78.0–100.0)
PLATELETS: 263 10*3/uL (ref 150–400)
RBC: 5.68 MIL/uL (ref 4.22–5.81)
RDW: 13.1 % (ref 11.5–15.5)
WBC: 14.1 10*3/uL — AB (ref 4.0–10.5)

## 2017-05-03 LAB — BASIC METABOLIC PANEL
ANION GAP: 12 (ref 5–15)
BUN: 11 mg/dL (ref 6–20)
CALCIUM: 8.9 mg/dL (ref 8.9–10.3)
CO2: 24 mmol/L (ref 22–32)
Chloride: 98 mmol/L — ABNORMAL LOW (ref 101–111)
Creatinine, Ser: 0.9 mg/dL (ref 0.61–1.24)
Glucose, Bld: 239 mg/dL — ABNORMAL HIGH (ref 65–99)
POTASSIUM: 3.3 mmol/L — AB (ref 3.5–5.1)
SODIUM: 134 mmol/L — AB (ref 135–145)

## 2017-05-03 LAB — LIPID PANEL
CHOL/HDL RATIO: 5.4 ratio
CHOLESTEROL: 185 mg/dL (ref 0–200)
HDL: 34 mg/dL — ABNORMAL LOW (ref >40)
LDL Cholesterol: 115 mg/dL — ABNORMAL HIGH (ref 0–99)
TRIGLYCERIDES: 178 mg/dL — AB (ref <150)
VLDL: 36 mg/dL (ref 0–40)

## 2017-05-03 MED ORDER — HYDROCHLOROTHIAZIDE 25 MG PO TABS
25.0000 mg | ORAL_TABLET | Freq: Every day | ORAL | Status: DC
  Administered 2017-05-03 – 2017-05-04 (×2): 25 mg via ORAL
  Filled 2017-05-03 (×2): qty 1

## 2017-05-03 MED ORDER — POTASSIUM CHLORIDE CRYS ER 20 MEQ PO TBCR: 40.0000 meq | EXTENDED_RELEASE_TABLET | Freq: Two times a day (BID) | ORAL | Status: DC

## 2017-05-03 MED ORDER — CLOPIDOGREL BISULFATE 75 MG PO TABS
75.0000 mg | ORAL_TABLET | Freq: Every day | ORAL | Status: DC
  Administered 2017-05-03 – 2017-05-04 (×2): 75 mg via ORAL
  Filled 2017-05-03 (×2): qty 1

## 2017-05-03 MED ORDER — POTASSIUM CHLORIDE CRYS ER 20 MEQ PO TBCR: 40.0000 meq | EXTENDED_RELEASE_TABLET | Freq: Once | ORAL | Status: DC

## 2017-05-03 MED ORDER — CLONIDINE HCL 0.2 MG PO TABS
0.2000 mg | ORAL_TABLET | Freq: Two times a day (BID) | ORAL | Status: DC
  Administered 2017-05-03 – 2017-05-04 (×3): 0.2 mg via ORAL
  Filled 2017-05-03 (×3): qty 1

## 2017-05-03 MED ORDER — POTASSIUM CHLORIDE CRYS ER 20 MEQ PO TBCR
40.0000 meq | EXTENDED_RELEASE_TABLET | Freq: Once | ORAL | Status: AC
  Administered 2017-05-03: 40 meq via ORAL
  Filled 2017-05-03: qty 2

## 2017-05-03 MED ORDER — INSULIN GLARGINE 100 UNIT/ML ~~LOC~~ SOLN
13.0000 [IU] | Freq: Every day | SUBCUTANEOUS | Status: DC
  Administered 2017-05-03 – 2017-05-04 (×2): 13 [IU] via SUBCUTANEOUS
  Filled 2017-05-03 (×2): qty 0.13

## 2017-05-03 MED ORDER — METOPROLOL TARTRATE 5 MG/5ML IV SOLN
2.5000 mg | Freq: Four times a day (QID) | INTRAVENOUS | Status: DC | PRN
  Administered 2017-05-03 – 2017-05-04 (×3): 2.5 mg via INTRAVENOUS
  Filled 2017-05-03: qty 5

## 2017-05-03 MED ORDER — ATORVASTATIN CALCIUM 80 MG PO TABS
80.0000 mg | ORAL_TABLET | Freq: Every day | ORAL | Status: DC
  Administered 2017-05-03: 80 mg via ORAL
  Filled 2017-05-03: qty 1

## 2017-05-03 MED ORDER — IOPAMIDOL (ISOVUE-370) INJECTION 76%
INTRAVENOUS | Status: AC
  Administered 2017-05-03: 50 mL
  Filled 2017-05-03: qty 50

## 2017-05-03 NOTE — Progress Notes (Signed)
Family Medicine Teaching Service Daily Progress Note Intern Pager: [phone]  Patient name: Thomas Burch Medical record number: [id number] Date of birth: Apr 26, 1986 Age: 31 y.o. Gender: male  Primary Care Provider: Patient, No Pcp Per Consultants: None Code Status: Full  Pt Overview and Major Events to Date:  8/25 admit for AMS, initiate broad spectrum abx coverage for ?meningitis. CT head negative  Assessment and Plan: Thomas Burch is a 31 y.o. male presenting with with AMS and low-grade fever. PMH is significant for poorly controlled hypertension obesity  Stroke: Stable. Likely due to hypertension and new onset diabetes. Neurology to follow - MRI brain: Subcentimeter acute infarct in L occipital cortex, small subacute infarcts post R cerebrum, mild small vessel ischemia - CTA NECK: Mild atherosclerosis - CTA HEAD: No large vessel occlusion. Severe stenosis R proximal P2 segment. Moderate stenosis R P1 segment. Mild cerebral artery luminal irregularity compatible with Atherosclerosis. - Vascular consulted for medical vs surgical treatment of severe stenosis found on CTA head - carotid doppler normal - Lipid panel showing LDL 115, TG 178, HDL 34, Tot Chol 185. - To get BLLE doppler US today - ECHO completed, results pending - Patient started on ASA, Lipitor 80mg - recommended by Neuro. Pt has moderate risk according to CVD risk calculator, but will follow neuro recs due to pt age  Altered mental status: Resolved. Most likely due to small stroke and hypertensive encephalopathy. LP to r/o meningitis negative. CT head negative for acute bleed. - Negative CSF cultures- stopped ceftriaxone, vanc,(8/25-27) and acyclovir - If patient become febrile, repeat cultures - Tylenol given prn for headache  Diabetes, diagnosed this admission.  A1c 11.9, CBGs 161, 239, 235 overnight. Required 15 units sliding scale, 10 U lantus.  - serial cbg checks - SSI - C-peptide normal - HH/Carb modified  diet - will require diabetes teaching- tried to be seen by diabetes coordinator, but he was  In testing - Increasing lantus to 13 U  HTN: Uncontrolled. Hypertensive emergency on admit, 153/101 this AM. Med rec showing clonidine 0.1mg  BID, lopressor 25mg  BID and linsinopril-HCTZ combination. Patient reports only taking clonidine daily.  - Increase home clonidine 0.1 mg BID- 0.2 mg BID - Lisinopril 40mg  daily (8/28). Increase HCTZ  25 mg - Consider adding beta-blocker for tachycardia as well as BP control - hydralazine prn, 10 mg q4h, SBP >160/100  Cough, resolved - CXR showing no active pulmonary disease.  - duonebs q4hrs PRN - tessalon perles   Hypokalemia- 3.3 8/29 will give 40 Kdur, s/p 40 mEq Kdur 8/28 with K at 3.2  ?hx autism - Doubt. per report from patient's mother on admission. Reportedly patient is unaware of this diagnosis.  FEN/GI: HH/Carb modified diet Prophylaxis: lovenox  Disposition: home when medically stable  Subjective:  Patient ready to go home but feels a little overwhelmed from everything being thrown at him. He understands he needs to stay until it safe for him to leave. He feels better overall today, but says he is stressed easily. He thinks that being in the hospital could be adding to his high bp and increased heart rate. I told him he can walk outside if that will help him and he got excited and said it would help him immensely just to see some sun.   Objective: Temp:  [98 F (36.7 C)-98.8 F (37.1 C)] 98.6 F (37 C) (08/29 0514) Pulse Rate:  [78-112] 112 (08/29 0514) Resp:  [18] 18 (08/29 0514) BP: (153-188)/(101-129) 153/101 (08/29 0514) SpO2:  [95 %-  99 %] 98 % (08/29 0514) Weight:  [245 lb (111.1 kg)] 245 lb (111.1 kg) (08/28 2105) Physical Exam: General: NAD, well-appearing Cardiovascular: RRR, no m/r/g Respiratory: CTA bil Abdomen: soft and nontender Extremities: non edematous Neuro: CN II-XII intact, AAOx3  Laboratory:  Recent Labs Lab  05/01/17 0546 05/02/17 0443 05/03/17 0352  WBC 15.9* 13.3* 14.1*  HGB 15.9 15.8 16.6  HCT 48.9 48.1 49.1  PLT 245 269 263    Recent Labs Lab 04/29/17 1005  05/01/17 0546 05/02/17 0432 05/03/17 0352  NA 133*  < > 137 136 134*  K 3.8  < > 4.2 3.2* 3.3*  CL 95*  < > 100* 98* 98*  CO2 24  < > 28 28 24   BUN 8  < > 11 9 11   CREATININE 1.03  < > 0.98 0.87 0.90  CALCIUM 9.1  < > 8.7* 8.9 8.9  PROT 7.2  --   --   --   --   BILITOT 1.3*  --   --   --   --   ALKPHOS 98  --   --   --   --   ALT 65*  --   --   --   --   AST 39  --   --   --   --   GLUCOSE 374*  < > 287* 240* 239*  < > = values in this interval not displayed. TSH 0.493 A1c 11.9 C-peptide 3.4  Ct Angio Head W Or Wo Contrast  Result Date: 05/03/2017 CLINICAL DATA:  Follow-up stroke.  History of hypertension. EXAM: CT ANGIOGRAPHY HEAD AND NECK TECHNIQUE: Multidetector CT imaging of the head and neck was performed using the standard protocol during bolus administration of intravenous contrast. Multiplanar CT image reconstructions and MIPs were obtained to evaluate the vascular anatomy. Carotid stenosis measurements (when applicable) are obtained utilizing NASCET criteria, using the distal internal carotid diameter as the denominator. CONTRAST:  50 cc Isovue 370 COMPARISON:  MRI of the head May 02, 2017 and CT HEAD April 29, 2017 FINDINGS: CT HEAD FINDINGS BRAIN: No intraparenchymal hemorrhage, mass effect nor midline shift. Patchy supratentorial white matter are type with densities. The ventricles and sulci are mildly prominent for age. No acute large vascular territory infarcts. No abnormal extra-axial fluid collections. Basal cisterns are patent. VASCULAR: Unremarkable. SKULL/SOFT TISSUES: No skull fracture. No significant soft tissue swelling. ORBITS/SINUSES: The included ocular globes and orbital contents are normal.The mastoid aircells and included paranasal sinuses are well-aerated. OTHER: None. CTA NECK AORTIC ARCH:  Normal appearance of the thoracic arch, 2 vessel arch is a normal variant. The origins of the innominate, left Common carotid artery and subclavian artery are widely patent. RIGHT CAROTID SYSTEM: Common carotid artery is widely patent, coursing in a straight line fashion. Mild eccentric calcific atherosclerosis of the carotid bifurcation without hemodynamically significant stenosis by NASCET criteria. Normal appearance of the included internal carotid artery. LEFT CAROTID SYSTEM: Common carotid artery is widely patent, coursing in a straight line fashion. Mild eccentric calcific atherosclerosis of the carotid bifurcation without hemodynamically significant stenosis by NASCET criteria. Normal appearance of the included internal carotid artery. VERTEBRAL ARTERIES:The codominant vertebral artery's. Normal appearance of the vertebral arteries, which appear widely patent. SKELETON: No acute osseous process though bone windows have not been submitted. OTHER NECK: Soft tissues of the neck are nonacute though, not tailored for evaluation. UPPER CHEST: Included lung apices are clear. No superior mediastinal lymphadenopathy. CTA HEAD ANTERIOR CIRCULATION: Patent cervical internal carotid  arteries, petrous, cavernous and supra clinoid internal carotid arteries. Widely patent anterior communicating artery. Patent anterior and middle cerebral arteries. Mild luminal irregularity bilateral mid to distal middle cerebral artery's. No large vessel occlusion, significant stenosis, contrast extravasation or aneurysm. POSTERIOR CIRCULATION: Patent vertebral arteries, vertebrobasilar junction and basilar artery, as well as main branch vessels. Patent posterior cerebral arteries. Severe stenosis RIGHT proximal P2 segment, moderate stenosis RIGHT mid P1 segment. Mild luminal irregularity LEFT posterior cerebral artery. No large vessel occlusion, contrast extravasation or aneurysm. VENOUS SINUSES: Major dural venous sinuses are patent  though not tailored for evaluation on this angiographic examination. ANATOMIC VARIANTS: None. DELAYED PHASE: No abnormal intracranial enhanced. MIP images reviewed. IMPRESSION: CT HEAD: 1. No acute intracranial process ; patient's known acute small infarcts not apparent by CT. 2. Mild chronic small vessel ischemic disease. 3. Borderline parenchymal brain volume loss for age. CTA NECK: 1. Mild atherosclerosis without hemodynamically significant stenosis or acute vascular process. CTA HEAD: 1. No emergent large vessel occlusion. 2. Severe stenosis RIGHT proximal P2 segment. Moderate stenosis RIGHT P1 segment. 3. Mild cerebral artery luminal irregularity compatible with atherosclerosis. Electronically Signed   By: Awilda Metro M.D.   On: 05/03/2017 02:04   Ct Angio Neck W Or Wo Contrast  Result Date: 05/03/2017 CLINICAL DATA:  Follow-up stroke.  History of hypertension. EXAM: CT ANGIOGRAPHY HEAD AND NECK TECHNIQUE: Multidetector CT imaging of the head and neck was performed using the standard protocol during bolus administration of intravenous contrast. Multiplanar CT image reconstructions and MIPs were obtained to evaluate the vascular anatomy. Carotid stenosis measurements (when applicable) are obtained utilizing NASCET criteria, using the distal internal carotid diameter as the denominator. CONTRAST:  50 cc Isovue 370 COMPARISON:  MRI of the head May 02, 2017 and CT HEAD April 29, 2017 FINDINGS: CT HEAD FINDINGS BRAIN: No intraparenchymal hemorrhage, mass effect nor midline shift. Patchy supratentorial white matter are type with densities. The ventricles and sulci are mildly prominent for age. No acute large vascular territory infarcts. No abnormal extra-axial fluid collections. Basal cisterns are patent. VASCULAR: Unremarkable. SKULL/SOFT TISSUES: No skull fracture. No significant soft tissue swelling. ORBITS/SINUSES: The included ocular globes and orbital contents are normal.The mastoid aircells and  included paranasal sinuses are well-aerated. OTHER: None. CTA NECK AORTIC ARCH: Normal appearance of the thoracic arch, 2 vessel arch is a normal variant. The origins of the innominate, left Common carotid artery and subclavian artery are widely patent. RIGHT CAROTID SYSTEM: Common carotid artery is widely patent, coursing in a straight line fashion. Mild eccentric calcific atherosclerosis of the carotid bifurcation without hemodynamically significant stenosis by NASCET criteria. Normal appearance of the included internal carotid artery. LEFT CAROTID SYSTEM: Common carotid artery is widely patent, coursing in a straight line fashion. Mild eccentric calcific atherosclerosis of the carotid bifurcation without hemodynamically significant stenosis by NASCET criteria. Normal appearance of the included internal carotid artery. VERTEBRAL ARTERIES:The codominant vertebral artery's. Normal appearance of the vertebral arteries, which appear widely patent. SKELETON: No acute osseous process though bone windows have not been submitted. OTHER NECK: Soft tissues of the neck are nonacute though, not tailored for evaluation. UPPER CHEST: Included lung apices are clear. No superior mediastinal lymphadenopathy. CTA HEAD ANTERIOR CIRCULATION: Patent cervical internal carotid arteries, petrous, cavernous and supra clinoid internal carotid arteries. Widely patent anterior communicating artery. Patent anterior and middle cerebral arteries. Mild luminal irregularity bilateral mid to distal middle cerebral artery's. No large vessel occlusion, significant stenosis, contrast extravasation or aneurysm. POSTERIOR CIRCULATION: Patent vertebral arteries, vertebrobasilar  junction and basilar artery, as well as main branch vessels. Patent posterior cerebral arteries. Severe stenosis RIGHT proximal P2 segment, moderate stenosis RIGHT mid P1 segment. Mild luminal irregularity LEFT posterior cerebral artery. No large vessel occlusion, contrast  extravasation or aneurysm. VENOUS SINUSES: Major dural venous sinuses are patent though not tailored for evaluation on this angiographic examination. ANATOMIC VARIANTS: None. DELAYED PHASE: No abnormal intracranial enhanced. MIP images reviewed. IMPRESSION: CT HEAD: 1. No acute intracranial process ; patient's known acute small infarcts not apparent by CT. 2. Mild chronic small vessel ischemic disease. 3. Borderline parenchymal brain volume loss for age. CTA NECK: 1. Mild atherosclerosis without hemodynamically significant stenosis or acute vascular process. CTA HEAD: 1. No emergent large vessel occlusion. 2. Severe stenosis RIGHT proximal P2 segment. Moderate stenosis RIGHT P1 segment. 3. Mild cerebral artery luminal irregularity compatible with atherosclerosis. Electronically Signed   By: Awilda Metro M.D.   On: 05/03/2017 02:04   Mr Brain Wo Contrast  Result Date: 05/02/2017 CLINICAL DATA:  Altered mental status. EXAM: MRI HEAD WITHOUT CONTRAST TECHNIQUE: Multiplanar, multiecho pulse sequences of the brain and surrounding structures were obtained without intravenous contrast. COMPARISON:  Head CT 3 days ago FINDINGS: Brain: 6 mm acute cortical infarct in the parasagittal left occipital cortex. Abnormal but less intense diffusion hyperintensity in the deep right cerebral white matter (posterior corona radiata), and cortex of the right occipital temporal lobe - these are in the right MCA/ posterior border zone distribution. There is patchy FLAIR hyperintensity in the cerebral white matter, mild but abnormal for age. Patient's history of diabetes and hypertension and this is likely premature chronic microvascular ischemia. No typical multiple sclerosis type pattern. No hemorrhage, hydrocephalus, or masslike findings. Vascular: Major flow voids are preserved Skull and upper cervical spine: Negative for marrow lesion Sinuses/Orbits: Negative IMPRESSION: 1. Subcentimeter acute infarct in the left occipital  cortex. 2 even smaller and likely subacute infarcts in the posterior right cerebrum. 2. Mild but premature white matter signal abnormality, patient's medical history suggesting chronic small vessel ischemia. Electronically Signed   By: Marnee Spring M.D.   On: 05/02/2017 14:01    Imaging/Diagnostic Tests: Dg Chest 2 View 04/29/2017 . FINDINGS: The heart size and mediastinal contours are within normal limits. Both lungs are clear. The visualized skeletal structures are unremarkable.  IMPRESSION: No active cardiopulmonary disease.   Ct Head Wo Contrast 04/29/2017 FINDINGS: Brain: No evidence of acute infarction, hemorrhage, hydrocephalus, extra-axial collection or mass lesion/mass effect. Vascular: No hyperdense vessel or unexpected calcification. Skull: Normal. Negative for fracture or focal lesion. Sinuses/Orbits: No acute finding. Other: None.  IMPRESSION:  1. Negative for bleed or other acute intracranial process.    Romell Wolden, [place], DO 05/03/2017, 6:49 AM PGY-1, [facility] FPTS Intern pager: [phone], text pages welcome

## 2017-05-03 NOTE — Progress Notes (Signed)
OT Cancellation Note  Patient Details Name: Thomas Burch MRN: [id number] DOB: 1986/05/24   Cancelled Treatment:    Reason Eval/Treat Not Completed: OT screened, no needs identified, will sign off. After consulting with PT there are no OT needs identified. Should the situation change please feel free to re-order. Thank you!  Evern Bio Philippa Vessey 05/03/2017, 1:27 PM  Sherryl Manges OTR/L [phone]

## 2017-05-03 NOTE — Progress Notes (Signed)
STROKE TEAM PROGRESS NOTE   HISTORY OF PRESENT ILLNESS (per record) Thomas Burch is an 31 y.o. male with a history of headache and hypertension presented to who states on Sunday approximate about once time he was talking with his friends when he suddenly noted that he had the inability to express himself. In the ED patient was found to be altered however he did have a low-grade fever and elevated white blood cell count. Patient had LP which was insignificant at that time. He states he knew that he wanted to say however when he tried to get the words out that didn't come out. If he thought for second towards were able to come out. He has never had symptoms like this before. He does admit that he has not been compliant with his medications. He knows he has hypertension but has been taking the medication only when he wants to. He found out during this hospital visit that he is diabetic. Other than the speech issues he had no other findings such as decreased sensation, blurred vision, decreased vision, or weakness. He does not know of any family members who had any hypercoagulable states. He does not take aspirin on a daily basis. Patient states he does drink occasionally but he is not a heavy drinker. He does not take part in any illicit drugs such as cocaine or stimulants  Date last known well: Date: 04/30/2017 Time last known well: Time: 12:00 tPA Given: No: Out of window Modified Rankin: Rankin Score=0  NIH stroke score of 0   SUBJECTIVE (INTERVAL HISTORY) No family is at the bedside.  He is at his neuro baseline. Speech difficulty resolved. He admitted that he did not compliant with HTN meds at home and now has newly diagnosed DM. MRI embolic pattern, needs embolic work up.    OBJECTIVE Temp:  [98 F (36.7 C)-98.8 F (37.1 C)] 98.6 F (37 C) (08/29 0747) Pulse Rate:  [78-112] 109 (08/29 0747) Cardiac Rhythm: Sinus tachycardia (08/28 1900) Resp:  [18-20] 20 (08/29 0747) BP:  (153-188)/(101-129) 159/108 (08/29 0747) SpO2:  [95 %-100 %] 100 % (08/29 0747) Weight:  [111.1 kg (245 lb)] 111.1 kg (245 lb) (08/28 2105)  CBC:  Recent Labs Lab 04/29/17 1005  05/02/17 0443 05/03/17 0352  WBC 18.4*  < > 13.3* 14.1*  NEUTROABS 15.7*  --   --   --   HGB 17.6*  < > 15.8 16.6  HCT 49.7  < > 48.1 49.1  MCV 84.2  < > 87.1 86.4  PLT 280  < > 269 263  < > = values in this interval not displayed.  Basic Metabolic Panel:  Recent Labs Lab 05/02/17 0432 05/03/17 0352  NA 136 134*  K 3.2* 3.3*  CL 98* 98*  CO2 28 24  GLUCOSE 240* 239*  BUN 9 11  CREATININE 0.87 0.90  CALCIUM 8.9 8.9    Lipid Panel:    Component Value Date/Time   CHOL 185 05/03/2017 0352   TRIG 178 (H) 05/03/2017 0352   HDL 34 (L) 05/03/2017 0352   CHOLHDL 5.4 05/03/2017 0352   VLDL 36 05/03/2017 0352   LDLCALC 115 (H) 05/03/2017 0352   HgbA1c:  Lab Results  Component Value Date   HGBA1C 11.9 (H) 04/29/2017   Urine Drug Screen:    Component Value Date/Time   LABOPIA NONE DETECTED 04/29/2017 1156   COCAINSCRNUR NONE DETECTED 04/29/2017 1156   LABBENZ NONE DETECTED 04/29/2017 1156   AMPHETMU NONE DETECTED 04/29/2017 1156  THCU NONE DETECTED 04/29/2017 1156   LABBARB NONE DETECTED 04/29/2017 1156    Alcohol Level No results found for: ETH  IMAGING I have personally reviewed the radiological images below and agree with the radiology interpretations.  Ct Angio Head and neck W Or Wo Contrast 05/03/2017 IMPRESSION: CT HEAD: 1. No acute intracranial process ; patient's known acute small infarcts not apparent by CT. 2. Mild chronic small vessel ischemic disease. 3. Borderline parenchymal brain volume loss for age. CTA NECK: 1. Mild atherosclerosis without hemodynamically significant stenosis or acute vascular process. CTA HEAD: 1. No emergent large vessel occlusion. 2. Severe stenosis RIGHT proximal P2 segment. Moderate stenosis RIGHT P1 segment. 3. Mild cerebral artery luminal irregularity  compatible with atherosclerosis.  Mr Brain Wo Contrast 05/02/2017 IMPRESSION: 1. 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 2 even smaller and likely subacute infarcts in the posterior right cerebrum. 2. Mild but premature white matter signal abnormality, patient's medical history suggesting chronic small vessel ischemia.   TTE pending  TEE pending  TCD bubble study pending  LE venous doppler pending    PHYSICAL EXAM  Temp:  [98 F (36.7 C)-98.8 F (37.1 C)] 98.6 F (37 C) (08/29 0747) Pulse Rate:  [78-112] 109 (08/29 0747) Resp:  [18-20] 20 (08/29 0747) BP: (153-182)/(101-129) 159/108 (08/29 0747) SpO2:  [95 %-100 %] 100 % (08/29 0747) Weight:  [245 lb (111.1 kg)] 245 lb (111.1 kg) (08/28 2105)  General - Well nourished, well developed, in no apparent distress.  Ophthalmologic - Sharp disc margins OU.   Cardiovascular - Regular rate and rhythm.  Mental Status -  Level of arousal and orientation to time, place, and person were intact. Language including expression, naming, repetition, comprehension was assessed and found intact. Attention span and concentration were normal. Fund of Knowledge was assessed and was intact.  Cranial Nerves II - XII - II - Visual field intact OU. III, IV, VI - Extraocular movements intact. V - Facial sensation intact bilaterally. VII - Facial movement intact bilaterally. VIII - Hearing & vestibular intact bilaterally. X - Palate elevates symmetrically. XI - Chin turning & shoulder shrug intact bilaterally. XII - Tongue protrusion intact.  Motor Strength - The patient's strength was normal in all extremities and pronator drift was absent.  Bulk was normal and fasciculations were absent.   Motor Tone - Muscle tone was assessed at the neck and appendages and was normal.  Reflexes - The patient's reflexes were 1+ in all extremities and he had no pathological reflexes.  Sensory - Light touch, temperature/pinprick, vibration  and proprioception, and Romberg testing were assessed and were symmetrical.    Coordination - The patient had normal movements in the hands and feet with no ataxia or dysmetria.  Tremor was absent.  Gait and Station - The patient's transfers, posture, gait, station, and turns were observed as normal.   ASSESSMENT/PLAN Mr. KUSHAL SAUNDERS is a 31 y.o. male with history of HTN presenting with speech difficulty. He did not receive IV t-PA due to out of window. Speech improved over time. Found to have newly diagnosed DM.    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 Need to rule out endocarditis and paradoxical emboli.  Resultant  Deficit resolved  CT head no acute abnormalities  MRI head  Subcentimeter acute infarct in the left occipital cortex. 2 even smaller and likely subacute infarcts in the posterior right cerebrum.  CTA head and neck - right P2  stenosis  LE venous doppler pending  2D Echo  pending  TCD bubble study pending  Recommend TEE to rule out endocarditis due to low grade fever and leukocytosis  LDL 115  HgbA1c 11.9  UDS neg  CSF negative for CNS infection  lovenox for VTE prophylaxis  Diet heart healthy/carb modified Room service appropriate? Yes; Fluid consistency: Thin  No antithrombotic prior to admission, now on aspirin 325 mg daily and clopidogrel 75 mg daily. DAPT for 3 months and then either ASA or plavix for intracranial stenosis and non-disabling mild stroke according to POINT trial  Patient counseled to be compliant with his antithrombotic medications  Ongoing aggressive stroke risk factor management  Therapy recommendations:  pending  Disposition:  pending  Hypertension  Unstable  On clonidien, HCTZ and lisinopril  BP goal normotensive  Hyperlipidemia  Home meds:  none  LDL 115, goal < 70  Add lipitor 80  Continue statin at discharge  Diabetes, new diagnosis, likely  type I  HgbA1c 11.5, goal < 7.0  Uncontrolled  DM education  SSI  On lantus  Close PCP follow up  Other Stroke Risk Factors  Obesity, Body mass index is 35.15 kg/m., recommend weight loss, diet and exercise as appropriate   Migraines  Other Active Problems  Leukocytosis - 18.4->15.9->14.1  Blood culture - staph with coagulase neg - likely contamination  Hospital day # 2  Marvel PlanJindong Evett Kassa, MD PhD Stroke Neurology 05/03/2017 12:17 PM    To contact Stroke Continuity provider, please refer to [email or web address]. After hours, contact General Neurology

## 2017-05-03 NOTE — Progress Notes (Signed)
Inpatient Diabetes Program Recommendations  AACE/ADA: New Consensus Statement on Inpatient Glycemic Control (2015)  Target Ranges:  Prepandial:   less than 140 mg/dL      Peak postprandial:   less than 180 mg/dL (1-2 hours)      Critically ill patients:  140 - 180 mg/dL   Spoke with patient about new diabetes diagnosis.  Discussed A1C results (11.9%) and explained what an A1C is and informed patient that his current A1C indicates an average glucose of 290 mg/dl over the past 2-3 months. Discussed basic pathophysiology of DM Type 2, basic home care, importance of checking CBGs and maintaining good CBG control to prevent long-term and short-term complications. Reviewed glucose and A1C goals.  Reviewed signs and symptoms of hyperglycemia and hypoglycemia along with treatment for both. Discussed impact of nutrition, exercise, stress, sickness, and medications on diabetes control. Reviewed Living Well with diabetes booklet and encouraged patient to read through entire book.  Asked patient to check his glucose 2 times per day (Fasting and alternating second check). Explained how the doctor he follows up with can use the log book to continue to make insulin adjustments if needed.   Educated patient on insulin pen use at home. Reviewed contents of insulin flexpen starter kit. Reviewed all steps if insulin pen including attachment of needle, 2-unit air shot, dialing up dose, giving injection, removing needle, disposal of sharps, storage of unused insulin, disposal of insulin etc. Patient able to provide successful return demonstration. Also reviewed troubleshooting with insulin pen. MD to give patient Rxs for insulin pens and insulin pen needles.  Patient verbalized understanding of information discussed and he states that he has no further questions at this time related to diabetes.   RNs to provide ongoing basic DM education at bedside with this patient and engage patient to actively check blood glucose and  administer insulin injections.   Thanks, Tama Headings RN, MSN, [facility] Inpatient Diabetes Coordinator Team Pager [phone] (8a-5p)

## 2017-05-03 NOTE — Progress Notes (Signed)
    [facility] has been requested to perform a transesophageal echocardiogram on Thomas Burch for stroke.  After careful review of history and examination, the risks and benefits of transesophageal echocardiogram have been explained including risks of esophageal damage, perforation (1:10,000 risk), bleeding, pharyngeal hematoma as well as other potential complications associated with conscious sedation including aspiration, arrhythmia, respiratory failure and death. Alternatives to treatment were discussed, questions were answered. Patient is willing to proceed.   The TEE procedure is scheduled for 05/04/17 at 08:00 with Dr. Elease HashimotoNahser.  Berton BonJanine Tessla Spurling, NP  05/03/2017 1:03 PM

## 2017-05-03 NOTE — Progress Notes (Signed)
*  PRELIMINARY RESULTS* Vascular Ultrasound Transcranial Doppler with Bubbles has been completed with Dr. Roda ShuttersXu. No high intensity transient signals (HITS) were heard at rest or with Valsalva. Therefore, there is no evidence of patent foramen ovale (PFO).  05/03/2017 1:39 PM Gertie FeyMichelle Abram Sax, BS, RVT, RDCS, RDMS

## 2017-05-03 NOTE — Anesthesia Preprocedure Evaluation (Signed)
Anesthesia Evaluation    Airway Mallampati: II  TM Distance: >3 FB Neck ROM: Full    Dental no notable dental hx.    Pulmonary    Pulmonary exam normal breath sounds clear to auscultation       Cardiovascular hypertension, Pt. on medications Normal cardiovascular exam Rhythm:Regular Rate:Normal     Neuro/Psych  Headaches, CVA    GI/Hepatic   Endo/Other  diabetes  Renal/GU      Musculoskeletal   Abdominal   Peds  Hematology   Anesthesia Other Findings Echo 8/18 Study Conclusions  - Left ventricle: Wall thickness was increased in a pattern of   moderate LVH. There was focal basal hypertrophy. Systolic   function was normal. The estimated ejection fraction was in the   range of 50% to 55%. Features are consistent with a pseudonormal   left ventricular filling pattern, with concomitant abnormal   relaxation and increased filling pressure (grade 2 diastolic   dysfunction). - Aortic valve: Valve area (Vmax): 3.01 cm^2.  Reproductive/Obstetrics                             Anesthesia Physical Anesthesia Plan  ASA: II  Anesthesia Plan: MAC   Post-op Pain Management:    Induction: Intravenous  PONV Risk Score and Plan:   Airway Management Planned: Mask, Natural Airway and Nasal Cannula  Additional Equipment:   Intra-op Plan:   Post-operative Plan:   Informed Consent:   Plan Discussed with:   Anesthesia Plan Comments:         Anesthesia Quick Evaluation

## 2017-05-03 NOTE — Evaluation (Signed)
Physical Therapy Evaluation Patient Details Name: ADYEN BIFULCO MRN: [id number] DOB: 1986-03-28 Today's Date: 05/03/2017   History of Present Illness  JJ ENYEART is a 31 y.o. male presenting with with AMS and low-grade fever. PMH is significant for poorly controlled hypertension obesity; workup for stroke, TIA underway  Clinical Impression   Patient evaluated by Physical Therapy with no further acute PT needs identified. All education has been completed and the patient has no further questions. We discussed signs and symptoms of stroke, and risk factors;  See below for any follow-up Physical Therapy or equipment needs. PT is signing off. Thank you for this referral.     Follow Up Recommendations No PT follow up    Equipment Recommendations  None recommended by PT    Recommendations for Other Services       Precautions / Restrictions Precautions Precautions: None      Mobility  Bed Mobility Overal bed mobility: Independent                Transfers Overall transfer level: Independent                  Ambulation/Gait Ambulation/Gait assistance: Independent Ambulation Distance (Feet): 400 Feet Assistive device: None Gait Pattern/deviations: WFL(Within Functional Limits)     General Gait Details: No difficulty  Stairs            Wheelchair Mobility    Modified Rankin (Stroke Patients Only) Modified Rankin (Stroke Patients Only) Pre-Morbid Rankin Score: No symptoms Modified Rankin: No symptoms     Balance Overall balance assessment: Independent                                           Pertinent Vitals/Pain Pain Assessment: No/denies pain    Home Living Family/patient expects to be discharged to:: Private residence Living Arrangements: Other relatives Available Help at Discharge: Family;Available PRN/intermittently Type of Home: House Home Access: Stairs to enter   [employer] of Steps: 1           Prior Function Level of Independence: Independent               Hand Dominance        Extremity/Trunk Assessment   Upper Extremity Assessment Upper Extremity Assessment: Overall WFL for tasks assessed    Lower Extremity Assessment Lower Extremity Assessment: Overall WFL for tasks assessed    Cervical / Trunk Assessment Cervical / Trunk Assessment: Normal  Communication   Communication: No difficulties  Cognition Arousal/Alertness: Awake/alert Behavior During Therapy: WFL for tasks assessed/performed Overall Cognitive Status: Within Functional Limits for tasks assessed                                        General Comments      Exercises     Assessment/Plan    PT Assessment Patent does not need any further PT services  PT Problem List         PT Treatment Interventions      PT Goals (Current goals can be found in the Care Plan section)  Acute Rehab PT Goals Patient Stated Goal: HOme soon PT Goal Formulation: All assessment and education complete, DC therapy    Frequency     Barriers to discharge  Co-evaluation               AM-PAC PT "6 Clicks" Daily Activity  Outcome Measure Difficulty turning over in bed (including adjusting bedclothes, sheets and blankets)?: None Difficulty moving from lying on back to sitting on the side of the bed? : None Difficulty sitting down on and standing up from a chair with arms (e.g., wheelchair, bedside commode, etc,.)?: None Help needed moving to and from a bed to chair (including a wheelchair)?: None Help needed walking in hospital room?: None Help needed climbing 3-5 steps with a railing? : None 6 Click Score: 24    End of Session   Activity Tolerance: Patient tolerated treatment well Patient left:  (managing independently in room) Nurse Communication: Mobility status PT Visit Diagnosis: Other symptoms and signs involving the nervous system (R29.898)    Time: [id number] PT  Time Calculation (min) (ACUTE ONLY): 14 min   Charges:   PT Evaluation $PT Eval Low Complexity: 1 Low     PT G Codes:        Van ClinesHolly Larwence Tu, PT  Acute Rehabilitation Services Pager [phone] Office [phone]   Levi AlandHolly H Haru Shaff 05/03/2017, 1:14 PM

## 2017-05-03 NOTE — Progress Notes (Signed)
  Echocardiogram 2D Echocardiogram has been performed.  Mairi Stagliano T Noemy Hallmon 05/03/2017, 8:48 AM

## 2017-05-04 ENCOUNTER — Inpatient Hospital Stay (HOSPITAL_COMMUNITY): Payer: Commercial Managed Care - PPO

## 2017-05-04 ENCOUNTER — Encounter (HOSPITAL_COMMUNITY)
Admission: EM | Disposition: A | Discharge status: Home / Self Care | Payer: Self-pay | Source: Home / Self Care | Attending: Family Medicine

## 2017-05-04 ENCOUNTER — Inpatient Hospital Stay (HOSPITAL_COMMUNITY): Payer: Commercial Managed Care - PPO | Admitting: Certified Registered Nurse Anesthetist

## 2017-05-04 ENCOUNTER — Ambulatory Visit (HOSPITAL_COMMUNITY)
Admission: RE | Admit: 2017-05-04 | Payer: Commercial Managed Care - PPO | Source: Ambulatory Visit | Admitting: Cardiovascular Disease

## 2017-05-04 ENCOUNTER — Other Ambulatory Visit: Payer: Self-pay | Admitting: Cardiology

## 2017-05-04 ENCOUNTER — Encounter (HOSPITAL_COMMUNITY): Payer: Self-pay | Admitting: Certified Registered Nurse Anesthetist

## 2017-05-04 DIAGNOSIS — D72829 Elevated white blood cell count, unspecified: Secondary | ICD-10-CM

## 2017-05-04 DIAGNOSIS — I638 Other cerebral infarction: Secondary | ICD-10-CM

## 2017-05-04 DIAGNOSIS — I639 Cerebral infarction, unspecified: Secondary | ICD-10-CM

## 2017-05-04 HISTORY — PX: TEE WITHOUT CARDIOVERSION: SHX5443

## 2017-05-04 LAB — VAS US CAROTID
LCCAPDIAS: 26 cm/s
LCCAPSYS: 95 cm/s
LEFT ECA DIAS: -21 cm/s
LEFT VERTEBRAL DIAS: 15 cm/s
LICADDIAS: -36 cm/s
LICADSYS: -84 cm/s
LICAPSYS: -55 cm/s
Left CCA dist dias: -30 cm/s
Left CCA dist sys: -73 cm/s
Left ICA prox dias: -21 cm/s
RCCAPSYS: 83 cm/s
RIGHT ECA DIAS: -30 cm/s
RIGHT VERTEBRAL DIAS: 15 cm/s
Right CCA prox dias: 27 cm/s
Right cca dist sys: -92 cm/s

## 2017-05-04 LAB — CBC
HEMATOCRIT: 51.1 % (ref 39.0–52.0)
HEMOGLOBIN: 17 g/dL (ref 13.0–17.0)
MCH: 29.4 pg (ref 26.0–34.0)
MCHC: 33.3 g/dL (ref 30.0–36.0)
MCV: 88.4 fL (ref 78.0–100.0)
Platelets: 287 10*3/uL (ref 150–400)
RBC: 5.78 MIL/uL (ref 4.22–5.81)
RDW: 13.1 % (ref 11.5–15.5)
WBC: 15.3 10*3/uL — AB (ref 4.0–10.5)

## 2017-05-04 LAB — BASIC METABOLIC PANEL
ANION GAP: 10 (ref 5–15)
BUN: 15 mg/dL (ref 6–20)
CHLORIDE: 99 mmol/L — AB (ref 101–111)
CO2: 27 mmol/L (ref 22–32)
Calcium: 9.4 mg/dL (ref 8.9–10.3)
Creatinine, Ser: 0.93 mg/dL (ref 0.61–1.24)
GFR calc non Af Amer: >60 mL/min (ref >60)
GLUCOSE: 166 mg/dL — AB (ref 65–99)
Potassium: 3.5 mmol/L (ref 3.5–5.1)
Sodium: 136 mmol/L (ref 135–145)

## 2017-05-04 LAB — CULTURE, BLOOD (ROUTINE X 2)
CULTURE: NO GROWTH
SPECIAL REQUESTS: ADEQUATE

## 2017-05-04 LAB — C-REACTIVE PROTEIN: CRP: 1.1 mg/dL — ABNORMAL HIGH (ref <1.0)

## 2017-05-04 LAB — GLUCOSE, CAPILLARY
GLUCOSE-CAPILLARY: 199 mg/dL — AB (ref 65–99)
Glucose-Capillary: 177 mg/dL — ABNORMAL HIGH (ref 65–99)
Glucose-Capillary: 211 mg/dL — ABNORMAL HIGH (ref 65–99)

## 2017-05-04 LAB — ANTITHROMBIN III: AntiThromb III Func: 111 % (ref 75–120)

## 2017-05-04 LAB — SEDIMENTATION RATE: SED RATE: 3 mm/h (ref 0–16)

## 2017-05-04 SURGERY — LOOP RECORDER INSERTION

## 2017-05-04 SURGERY — ECHOCARDIOGRAM, TRANSESOPHAGEAL
Anesthesia: Monitor Anesthesia Care

## 2017-05-04 MED ORDER — PHENYLEPHRINE HCL 10 MG/ML IJ SOLN
INTRAMUSCULAR | Status: DC | PRN
  Administered 2017-05-04 (×3): 120 ug via INTRAVENOUS
  Administered 2017-05-04: 80 ug via INTRAVENOUS

## 2017-05-04 MED ORDER — LISINOPRIL 40 MG PO TABS: 40.0000 mg | ORAL_TABLET | Freq: Every day | ORAL | 0 refills | Status: DC

## 2017-05-04 MED ORDER — METOPROLOL TARTRATE 5 MG/5ML IV SOLN
INTRAVENOUS | Status: AC
  Filled 2017-05-04: qty 5

## 2017-05-04 MED ORDER — ATORVASTATIN CALCIUM 80 MG PO TABS: 80.0000 mg | ORAL_TABLET | Freq: Every day | ORAL | 0 refills | Status: DC

## 2017-05-04 MED ORDER — INSULIN PEN NEEDLE 31G X 5 MM MISC: 1.0000 "application " | Freq: Once | 0 refills | Status: AC

## 2017-05-04 MED ORDER — ASPIRIN 325 MG PO TBEC
325.0000 mg | DELAYED_RELEASE_TABLET | Freq: Every day | ORAL | 0 refills | Status: DC
Start: 2017-05-05 — End: 2017-06-30

## 2017-05-04 MED ORDER — CLONIDINE HCL 0.2 MG PO TABS: 0.2000 mg | ORAL_TABLET | Freq: Two times a day (BID) | ORAL | 0 refills | Status: DC

## 2017-05-04 MED ORDER — PROPOFOL 500 MG/50ML IV EMUL
INTRAVENOUS | Status: DC | PRN
  Administered 2017-05-04: 150 ug/kg/min via INTRAVENOUS

## 2017-05-04 MED ORDER — CLOPIDOGREL BISULFATE 75 MG PO TABS: 75.0000 mg | ORAL_TABLET | Freq: Every day | ORAL | 0 refills | Status: DC

## 2017-05-04 MED ORDER — LIDOCAINE HCL (CARDIAC) 20 MG/ML IV SOLN
INTRAVENOUS | Status: DC | PRN
  Administered 2017-05-04: 100 mg via INTRATRACHEAL

## 2017-05-04 MED ORDER — HYDROCHLOROTHIAZIDE 25 MG PO TABS: 25.0000 mg | ORAL_TABLET | Freq: Every day | ORAL | 0 refills | Status: DC

## 2017-05-04 MED ORDER — BLOOD GLUCOSE METER KIT: PACK | 0 refills | Status: DC

## 2017-05-04 MED ORDER — HYDRALAZINE HCL 20 MG/ML IJ SOLN
INTRAMUSCULAR | Status: AC
  Filled 2017-05-04: qty 1

## 2017-05-04 MED ORDER — SODIUM CHLORIDE 0.9 % IV SOLN
INTRAVENOUS | Status: DC
  Administered 2017-05-04 (×2): via INTRAVENOUS

## 2017-05-04 MED ORDER — PROPOFOL 10 MG/ML IV BOLUS
INTRAVENOUS | Status: DC | PRN
  Administered 2017-05-04 (×2): 15 mg via INTRAVENOUS
  Administered 2017-05-04 (×2): 20 mg via INTRAVENOUS

## 2017-05-04 MED ORDER — INSULIN GLARGINE 100 UNIT/ML SOLOSTAR PEN: 13.0000 [IU] | PEN_INJECTOR | Freq: Every morning | SUBCUTANEOUS | 1 refills | Status: DC

## 2017-05-04 MED ORDER — HYDRALAZINE HCL 20 MG/ML IJ SOLN
10.0000 mg | Freq: Once | INTRAMUSCULAR | Status: AC
  Administered 2017-05-04: 10 mg via INTRAVENOUS

## 2017-05-04 NOTE — Interval H&P Note (Signed)
History and Physical Interval Note:  05/04/2017 8:08 AM  Thomas Burch  has presented today for surgery, with the diagnosis of STROKE  The various methods of treatment have been discussed with the patient and family. After consideration of risks, benefits and other options for treatment, the patient has consented to  Procedure(s): TRANSESOPHAGEAL ECHOCARDIOGRAM (TEE) (N/A) as a surgical intervention .  The patient's history has been reviewed, patient examined, no change in status, stable for surgery.  I have reviewed the patient's chart and labs.  Questions were answered to the patient's satisfaction.     Kristeen MissPhilip Laronda Lisby

## 2017-05-04 NOTE — Progress Notes (Addendum)
STROKE TEAM PROGRESS NOTE   SUBJECTIVE (INTERVAL HISTORY) His mother is at the bedside.  Per mother, pt was once thin, and has gained weight rapidly over the past two years since living on his own.  TEE completed today, no source of cardioembolism identified.  30 day event monitor requested.     OBJECTIVE Temp:  [98 F (36.7 C)-98.6 F (37 C)] 98.5 F (36.9 C) (08/30 0936) Pulse Rate:  [96-102] 102 (08/30 0936) Cardiac Rhythm: Normal sinus rhythm (08/30 0500) Resp:  [10-19] 16 (08/30 0936) BP: (122-192)/(68-139) 155/99 (08/30 0936) SpO2:  [93 %-99 %] 99 % (08/30 0936)  CBC:  Recent Labs Lab 04/29/17 1005  05/03/17 0352 05/04/17 0359  WBC 18.4*  < > 14.1* 15.3*  NEUTROABS 15.7*  --   --   --   HGB 17.6*  < > 16.6 17.0  HCT 49.7  < > 49.1 51.1  MCV 84.2  < > 86.4 88.4  PLT 280  < > 263 287  < > = values in this interval not displayed.  Basic Metabolic Panel:   Recent Labs Lab 05/03/17 0352 05/04/17 0359  NA 134* 136  K 3.3* 3.5  CL 98* 99*  CO2 24 27  GLUCOSE 239* 166*  BUN 11 15  CREATININE 0.90 0.93  CALCIUM 8.9 9.4    Lipid Panel:     Component Value Date/Time   CHOL 185 05/03/2017 0352   TRIG 178 (H) 05/03/2017 0352   HDL 34 (L) 05/03/2017 0352   CHOLHDL 5.4 05/03/2017 0352   VLDL 36 05/03/2017 0352   LDLCALC 115 (H) 05/03/2017 0352   HgbA1c:  Lab Results  Component Value Date   HGBA1C 11.9 (H) 04/29/2017   Urine Drug Screen:     Component Value Date/Time   LABOPIA NONE DETECTED 04/29/2017 1156   COCAINSCRNUR NONE DETECTED 04/29/2017 1156   LABBENZ NONE DETECTED 04/29/2017 1156   AMPHETMU NONE DETECTED 04/29/2017 1156   THCU NONE DETECTED 04/29/2017 1156   LABBARB NONE DETECTED 04/29/2017 1156    Alcohol Level No results found for: ETH  IMAGING I have personally reviewed the radiological images below and agree with the radiology interpretations.  Ct Angio Head and neck W Or Wo Contrast 05/03/2017 IMPRESSION: CT HEAD: 1. No acute  intracranial process ; patient's known acute small infarcts not apparent by CT. 2. Mild chronic small vessel ischemic disease. 3. Borderline parenchymal brain volume loss for age. CTA NECK: 1. Mild atherosclerosis without hemodynamically significant stenosis or acute vascular process. CTA HEAD: 1. No emergent large vessel occlusion. 2. Severe stenosis RIGHT proximal P2 segment. Moderate stenosis RIGHT P1 segment. 3. Mild cerebral artery luminal irregularity compatible with atherosclerosis.  Mr Brain Wo Contrast 05/02/2017 IMPRESSION: 1. 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 2 even smaller and likely subacute infarcts in the posterior right cerebrum. 2. Mild but premature white matter signal abnormality, patient's medical history suggesting chronic small vessel ischemia.   TTE 05/03/2017 Study Conclusions - Left ventricle: Wall thickness was increased in a pattern of moderate LVH. There was focal basal hypertrophy. Systolic function was normal. The estimated ejection fraction was in the range of 50% to 55%. Features are consistent with a pseudonormal left ventricular filling pattern, with concomitant abnormal relaxation and increased filling pressure (grade 2 diastolic dysfunction). - Aortic valve: Valve area (Vmax): 3.01 cm^2.  TCD bubble study 05/04/2017 Summary: No high intensity transient signals (HITS) were heard at rest or with Valsalva. Therefore, there is no evidence of patent  foramen ovale (PFO).  Carotid US 05/02/2017 Summary: Findings suggest 1-39% internal carotid artery stenosis bilaterally. Vertebral arteries are patent with antegrade flow.  TEE 05/04/2017 Left Ventrical:  Low normal LV function  - EF 45-50% Mitral Valve: normal MV, no vegetation. Aortic Valve: normal valve, no vegetation Tricuspid Valve: normal structure.  Trivial TR , no vegetation  Pulmonic Valve: normal  Left Atrium/ Left atrial appendage: no thrombi  Atrial septum: no evidence of PFO or  ASD by color flow or bubble study  Aorta: normal aorta    PHYSICAL EXAM  Temp:  [98 F (36.7 C)-98.6 F (37 C)] 98.5 F (36.9 C) (08/30 0936) Pulse Rate:  [96-102] 102 (08/30 0936) Resp:  [10-19] 16 (08/30 0936) BP: (122-192)/(68-139) 155/99 (08/30 0936) SpO2:  [93 %-99 %] 99 % (08/30 0936)  General - Well nourished, well developed, in no apparent distress.  Ophthalmologic - Sharp disc margins OU.   Cardiovascular - Regular rate and rhythm.  Mental Status -  Level of arousal and orientation to time, place, and person were intact. Language including expression, naming, repetition, comprehension was assessed and found intact. Attention span and concentration were normal. Fund of Knowledge was assessed and was intact.  Cranial Nerves II - XII - II - Visual field intact OU. III, IV, VI - Extraocular movements intact. V - Facial sensation intact bilaterally. VII - Facial movement intact bilaterally. VIII - Hearing & vestibular intact bilaterally. X - Palate elevates symmetrically. XI - Chin turning & shoulder shrug intact bilaterally. XII - Tongue protrusion intact.  Motor Strength - The patient's strength was normal in all extremities and pronator drift was absent.  Bulk was normal and fasciculations were absent.   Motor Tone - Muscle tone was assessed at the neck and appendages and was normal.  Reflexes - The patient's reflexes were 1+ in all extremities and he had no pathological reflexes.  Sensory - Light touch, temperature/pinprick, vibration and proprioception, and Romberg testing were assessed and were symmetrical.    Coordination - The patient had normal movements in the hands and feet with no ataxia or dysmetria.  Tremor was absent.  Gait and Station - The patient's transfers, posture, gait, station, and turns were observed as normal.   ASSESSMENT/PLAN [name] is a 31 y.o. male with history of HTN presenting with speech difficulty. He did not receive  IV t-PA due to out of window. Speech improved over time. Found to have newly diagnosed DM.    Stroke:  Right CR, right temporal and left splenium punctate infarcts, embolic work up so far negative. Could be synchronized small vessel disease, however, recommend 30 day cardiac event monitoring as outpt to rule out afib.  Resultant  Deficit resolved  CT head: no acute abnormalities  MRI head: Subcentimeter acute infarct in the left occipital cortex. 2 even smaller and likely subacute infarcts in the posterior right cerebrum.  CTA head and neck: right P2 stenosis  2D Echo: Mild LVH.  Grade 2 DD.  EF 50%-55%. No source of embolus.  TCD bubble study: No PFO  TEE: unremarkable EF 45-50%  Recommend 30 day cardiac event monitoring to rule out afib  LDL 115  HgbA1c 11.9  UDS neg  CSF negative for CNS infection  lovenox for VTE prophylaxis Diet Heart Room service appropriate? Yes; Fluid consistency: Thin  No antithrombotic prior to admission, now on aspirin 325 mg daily and clopidogrel 75 mg daily. DAPT for 3 months and then either ASA or plavix for  intracranial stenosis and non-disabling mild stroke according to POINT trial  Patient counseled to be compliant with his antithrombotic medications  Ongoing aggressive stroke risk factor management  Therapy recommendations:  none  Disposition:  home  Hypertension  Unstable  On clonidine, HCTZ and lisinopril BP goal normotensive  Hyperlipidemia  Home meds: none  LDL 115, goal < 70  Add lipitor 80 mg PO daily  Continue statin at discharge  Diabetes, new diagnosis, likely type I  HgbA1c 11.5, goal < 7.0  Uncontrolled  DM education  SSI  On lantus  Close PCP follow up  Other Stroke Risk Factors  Obesity, Body mass index is 35.15 kg/m., recommend weight loss, diet and exercise as appropriate   Migraines  Other Active Problems  Leukocytosis - 18.4->15.9->14.1->15.3  Blood culture - staph with coagulase neg  - likely contamination  Reviewed tele monitoring for the last 24-48 hours, no afib found.   Hospital day # 3  Neurology will sign off. Please call with questions. Pt will follow up with Darrol Angel, NP, at [facility] in about 6 weeks. Thanks for the consult.  Marvel Plan, MD PhD Stroke Neurology 05/04/2017 3:14 PM    To contact Stroke Continuity provider, please refer to [email or web address]. After hours, contact General Neurology

## 2017-05-04 NOTE — Anesthesia Postprocedure Evaluation (Signed)
Anesthesia Post Note  Patient: Thomas Burch  Procedure(s) Performed: Procedure(s) (LRB): TRANSESOPHAGEAL ECHOCARDIOGRAM (TEE) (N/A)     Patient location during evaluation: PACU Anesthesia Type: MAC Level of consciousness: awake and alert Pain management: pain level controlled Vital Signs Assessment: post-procedure vital signs reviewed and stable Respiratory status: spontaneous breathing, nonlabored ventilation, respiratory function stable and patient connected to nasal cannula oxygen Cardiovascular status: stable and blood pressure returned to baseline Anesthetic complications: no    Last Vitals:  Vitals:   05/04/17 0844 05/04/17 0850  BP: 127/68 122/81  Pulse: 100 98  Resp: 12 10  Temp: 36.9 C   SpO2: 93% 94%    Last Pain:  Vitals:   05/04/17 0844  TempSrc: Oral  PainSc:                  Erland Vivas

## 2017-05-04 NOTE — Anesthesia Procedure Notes (Signed)
Procedure Name: MAC Date/Time: 05/04/2017 8:16 AM Performed by: Salli Quarry Shaquisha Wynn Pre-anesthesia Checklist: Patient identified, Emergency Drugs available, Suction available and Patient being monitored Patient Re-evaluated:Patient Re-evaluated prior to induction Oxygen Delivery Method: Nasal cannula

## 2017-05-04 NOTE — H&P (View-Only) (Signed)
STROKE TEAM PROGRESS NOTE   HISTORY OF PRESENT ILLNESS (per record) Thomas Burch is an 31 y.o. male with a history of headache and hypertension presented to who states on Sunday approximate about once time he was talking with his friends when he suddenly noted that he had the inability to express himself. In the ED patient was found to be altered however he did have a low-grade fever and elevated white blood cell count. Patient had LP which was insignificant at that time. He states he knew that he wanted to say however when he tried to get the words out that didn't come out. If he thought for second towards were able to come out. He has never had symptoms like this before. He does admit that he has not been compliant with his medications. He knows he has hypertension but has been taking the medication only when he wants to. He found out during this hospital visit that he is diabetic. Other than the speech issues he had no other findings such as decreased sensation, blurred vision, decreased vision, or weakness. He does not know of any family members who had any hypercoagulable states. He does not take aspirin on a daily basis. Patient states he does drink occasionally but he is not a heavy drinker. He does not take part in any illicit drugs such as cocaine or stimulants  Date last known well: Date: 04/30/2017 Time last known well: Time: 12:00 tPA Given: No: Out of window Modified Rankin: Rankin Score=0  NIH stroke score of 0   SUBJECTIVE (INTERVAL HISTORY) No family is at the bedside.  He is at his neuro baseline. Speech difficulty resolved. He admitted that he did not compliant with HTN meds at home and now has newly diagnosed DM. MRI embolic pattern, needs embolic work up.    OBJECTIVE Temp:  [98 F (36.7 C)-98.8 F (37.1 C)] 98.6 F (37 C) (08/29 0747) Pulse Rate:  [78-112] 109 (08/29 0747) Cardiac Rhythm: Sinus tachycardia (08/28 1900) Resp:  [18-20] 20 (08/29 0747) BP:  (153-188)/(101-129) 159/108 (08/29 0747) SpO2:  [95 %-100 %] 100 % (08/29 0747) Weight:  [111.1 kg (245 lb)] 111.1 kg (245 lb) (08/28 2105)  CBC:  Recent Labs Lab 04/29/17 1005  05/02/17 0443 05/03/17 0352  WBC 18.4*  < > 13.3* 14.1*  NEUTROABS 15.7*  --   --   --   HGB 17.6*  < > 15.8 16.6  HCT 49.7  < > 48.1 49.1  MCV 84.2  < > 87.1 86.4  PLT 280  < > 269 263  < > = values in this interval not displayed.  Basic Metabolic Panel:  Recent Labs Lab 05/02/17 0432 05/03/17 0352  NA 136 134*  K 3.2* 3.3*  CL 98* 98*  CO2 28 24  GLUCOSE 240* 239*  BUN 9 11  CREATININE 0.87 0.90  CALCIUM 8.9 8.9    Lipid Panel:    Component Value Date/Time   CHOL 185 05/03/2017 0352   TRIG 178 (H) 05/03/2017 0352   HDL 34 (L) 05/03/2017 0352   CHOLHDL 5.4 05/03/2017 0352   VLDL 36 05/03/2017 0352   LDLCALC 115 (H) 05/03/2017 0352   HgbA1c:  Lab Results  Component Value Date   HGBA1C 11.9 (H) 04/29/2017   Urine Drug Screen:    Component Value Date/Time   LABOPIA NONE DETECTED 04/29/2017 1156   COCAINSCRNUR NONE DETECTED 04/29/2017 1156   LABBENZ NONE DETECTED 04/29/2017 1156   AMPHETMU NONE DETECTED 04/29/2017 1156  THCU NONE DETECTED 04/29/2017 1156   LABBARB NONE DETECTED 04/29/2017 1156    Alcohol Level No results found for: ETH  IMAGING I have personally reviewed the radiological images below and agree with the radiology interpretations.  Ct Angio Head and neck W Or Wo Contrast 05/03/2017 IMPRESSION: CT HEAD: 1. No acute intracranial process ; patient's known acute small infarcts not apparent by CT. 2. Mild chronic small vessel ischemic disease. 3. Borderline parenchymal brain volume loss for age. CTA NECK: 1. Mild atherosclerosis without hemodynamically significant stenosis or acute vascular process. CTA HEAD: 1. No emergent large vessel occlusion. 2. Severe stenosis RIGHT proximal P2 segment. Moderate stenosis RIGHT P1 segment. 3. Mild cerebral artery luminal irregularity  compatible with atherosclerosis.  Mr Brain Wo Contrast 05/02/2017 IMPRESSION: 1. 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 2 even smaller and likely subacute infarcts in the posterior right cerebrum. 2. Mild but premature white matter signal abnormality, patient's medical history suggesting chronic small vessel ischemia.   TTE pending  TEE pending  TCD bubble study pending  LE venous doppler pending    PHYSICAL EXAM  Temp:  [98 F (36.7 C)-98.8 F (37.1 C)] 98.6 F (37 C) (08/29 0747) Pulse Rate:  [78-112] 109 (08/29 0747) Resp:  [18-20] 20 (08/29 0747) BP: (153-182)/(101-129) 159/108 (08/29 0747) SpO2:  [95 %-100 %] 100 % (08/29 0747) Weight:  [245 lb (111.1 kg)] 245 lb (111.1 kg) (08/28 2105)  General - Well nourished, well developed, in no apparent distress.  Ophthalmologic - Sharp disc margins OU.   Cardiovascular - Regular rate and rhythm.  Mental Status -  Level of arousal and orientation to time, place, and person were intact. Language including expression, naming, repetition, comprehension was assessed and found intact. Attention span and concentration were normal. Fund of Knowledge was assessed and was intact.  Cranial Nerves II - XII - II - Visual field intact OU. III, IV, VI - Extraocular movements intact. V - Facial sensation intact bilaterally. VII - Facial movement intact bilaterally. VIII - Hearing & vestibular intact bilaterally. X - Palate elevates symmetrically. XI - Chin turning & shoulder shrug intact bilaterally. XII - Tongue protrusion intact.  Motor Strength - The patient's strength was normal in all extremities and pronator drift was absent.  Bulk was normal and fasciculations were absent.   Motor Tone - Muscle tone was assessed at the neck and appendages and was normal.  Reflexes - The patient's reflexes were 1+ in all extremities and he had no pathological reflexes.  Sensory - Light touch, temperature/pinprick, vibration  and proprioception, and Romberg testing were assessed and were symmetrical.    Coordination - The patient had normal movements in the hands and feet with no ataxia or dysmetria.  Tremor was absent.  Gait and Station - The patient's transfers, posture, gait, station, and turns were observed as normal.   ASSESSMENT/PLAN Thomas Burch is a 31 y.o. male with history of HTN presenting with speech difficulty. He did not receive IV t-PA due to out of window. Speech improved over time. Found to have newly diagnosed DM.    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 Need to rule out endocarditis and paradoxical emboli.  Resultant  Deficit resolved  CT head no acute abnormalities  MRI head  Subcentimeter acute infarct in the left occipital cortex. 2 even smaller and likely subacute infarcts in the posterior right cerebrum.  CTA head and neck - right P2  stenosis  LE venous doppler pending  2D Echo  pending  TCD bubble study pending  Recommend TEE to rule out endocarditis due to low grade fever and leukocytosis  LDL 115  HgbA1c 11.9  UDS neg  CSF negative for CNS infection  lovenox for VTE prophylaxis  Diet heart healthy/carb modified Room service appropriate? Yes; Fluid consistency: Thin  No antithrombotic prior to admission, now on aspirin 325 mg daily and clopidogrel 75 mg daily. DAPT for 3 months and then either ASA or plavix for intracranial stenosis and non-disabling mild stroke according to POINT trial  Patient counseled to be compliant with his antithrombotic medications  Ongoing aggressive stroke risk factor management  Therapy recommendations:  pending  Disposition:  pending  Hypertension  Unstable  On clonidien, HCTZ and lisinopril  BP goal normotensive  Hyperlipidemia  Home meds:  none  LDL 115, goal < 70  Add lipitor 80  Continue statin at discharge  Diabetes, new diagnosis, likely  type I  HgbA1c 11.5, goal < 7.0  Uncontrolled  DM education  SSI  On lantus  Close PCP follow up  Other Stroke Risk Factors  Obesity, Body mass index is 35.15 kg/m., recommend weight loss, diet and exercise as appropriate   Migraines  Other Active Problems  Leukocytosis - 18.4->15.9->14.1  Blood culture - staph with coagulase neg - likely contamination  Hospital day # 2  Marvel PlanJindong Edsel Shives, MD PhD Stroke Neurology 05/03/2017 12:17 PM    To contact Stroke Continuity provider, please refer to [email or web address]. After hours, contact General Neurology

## 2017-05-04 NOTE — CV Procedure (Signed)
    Transesophageal Echocardiogram Note  Thomas Burch [id number] 04/23/1986  Procedure: Transesophageal Echocardiogram Indications: CVA  Procedure Details Consent: Obtained Time Out: Verified patient identification, verified procedure, site/side was marked, verified correct patient position, special equipment/implants available, Radiology Safety Procedures followed,  medications/allergies/relevent history reviewed, required imaging and test results available.  Performed  Medications:  During this procedure the patient is administered a total of 500 mg Propofol IV  - administered by CRNA   Left Ventrical:  Low normal LV function  - EF 45-50%  Mitral Valve: normal MV, no vegetation.  Aortic Valve: normal valve, no vegetation  Tricuspid Valve: normal structure.  Trivial TR , no vegetation   Pulmonic Valve: normal   Left Atrium/ Left atrial appendage: no thrombi   Atrial septum: no evidence of PFO or ASD by color flow or bubble study    Aorta: normal aorta    Complications: No apparent complications Patient did tolerate procedure well.   Thomas MixerPhilip J. Shantrell Burch, Thomas HagemanJr., MD, [facility] HospitalFACC 05/04/2017, 8:40 AM

## 2017-05-04 NOTE — Progress Notes (Signed)
*  PRELIMINARY RESULTS* Echocardiogram Echocardiogram Transesophageal has been performed.  Lionell Matuszak T Thierry Dobosz 05/04/2017, 8:47 AM

## 2017-05-04 NOTE — Discharge Instructions (Signed)
You were admitted to the hospital with altered mental status which has resolved. After testing, we found that you have had a stroke. We have started you on new medications to prevent this from happening in the future. If you being to develop any similar symptoms, please do not hesitate to come into the hospital.   You were found to have high blood pressure, which could have contributed to your stroke. We have adjusted your medications to help better control your blood pressure. Please follow up with your primary care doctor to regularly check your blood pressure to ensure that you are on the right medications. Please try to limit your sodium intake.  You were also found to have new onset type II diabetes. Your Hgb A1c was 11.3. We have started you on insulin while in the hospital. You will be sent home with a Lantus pen and will need to take 13 Units every morning. We have also started you on a new oral medication for your diabetes called metformin. This will need to be taken twice a day. Please follow up with your primary care doctor for management of your diabetes, and to get you with a diabetes educator. Please try to monitor what you are eating and limit your carbs and sugars.  Neurology would like to monitor heart rate outpatient to ensure that there is no other cause for the stroke. They have recommended you follow up outpatient to receive a device that will monitor your heart for 30 days. Cardiology will follow up with you regarding this.  Again, if you experience any symptoms relating to a stroke, please do not hesitate to come back into the hospital.   It was a pleasure caring for you, and we wish you all the best in the future!

## 2017-05-04 NOTE — Transfer of Care (Signed)
Immediate Anesthesia Transfer of Care Note  Patient: BUNNIE LEDERMAN  Procedure(s) Performed: Procedure(s): TRANSESOPHAGEAL ECHOCARDIOGRAM (TEE) (N/A)  Patient Location: PACU  Anesthesia Type:MAC  Level of Consciousness: awake, alert , oriented and patient cooperative  Airway & Oxygen Therapy: Patient Spontanous Breathing and Patient connected to nasal cannula oxygen  Post-op Assessment: Report given to RN and Post -op Vital signs reviewed and stable  Post vital signs: Reviewed and stable  Last Vitals:  Vitals:   05/04/17 0752 05/04/17 0844  BP: (!) 165/104 127/68  Pulse:  100  Resp:  12  Temp:    SpO2:  93%    Last Pain:  Vitals:   05/04/17 0712  TempSrc: Oral  PainSc:          Complications: No apparent anesthesia complications

## 2017-05-04 NOTE — Progress Notes (Signed)
Family Medicine Teaching Service Daily Progress Note Intern Pager: [phone]  Patient name: Thomas Burch Medical record number: [id number] Date of birth: December 22, 1985 Age: 31 y.o. Gender: male  Primary Care Provider: Crue Otero, [place], DO Consultants: None Code Status: Full  Pt Overview and Major Events to Date:  8/25 admit for AMS, initiate broad spectrum abx coverage for ?meningitis. CT head negative  Assessment and Plan: Thomas Burch is a 31 y.o. male presenting with with AMS and low-grade fever. PMH is significant for poorly controlled hypertension obesity  Stroke: Stable. Likely due to hypertension and new onset diabetes. Neurology to follow - Vascular consulted for medical vs surgical treatment of severe stenosis found on CTA head - carotid doppler normal - Lipid panel showing LDL 115, TG 178, HDL 34, Tot Chol 185. - VAS [place]: no evidence of patent foramen ovale - ECHO completed- EF 50-55% with grade 2 diastolic dysfunction - Patient started on ASA, Lipitor 80mg  - Patient to have LOOP recorder placed today - Had TEE today- embolic pattern. Need to rule out endocarditis and paradoxical emboli.  HFpEF, new diagnosis: EF 50-55% with grade 2 diastolic dysfunction - Started ASA, statin - monitor  Diabetes, diagnosed this admission.  A1c 11.9, CBGs 198, 166 overnight. Required 12 units sliding scale, 13 U lantus.  - serial cbg checks - SSI - HH/Carb modified diet - seen by Diabetes coordinator  HTN: Uncontrolled. Hypertensive emergency on admit, 165/104 this AM. - Increase home clonidine 0.2 mg BID - Lisinopril 40mg  daily (8/28). Increase HCTZ  25 mg - Consider adding beta-blocker for tachycardia as well as BP control - hydralazine prn, 10 mg q4h, SBP >160/100  Hypokalemia- Improved, 3.5 8/30  - continue to monitor, K from 3.3 to 3.5 s/p 80 mEq of Kdur  ?hx autism - Doubt. Per report from patient's mother on admission, whom patient does not have a strong relationship with.  Reportedly patient is unaware of this diagnosis.  -We have had multiple lengthy discussions about his new diagnoses and management of his health. He is very eager for lifestyle changes and feels very fortunate to have caught the stroke. His behavior is normal. He has a normal affect, appropriate responses. Patient talks extensively about his relationship with his girlfriend and his work friends. Doubt there is any serious underlying autism.   FEN/GI: HH/Carb modified diet Prophylaxis: lovenox  Disposition: home when medically stable  Subjective:  Patient feeling well after TEE. No complaints. Ready to go home.    Objective: Temp:  [98 F (36.7 C)-98.6 F (37 C)] 98.5 F (36.9 C) (08/30 0936) Pulse Rate:  [96-102] 102 (08/30 0936) Resp:  [10-19] 16 (08/30 0936) BP: (122-192)/(68-139) 155/99 (08/30 0936) SpO2:  [93 %-99 %] 99 % (08/30 0936) Physical Exam: General: NAD, well-appearing Cardiovascular: RRR, no m/r/g Respiratory: CTA bil Abdomen: soft and nontender Extremities: non edematous Neuro: CN II-XII intact, AAOx3  Laboratory:  Recent Labs Lab 05/02/17 0443 05/03/17 0352 05/04/17 0359  WBC 13.3* 14.1* 15.3*  HGB 15.8 16.6 17.0  HCT 48.1 49.1 51.1  PLT 269 263 287    Recent Labs Lab 04/29/17 1005  05/02/17 0432 05/03/17 0352 05/04/17 0359  NA 133*  < > 136 134* 136  K 3.8  < > 3.2* 3.3* 3.5  CL 95*  < > 98* 98* 99*  CO2 24  < > 28 24 27   BUN 8  < > 9 11 15   CREATININE 1.03  < > 0.87 0.90 0.93  CALCIUM 9.1  < >  8.9 8.9 9.4  PROT 7.2  --   --   --   --   BILITOT 1.3*  --   --   --   --   ALKPHOS 98  --   --   --   --   ALT 65*  --   --   --   --   AST 39  --   --   --   --   GLUCOSE 374*  < > 240* 239* 166*  < > = values in this interval not displayed. TSH 0.493 A1c 11.9 C-peptide 3.4  TEE: Left Ventrical:  Low normal LV function  - EF 45-50% Mitral Valve: normal MV, no vegetation. Aortic Valve: normal valve, no vegetation Tricuspid Valve: normal  structure.  Trivial TR , no vegetation  Pulmonic Valve: normal  Left Atrium/ Left atrial appendage: no thrombi  Atrial septum: no evidence of PFO or ASD by color flow or bubble study  Aorta: normal aorta  ECHO: Study Conclusions  - Left ventricle: Wall thickness was increased in a pattern of   moderate LVH. There was focal basal hypertrophy. Systolic   function was normal. The estimated ejection fraction was in the   range of 50% to 55%. Features are consistent with a pseudonormal   left ventricular filling pattern, with concomitant abnormal   relaxation and increased filling pressure (grade 2 diastolic   dysfunction). - Aortic valve: Valve area (Vmax): 3.01 cm^2.  Ct Angio Head W Or Wo Contrast  Result Date: 05/03/2017 CLINICAL DATA:  Follow-up stroke.  History of hypertension. EXAM: CT ANGIOGRAPHY HEAD AND NECK TECHNIQUE: Multidetector CT imaging of the head and neck was performed using the standard protocol during bolus administration of intravenous contrast. Multiplanar CT image reconstructions and MIPs were obtained to evaluate the vascular anatomy. Carotid stenosis measurements (when applicable) are obtained utilizing NASCET criteria, using the distal internal carotid diameter as the denominator. CONTRAST:  50 cc Isovue 370 COMPARISON:  MRI of the head May 02, 2017 and CT HEAD April 29, 2017 FINDINGS: CT HEAD FINDINGS BRAIN: No intraparenchymal hemorrhage, mass effect nor midline shift. Patchy supratentorial white matter are type with densities. The ventricles and sulci are mildly prominent for age. No acute large vascular territory infarcts. No abnormal extra-axial fluid collections. Basal cisterns are patent. VASCULAR: Unremarkable. SKULL/SOFT TISSUES: No skull fracture. No significant soft tissue swelling. ORBITS/SINUSES: The included ocular globes and orbital contents are normal.The mastoid aircells and included paranasal sinuses are well-aerated. OTHER: None. CTA NECK AORTIC ARCH:  Normal appearance of the thoracic arch, 2 vessel arch is a normal variant. The origins of the innominate, left Common carotid artery and subclavian artery are widely patent. RIGHT CAROTID SYSTEM: Common carotid artery is widely patent, coursing in a straight line fashion. Mild eccentric calcific atherosclerosis of the carotid bifurcation without hemodynamically significant stenosis by NASCET criteria. Normal appearance of the included internal carotid artery. LEFT CAROTID SYSTEM: Common carotid artery is widely patent, coursing in a straight line fashion. Mild eccentric calcific atherosclerosis of the carotid bifurcation without hemodynamically significant stenosis by NASCET criteria. Normal appearance of the included internal carotid artery. VERTEBRAL ARTERIES:The codominant vertebral artery's. Normal appearance of the vertebral arteries, which appear widely patent. SKELETON: No acute osseous process though bone windows have not been submitted. OTHER NECK: Soft tissues of the neck are nonacute though, not tailored for evaluation. UPPER CHEST: Included lung apices are clear. No superior mediastinal lymphadenopathy. CTA HEAD ANTERIOR CIRCULATION: Patent cervical internal carotid arteries, petrous,  cavernous and supra clinoid internal carotid arteries. Widely patent anterior communicating artery. Patent anterior and middle cerebral arteries. Mild luminal irregularity bilateral mid to distal middle cerebral artery's. No large vessel occlusion, significant stenosis, contrast extravasation or aneurysm. POSTERIOR CIRCULATION: Patent vertebral arteries, vertebrobasilar junction and basilar artery, as well as main branch vessels. Patent posterior cerebral arteries. Severe stenosis RIGHT proximal P2 segment, moderate stenosis RIGHT mid P1 segment. Mild luminal irregularity LEFT posterior cerebral artery. No large vessel occlusion, contrast extravasation or aneurysm. VENOUS SINUSES: Major dural venous sinuses are patent  though not tailored for evaluation on this angiographic examination. ANATOMIC VARIANTS: None. DELAYED PHASE: No abnormal intracranial enhanced. MIP images reviewed. IMPRESSION: CT HEAD: 1. No acute intracranial process ; patient's known acute small infarcts not apparent by CT. 2. Mild chronic small vessel ischemic disease. 3. Borderline parenchymal brain volume loss for age. CTA NECK: 1. Mild atherosclerosis without hemodynamically significant stenosis or acute vascular process. CTA HEAD: 1. No emergent large vessel occlusion. 2. Severe stenosis RIGHT proximal P2 segment. Moderate stenosis RIGHT P1 segment. 3. Mild cerebral artery luminal irregularity compatible with atherosclerosis. Electronically Signed   By: Awilda Metro M.D.   On: 05/03/2017 02:04   Ct Angio Neck W Or Wo Contrast  Result Date: 05/03/2017 CLINICAL DATA:  Follow-up stroke.  History of hypertension. EXAM: CT ANGIOGRAPHY HEAD AND NECK TECHNIQUE: Multidetector CT imaging of the head and neck was performed using the standard protocol during bolus administration of intravenous contrast. Multiplanar CT image reconstructions and MIPs were obtained to evaluate the vascular anatomy. Carotid stenosis measurements (when applicable) are obtained utilizing NASCET criteria, using the distal internal carotid diameter as the denominator. CONTRAST:  50 cc Isovue 370 COMPARISON:  MRI of the head May 02, 2017 and CT HEAD April 29, 2017 FINDINGS: CT HEAD FINDINGS BRAIN: No intraparenchymal hemorrhage, mass effect nor midline shift. Patchy supratentorial white matter are type with densities. The ventricles and sulci are mildly prominent for age. No acute large vascular territory infarcts. No abnormal extra-axial fluid collections. Basal cisterns are patent. VASCULAR: Unremarkable. SKULL/SOFT TISSUES: No skull fracture. No significant soft tissue swelling. ORBITS/SINUSES: The included ocular globes and orbital contents are normal.The mastoid aircells and  included paranasal sinuses are well-aerated. OTHER: None. CTA NECK AORTIC ARCH: Normal appearance of the thoracic arch, 2 vessel arch is a normal variant. The origins of the innominate, left Common carotid artery and subclavian artery are widely patent. RIGHT CAROTID SYSTEM: Common carotid artery is widely patent, coursing in a straight line fashion. Mild eccentric calcific atherosclerosis of the carotid bifurcation without hemodynamically significant stenosis by NASCET criteria. Normal appearance of the included internal carotid artery. LEFT CAROTID SYSTEM: Common carotid artery is widely patent, coursing in a straight line fashion. Mild eccentric calcific atherosclerosis of the carotid bifurcation without hemodynamically significant stenosis by NASCET criteria. Normal appearance of the included internal carotid artery. VERTEBRAL ARTERIES:The codominant vertebral artery's. Normal appearance of the vertebral arteries, which appear widely patent. SKELETON: No acute osseous process though bone windows have not been submitted. OTHER NECK: Soft tissues of the neck are nonacute though, not tailored for evaluation. UPPER CHEST: Included lung apices are clear. No superior mediastinal lymphadenopathy. CTA HEAD ANTERIOR CIRCULATION: Patent cervical internal carotid arteries, petrous, cavernous and supra clinoid internal carotid arteries. Widely patent anterior communicating artery. Patent anterior and middle cerebral arteries. Mild luminal irregularity bilateral mid to distal middle cerebral artery's. No large vessel occlusion, significant stenosis, contrast extravasation or aneurysm. POSTERIOR CIRCULATION: Patent vertebral arteries, vertebrobasilar junction and  basilar artery, as well as main branch vessels. Patent posterior cerebral arteries. Severe stenosis RIGHT proximal P2 segment, moderate stenosis RIGHT mid P1 segment. Mild luminal irregularity LEFT posterior cerebral artery. No large vessel occlusion, contrast  extravasation or aneurysm. VENOUS SINUSES: Major dural venous sinuses are patent though not tailored for evaluation on this angiographic examination. ANATOMIC VARIANTS: None. DELAYED PHASE: No abnormal intracranial enhanced. MIP images reviewed. IMPRESSION: CT HEAD: 1. No acute intracranial process ; patient's known acute small infarcts not apparent by CT. 2. Mild chronic small vessel ischemic disease. 3. Borderline parenchymal brain volume loss for age. CTA NECK: 1. Mild atherosclerosis without hemodynamically significant stenosis or acute vascular process. CTA HEAD: 1. No emergent large vessel occlusion. 2. Severe stenosis RIGHT proximal P2 segment. Moderate stenosis RIGHT P1 segment. 3. Mild cerebral artery luminal irregularity compatible with atherosclerosis. Electronically Signed   By: Awilda Metroourtnay  Bloomer M.D.   On: 05/03/2017 02:04   Mr Brain Wo Contrast  Result Date: 05/02/2017 CLINICAL DATA:  Altered mental status. EXAM: MRI HEAD WITHOUT CONTRAST TECHNIQUE: Multiplanar, multiecho pulse sequences of the brain and surrounding structures were obtained without intravenous contrast. COMPARISON:  Head CT 3 days ago FINDINGS: Brain: 6 mm acute cortical infarct in the parasagittal left occipital cortex. Abnormal but less intense diffusion hyperintensity in the deep right cerebral white matter (posterior corona radiata), and cortex of the right occipital temporal lobe - these are in the right MCA/ posterior border zone distribution. There is patchy FLAIR hyperintensity in the cerebral white matter, mild but abnormal for age. Patient's history of diabetes and hypertension and this is likely premature chronic microvascular ischemia. No typical multiple sclerosis type pattern. No hemorrhage, hydrocephalus, or masslike findings. Vascular: Major flow voids are preserved Skull and upper cervical spine: Negative for marrow lesion Sinuses/Orbits: Negative IMPRESSION: 1. Subcentimeter acute infarct in the left occipital  cortex. 2 even smaller and likely subacute infarcts in the posterior right cerebrum. 2. Mild but premature white matter signal abnormality, patient's medical history suggesting chronic small vessel ischemia. Electronically Signed   By: Marnee SpringJonathon  Watts M.D.   On: 05/02/2017 14:01   Imaging/Diagnostic Tests: Dg Chest 2 View 04/29/2017 . FINDINGS: The heart size and mediastinal contours are within normal limits. Both lungs are clear. The visualized skeletal structures are unremarkable.  IMPRESSION: No active cardiopulmonary disease.   Ct Head Wo Contrast 04/29/2017 FINDINGS: Brain: No evidence of acute infarction, hemorrhage, hydrocephalus, extra-axial collection or mass lesion/mass effect. Vascular: No hyperdense vessel or unexpected calcification. Skull: Normal. Negative for fracture or focal lesion. Sinuses/Orbits: No acute finding. Other: None.  IMPRESSION:  1. Negative for bleed or other acute intracranial process.    Taym Twist, [place], DO 05/04/2017, 3:19 PM PGY-1, [facility] FPTS Intern pager: [phone], text pages welcome

## 2017-05-04 NOTE — Progress Notes (Signed)
Discharge instructions, medications/prescriptions and follow up appointments discussed and reviewed with pt and family, verbalized understanding. Telemetry dc'ed, CCMD notified. IV dc'ed, site clean and dry. Pt was escorted out of the unit in wheelchair, took all belongings with him.

## 2017-05-05 ENCOUNTER — Inpatient Hospital Stay: Payer: Commercial Managed Care - PPO | Admitting: Family Medicine

## 2017-05-05 ENCOUNTER — Encounter (HOSPITAL_COMMUNITY): Payer: Self-pay | Admitting: Cardiovascular Disease

## 2017-05-05 LAB — LUPUS ANTICOAGULANT PANEL
DRVVT: 38.7 s (ref 0.0–47.0)
PTT LA: 32.9 s (ref 0.0–51.9)

## 2017-05-05 LAB — PROTEIN C ACTIVITY: PROTEIN C ACTIVITY: 133 % (ref 73–180)

## 2017-05-05 LAB — ANTINUCLEAR ANTIBODIES, IFA: ANA Ab, IFA: NEGATIVE

## 2017-05-05 LAB — PROTEIN S, TOTAL: PROTEIN S AG TOTAL: 95 % (ref 60–150)

## 2017-05-05 LAB — PROTEIN S ACTIVITY: Protein S Activity: 79 % (ref 63–140)

## 2017-05-05 LAB — HOMOCYSTEINE: Homocysteine: 14.1 umol/L (ref 0.0–15.0)

## 2017-05-05 LAB — ANTI-DNA ANTIBODY, DOUBLE-STRANDED: ds DNA Ab: <1 IU/mL (ref 0–9)

## 2017-05-06 LAB — BETA-2-GLYCOPROTEIN I ABS, IGG/M/A: Beta-2-Glycoprotein I IgA: <9 GPI IgA units (ref 0–25)

## 2017-05-07 LAB — CARDIOLIPIN ANTIBODIES, IGG, IGM, IGA
Anticardiolipin IgA: <9 APL U/mL (ref 0–11)
Anticardiolipin IgG: <9 GPL U/mL (ref 0–14)

## 2017-05-07 LAB — PROTEIN C, TOTAL: Protein C, Total: 90 % (ref 60–150)

## 2017-05-08 ENCOUNTER — Emergency Department (HOSPITAL_COMMUNITY)
Admission: EM | Admit: 2017-05-08 | Discharge: 2017-05-09 | Disposition: A | Discharge status: Home / Self Care | Payer: Commercial Managed Care - PPO | Source: Home / Self Care | Attending: Emergency Medicine | Admitting: Emergency Medicine

## 2017-05-08 ENCOUNTER — Encounter (HOSPITAL_COMMUNITY): Payer: Self-pay

## 2017-05-08 ENCOUNTER — Other Ambulatory Visit: Payer: Self-pay

## 2017-05-08 DIAGNOSIS — E119 Type 2 diabetes mellitus without complications: Secondary | ICD-10-CM | POA: Insufficient documentation

## 2017-05-08 DIAGNOSIS — I10 Essential (primary) hypertension: Secondary | ICD-10-CM | POA: Insufficient documentation

## 2017-05-08 DIAGNOSIS — Z8673 Personal history of transient ischemic attack (TIA), and cerebral infarction without residual deficits: Secondary | ICD-10-CM | POA: Insufficient documentation

## 2017-05-08 DIAGNOSIS — Z7902 Long term (current) use of antithrombotics/antiplatelets: Secondary | ICD-10-CM | POA: Insufficient documentation

## 2017-05-08 DIAGNOSIS — N179 Acute kidney failure, unspecified: Secondary | ICD-10-CM | POA: Insufficient documentation

## 2017-05-08 DIAGNOSIS — I6340 Cerebral infarction due to embolism of unspecified cerebral artery: Secondary | ICD-10-CM | POA: Diagnosis not present

## 2017-05-08 DIAGNOSIS — Z79899 Other long term (current) drug therapy: Secondary | ICD-10-CM | POA: Insufficient documentation

## 2017-05-08 DIAGNOSIS — Z7982 Long term (current) use of aspirin: Secondary | ICD-10-CM

## 2017-05-08 DIAGNOSIS — I639 Cerebral infarction, unspecified: Secondary | ICD-10-CM | POA: Diagnosis not present

## 2017-05-08 LAB — COMPREHENSIVE METABOLIC PANEL
ALBUMIN: 3.9 g/dL (ref 3.5–5.0)
ALT: 77 U/L — ABNORMAL HIGH (ref 17–63)
AST: 40 U/L (ref 15–41)
Alkaline Phosphatase: 74 U/L (ref 38–126)
Anion gap: 10 (ref 5–15)
BUN: 39 mg/dL — AB (ref 6–20)
CALCIUM: 9.5 mg/dL (ref 8.9–10.3)
CO2: 23 mmol/L (ref 22–32)
CREATININE: 2.12 mg/dL — AB (ref 0.61–1.24)
Chloride: 102 mmol/L (ref 101–111)
GFR calc Af Amer: 46 mL/min — ABNORMAL LOW (ref >60)
GFR calc non Af Amer: 39 mL/min — ABNORMAL LOW (ref >60)
GLUCOSE: 205 mg/dL — AB (ref 65–99)
Potassium: 4.4 mmol/L (ref 3.5–5.1)
SODIUM: 135 mmol/L (ref 135–145)
TOTAL PROTEIN: 6.9 g/dL (ref 6.5–8.1)
Total Bilirubin: 0.7 mg/dL (ref 0.3–1.2)

## 2017-05-08 LAB — CBC
HEMATOCRIT: 48.2 % (ref 39.0–52.0)
Hemoglobin: 16.2 g/dL (ref 13.0–17.0)
MCH: 29.7 pg (ref 26.0–34.0)
MCHC: 33.6 g/dL (ref 30.0–36.0)
MCV: 88.3 fL (ref 78.0–100.0)
PLATELETS: 225 10*3/uL (ref 150–400)
RBC: 5.46 MIL/uL (ref 4.22–5.81)
RDW: 13.6 % (ref 11.5–15.5)
WBC: 13 10*3/uL — AB (ref 4.0–10.5)

## 2017-05-08 LAB — TROPONIN I

## 2017-05-08 MED ORDER — LACTATED RINGERS IV BOLUS (SEPSIS)
1000.0000 mL | Freq: Once | INTRAVENOUS | Status: AC
  Administered 2017-05-08: 1000 mL via INTRAVENOUS

## 2017-05-08 NOTE — ED Notes (Signed)
ED Provider at bedside. 

## 2017-05-08 NOTE — ED Provider Notes (Signed)
I saw and evaluated the patient, reviewed the resident's note and I agree with the findings and plan.  Pertinent History: the patient is a 31 year old male, has recently been admitted to the hospital with a very complicated medical picture including a possible embolic stroke, had a negative transesophageal echocardiogram, was discharged from the hospital on Plavix and aspirin, he states that he has not been taking his Plavix.  It was reported by his family members that he had some slurred speech after waking up from a nap but he states that resolved quickly and he is no longer having any symptoms whatsoever   Pertinent Exam findings: on exam the patient's speech is clear, coordination is totally normal, strength is normal in all 4 extremities, mental status is normal, heart is clear, lungs are clear, no arrhythmias.  The patient has resolved all symptoms, he has no active symptoms, neurology was consult and recommended against aggressive neurologic evaluation. The patient was counseled on his use of his antiplatelet agents. He is agreeable to take them regularly as prescribed  Found to have AKI c/w last admission.   EKG Interpretation  Date/Time:  Monday May 08 2017 19:34:54 EDT Ventricular Rate:  83 PR Interval:    QRS Duration: 96 QT Interval:  383 QTC Calculation: 450 R Axis:   6 Text Interpretation:  Sinus rhythm Inferior infarct, age indeterminate Lateral leads are also involved Since last tracing  T wave inversions are now seen. Confirmed by Eber HongMiller, Percell Lamboy ([id number]) on 05/08/2017 7:47:06 PM Also confirmed by Eber HongMiller, Rashad Auld ([id number]), editor Elita QuickWatlington, [place] [phone]  on 05/09/2017 8:14:49 AM        Final diagnoses:  AKI (acute kidney injury) (HCC)      Eber HongMiller, Montasia Chisenhall, MD 05/09/17 1615

## 2017-05-08 NOTE — Progress Notes (Addendum)
FPTS Interim Progress Note  S:Patient reports that he came in when his friends said he became unresponsive after his insulin. Reports he is feeling much better ow and would like to go home. He says he is not taking his Plavix because he didn't believe he needed it, but he will start taking it.   O: BP 113/79   Pulse 98   Temp 98.5 F (36.9 C) (Oral)   Resp 20   Ht 5\' 10"  (1.778 m)   Wt 245 lb (111.1 kg)   SpO2 96%   BMI 35.15 kg/m    Glucose 205 Cr 2.12 K 4.4  Physical Exam:  General: NAD, pleasant Neck: Supple Respiratory: normal work of breathing MSK: moves 4 extremities equally Derm: no rashes appreciated Psych: alert and oriented, appropriate affect  A/P: ED attending spoke with neurology who recommends no follow up studies given that he is not currently altered and had full work up last week.   AKI: with Cr of 0.93 on 05/04/2017 to 2.12 on 05/08/2017. Likely due to dehydration, as patient reports he has been drinking less. Will give 2 L of IVF and send home. Patient has hospital follow up appointment for 05/10/2017 at 10:50 am with Dr. Jennette KettleNeal at the family medicine clinic. Will repeat BMP at this appointment to monitor AKI. Patient encouraged to drink plenty of fluids and take all his medications.   Patient informed that if his symptoms worsen or he develops new onset AMS that he is to return to the ED.   Cassadi Purdie, [place], DO 05/08/2017, 11:52 PM PGY-1, [facility] pager [phone]

## 2017-05-08 NOTE — ED Provider Notes (Signed)
[facility]-EM[facility]ENCY DEPT Provider Note   CSN: [id number] Arrival date & time: 05/08/17  [id number]     History   Chief Complaint Chief Complaint  Patient presents with  . Aphasia  . Altered Mental Status    HPI Thomas Burch is a 31 y.o. male.  This is a 31 year old male who presents with transient episode of slurred speech according to himself and his girlfriend earlier this afternoon approximately 1 hour prior to arrival to the ED.  He arrives via EMS with blood sugar 196.  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 The patient currently denies any symptoms.  He denies any chest pain, dyspnea, blurry vision, facial tingling or asymmetry, tinnitus, numbness or tingling in his extremities.  He feels well and he states the main reason he came was at the request of his girlfriend and family members who were concerned for his well-being given recent admission. The patient apparently had taken a nap and upon waking called his girlfriend who felt that he was having slurred speech.  Patient was completely aware of the event, he was not altered or confused at the time.  EMS was called who evaluated the patient at home and patient elected to be taken to the ED for further evaluation.  Patient states he has not missed any hypertensive medication doses however he states he has not taken his Plavix, when asked why he stated he does not want to.    The history is provided by the patient and medical records.    Past Medical History:  Diagnosis Date  . Headache   . Hypertension   . Stroke [facility](HCC)     Patient Active Problem List   Diagnosis Date Noted  . Diabetes mellitus type I (HCC)   . Hyperlipidemia   . CVA (cerebral vascular accident) (HCC) 05/02/2017  . Encephalitis   . Hypertensive urgency   . Lactic acidosis   . Altered  mental status, unspecified 04/29/2017  . Elevated temperature   . Cough   . Hyperglycemia   . Hypertension 02/01/2014  . Obesity, Class II, BMI 35-39.9, with comorbidity 02/01/2014    Past Surgical History:  Procedure Laterality Date  . TEE WITHOUT CARDIOVERSION N/A 05/04/2017   Procedure: TRANSESOPHAGEAL ECHOCARDIOGRAM (TEE);  Surgeon: Elease HashimotoNahser, Deloris PingPhilip J, MD;  [facility];  Service: Cardiovascular;  Laterality: N/A;       Home Medications    Prior to Admission medications   Medication Sig Start Date End Date Taking? Authorizing Provider  cloNIDine (CATAPRES) 0.2 MG tablet Take 1 tablet (0.2 mg total) by mouth 2 (two) times daily. 05/04/17  Yes Talbert ForestShirley, [place], DO  hydrochlorothiazide (HYDRODIURIL) 25 MG tablet Take 1 tablet (25 mg total) by mouth daily. 05/05/17  Yes Talbert ForestShirley, [place], DO  Insulin Glargine (LANTUS) 100 UNIT/ML Solostar Pen Inject 13 Units into the skin every morning. 05/04/17  Yes Talbert ForestShirley, [place], DO  lisinopril (PRINIVIL,ZESTRIL) 40 MG tablet Take 1 tablet (40 mg total) by mouth daily. 05/05/17  Yes Shirley, [place], DO  aspirin EC 325 MG EC tablet Take 1 tablet (325 mg total) by mouth daily. Patient not taking: Reported on 05/08/2017 05/05/17   Shirley, [place], DO  atorvastatin (LIPITOR) 80 MG tablet Take 1 tablet (80 mg total) by mouth daily at 6 PM. Patient not taking: Reported on 05/08/2017 05/04/17   Shirley, [place], DO  clopidogrel (  PLAVIX) 75 MG tablet Take 1 tablet (75 mg total) by mouth daily. Patient not taking: Reported on 05/08/2017 05/05/17   Shirley, [place], DO    Family History Family History  Problem Relation Age of Onset  . Hypertension Father   . Stroke Father   . Hyperlipidemia Father   . Diabetes Maternal Grandmother     Social History Social History  Substance Use Topics  . Smoking status: Never Smoker  . Smokeless tobacco: Never Used  . Alcohol use No     Comment: 1 drink per week     Allergies   Patient has no known  allergies.   Review of Systems Review of Systems  Constitutional: Negative for chills and fever.  HENT: Negative for ear pain and sore throat.   Eyes: Negative for pain and visual disturbance.  Respiratory: Negative for cough and shortness of breath.   Cardiovascular: Negative for chest pain and palpitations.  Gastrointestinal: Negative for abdominal pain and vomiting.  Genitourinary: Negative for dysuria and hematuria.  Musculoskeletal: Negative for arthralgias and back pain.  Skin: Negative for color change and rash.  Neurological: Negative for dizziness, tremors, seizures, syncope, facial asymmetry, speech difficulty, weakness, light-headedness, numbness and headaches.  All other systems reviewed and are negative.    Physical Exam Updated Vital Signs BP 117/78   Pulse 97   Temp 98.5 F (36.9 C) (Oral)   Resp 19   Ht 5\' 10"  (1.778 m)   Wt 111.1 kg (245 lb)   SpO2 97%   BMI 35.15 kg/m   Physical Exam  Constitutional: He is oriented to person, place, and time. He appears well-developed and well-nourished. No distress.  HENT:  Head: Normocephalic and atraumatic.  Eyes: Pupils are equal, round, and reactive to light. Conjunctivae and EOM are normal.  Neck: Normal range of motion. Neck supple.  Cardiovascular: Normal rate, regular rhythm, normal heart sounds and intact distal pulses.   No murmur heard. Pulmonary/Chest: Effort normal and breath sounds normal. No respiratory distress.  Abdominal: Soft. There is no tenderness.  Musculoskeletal: Normal range of motion. He exhibits no edema.  Neurological: He is alert and oriented to person, place, and time. He has normal strength. He displays no tremor. No cranial nerve deficit or sensory deficit. He exhibits normal muscle tone. Coordination and gait normal.  Skin: Skin is warm and dry. Capillary refill takes less than 2 seconds. No rash noted. He is not diaphoretic. No erythema.  Psychiatric: He has a normal mood and affect.   Nursing note and vitals reviewed.  ED Treatments / Results  Labs (all labs ordered are listed, but only abnormal results are displayed) Labs Reviewed  CBC - Abnormal; Notable for the following:       Result Value   WBC 13.0 (*)    All other components within normal limits  COMPREHENSIVE METABOLIC PANEL - Abnormal; Notable for the following:    Glucose, Bld 205 (*)    BUN 39 (*)    Creatinine, Ser 2.12 (*)    ALT 77 (*)    GFR calc non Af Amer 39 (*)    GFR calc Af Amer 46 (*)    All other components within normal limits  TROPONIN I  CBG MONITORING, ED    EKG  EKG Interpretation  Date/Time:  Monday May 08 2017 19:34:54 EDT Ventricular Rate:  83 PR Interval:    QRS Duration: 96 QT Interval:  383 QTC Calculation: 450 R Axis:   6 Text Interpretation:  Sinus rhythm Inferior infarct, age indeterminate Lateral leads are also involved Since last tracing  T wave inversions are now seen. Confirmed by Eber Hong ([id number]) on 05/08/2017 7:47:06 PM      Radiology No results found.  Procedures Procedures (including critical care time)  Medications Ordered in ED Medications  lactated ringers bolus 1,000 mL (not administered)    Initial Impression / Assessment and Plan / ED Course  I have reviewed the triage vital signs and the nursing notes.  Pertinent labs & imaging results that were available during my care of the patient were reviewed by me and considered in my medical decision making (see chart for details).     This is a 31 year old male who presents with transient episode of slurred speech according to himself and his girlfriend earlier this afternoon approximately 1 hour prior to arrival to the ED.  He arrives via EMS with blood sugar 196.  On assessment as noted above, patient had completely benign and unremarkable neurological exam which is nonfocal.  He had no slurred speech and was fully alert and oriented 4.  BP in triage within normal range. Troponin  unremarkable, CMP shows stable creatinine function.  Unlikely hypertensive urgency at this time.  Neurology called who agreed with current management and stated that there would be no further benefit from advanced imaging or further diagnostic evaluation given patient was recently discharged 4 days ago after a full neurological workup including TEE, MRA, Carotid Duplex, and multiple laboratory studies for stroke risk assessment.  Creatinine 2.1, major jump from 0.92 4 days prior. Family medicine consulted due to AKI.  Lactated ringers bolus x2 given. Agreed that patient has established follow up in <24 hours and he would be best served being hydrated in the ED and evaluated fully tomorrow.  All questions answered prior to discharge. Strict return precautions given.  Final Clinical Impressions(s) / ED Diagnoses   Final diagnoses:  AKI (acute kidney injury) [facility])    New Prescriptions New Prescriptions   No medications on file     Shaune Pollack, MD 05/08/17 [id number]    Eber Hong, MD 05/09/17 [phone]

## 2017-05-08 NOTE — ED Triage Notes (Signed)
Pt from home via EMS for intermittent slurred speech and confusion that has since resolved. LSN 10 AM. Pt seen recently for similar presentation and diagnosed with multiple TIA. Pt given rx for blood thinners but reports he has not taken them "because his mom told him not to." Pt with hx of autism with baseline mentation at this time per family. No neuro deficits. VAN negative. EMS VS: 120/88, 20 RR, 93% on RA, 94 NSR, CBG 196. 18 G L AC. Dr. Hyacinth MeekerMiller aware of pt symptoms. Pt A&Ox4. Ambulatory to bed from stretcher.

## 2017-05-09 ENCOUNTER — Encounter: Payer: Self-pay | Admitting: Family Medicine

## 2017-05-09 LAB — PROTHROMBIN GENE MUTATION

## 2017-05-09 MED ORDER — LACTATED RINGERS IV BOLUS (SEPSIS)
1000.0000 mL | Freq: Once | INTRAVENOUS | Status: AC
  Administered 2017-05-09: 1000 mL via INTRAVENOUS

## 2017-05-09 NOTE — Progress Notes (Signed)
Attempted to call patient to ask him to stop taking his Lisinopril in setting of AKI. Patient did not answer. Will follow up in clinic tomorrow and will decide based on his repeat Creatinine after fluids if it needs to be adjusted.

## 2017-05-10 ENCOUNTER — Encounter: Payer: Self-pay | Admitting: Family Medicine

## 2017-05-10 ENCOUNTER — Ambulatory Visit (INDEPENDENT_AMBULATORY_CARE_PROVIDER_SITE_OTHER): Payer: Commercial Managed Care - PPO | Admitting: Family Medicine

## 2017-05-10 VITALS — BP 108/82 | HR 86 | Temp 98.0°F | Ht 70.0 in | Wt 239.4 lb

## 2017-05-10 DIAGNOSIS — Z8679 Personal history of other diseases of the circulatory system: Secondary | ICD-10-CM | POA: Diagnosis not present

## 2017-05-10 DIAGNOSIS — N179 Acute kidney failure, unspecified: Secondary | ICD-10-CM | POA: Diagnosis not present

## 2017-05-10 DIAGNOSIS — E1059 Type 1 diabetes mellitus with other circulatory complications: Secondary | ICD-10-CM | POA: Diagnosis not present

## 2017-05-10 DIAGNOSIS — I160 Hypertensive urgency: Secondary | ICD-10-CM

## 2017-05-10 NOTE — Patient Instructions (Addendum)
Please continue to take all of her medicines as we discussed. It is very important for you to take the Plavix especially. 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 All 3 medicines are meant to reduce that risk that Plavix is one of the most important ones.  Regarding your episodic confusion, I am concerned that it may be related to your blood sugars. See if the episodes correspond with eating. Specifically see if you're confusion is most common in the first 1-2 hours after eating a big meal. That would make me think it was related to your diabetes.  If you have an episode of confusion that lasts longer than a few minutes, you probably should go back to the emergency department as you are at high risk for stroke right now. We're trying to reduce that risk by giving you medications to impact your risk factors.  I am making an appointment for you to see your new PCP, Dr. Talbert ForestShirley Please keep that appointment. Please call us in the interim with any new or worsening symptoms.  I am rechecking your kidney function today as it was elevated when you were in the emergency department. I will send you a note about that,

## 2017-05-11 ENCOUNTER — Telehealth: Payer: Self-pay | Admitting: Neurology

## 2017-05-11 ENCOUNTER — Encounter (HOSPITAL_COMMUNITY): Payer: Self-pay | Admitting: *Deleted

## 2017-05-11 ENCOUNTER — Other Ambulatory Visit: Payer: Self-pay

## 2017-05-11 ENCOUNTER — Inpatient Hospital Stay (HOSPITAL_COMMUNITY)
Admission: EM | Admit: 2017-05-11 | Discharge: 2017-05-13 | DRG: 064 | Disposition: A | Discharge status: Home / Self Care | Payer: Commercial Managed Care - PPO | Attending: Family Medicine | Admitting: Family Medicine

## 2017-05-11 ENCOUNTER — Emergency Department (HOSPITAL_COMMUNITY): Payer: Commercial Managed Care - PPO

## 2017-05-11 DIAGNOSIS — I5032 Chronic diastolic (congestive) heart failure: Secondary | ICD-10-CM | POA: Diagnosis present

## 2017-05-11 DIAGNOSIS — Z8673 Personal history of transient ischemic attack (TIA), and cerebral infarction without residual deficits: Secondary | ICD-10-CM | POA: Diagnosis not present

## 2017-05-11 DIAGNOSIS — Z8661 Personal history of infections of the central nervous system: Secondary | ICD-10-CM | POA: Diagnosis not present

## 2017-05-11 DIAGNOSIS — R41841 Cognitive communication deficit: Secondary | ICD-10-CM | POA: Diagnosis present

## 2017-05-11 DIAGNOSIS — Z833 Family history of diabetes mellitus: Secondary | ICD-10-CM | POA: Diagnosis not present

## 2017-05-11 DIAGNOSIS — Z7982 Long term (current) use of aspirin: Secondary | ICD-10-CM

## 2017-05-11 DIAGNOSIS — E1010 Type 1 diabetes mellitus with ketoacidosis without coma: Secondary | ICD-10-CM | POA: Diagnosis present

## 2017-05-11 DIAGNOSIS — I10 Essential (primary) hypertension: Secondary | ICD-10-CM | POA: Diagnosis present

## 2017-05-11 DIAGNOSIS — I6330 Cerebral infarction due to thrombosis of unspecified cerebral artery: Secondary | ICD-10-CM | POA: Insufficient documentation

## 2017-05-11 DIAGNOSIS — Z8349 Family history of other endocrine, nutritional and metabolic diseases: Secondary | ICD-10-CM | POA: Diagnosis not present

## 2017-05-11 DIAGNOSIS — E785 Hyperlipidemia, unspecified: Secondary | ICD-10-CM | POA: Diagnosis present

## 2017-05-11 DIAGNOSIS — Z79899 Other long term (current) drug therapy: Secondary | ICD-10-CM | POA: Diagnosis not present

## 2017-05-11 DIAGNOSIS — Z7902 Long term (current) use of antithrombotics/antiplatelets: Secondary | ICD-10-CM

## 2017-05-11 DIAGNOSIS — Z823 Family history of stroke: Secondary | ICD-10-CM

## 2017-05-11 DIAGNOSIS — R1310 Dysphagia, unspecified: Secondary | ICD-10-CM | POA: Diagnosis present

## 2017-05-11 DIAGNOSIS — R4702 Dysphasia: Secondary | ICD-10-CM | POA: Diagnosis present

## 2017-05-11 DIAGNOSIS — Z794 Long term (current) use of insulin: Secondary | ICD-10-CM

## 2017-05-11 DIAGNOSIS — Z8249 Family history of ischemic heart disease and other diseases of the circulatory system: Secondary | ICD-10-CM | POA: Diagnosis not present

## 2017-05-11 DIAGNOSIS — I110 Hypertensive heart disease with heart failure: Secondary | ICD-10-CM | POA: Diagnosis present

## 2017-05-11 DIAGNOSIS — I639 Cerebral infarction, unspecified: Secondary | ICD-10-CM | POA: Diagnosis present

## 2017-05-11 DIAGNOSIS — N179 Acute kidney failure, unspecified: Secondary | ICD-10-CM | POA: Diagnosis present

## 2017-05-11 DIAGNOSIS — R4701 Aphasia: Secondary | ICD-10-CM | POA: Diagnosis present

## 2017-05-11 DIAGNOSIS — I160 Hypertensive urgency: Secondary | ICD-10-CM | POA: Diagnosis present

## 2017-05-11 DIAGNOSIS — E669 Obesity, unspecified: Secondary | ICD-10-CM | POA: Diagnosis present

## 2017-05-11 DIAGNOSIS — Z6833 Body mass index (BMI) 33.0-33.9, adult: Secondary | ICD-10-CM | POA: Diagnosis not present

## 2017-05-11 DIAGNOSIS — R29702 NIHSS score 2: Secondary | ICD-10-CM | POA: Diagnosis not present

## 2017-05-11 DIAGNOSIS — E1165 Type 2 diabetes mellitus with hyperglycemia: Secondary | ICD-10-CM

## 2017-05-11 DIAGNOSIS — I6340 Cerebral infarction due to embolism of unspecified cerebral artery: Secondary | ICD-10-CM | POA: Diagnosis present

## 2017-05-11 DIAGNOSIS — IMO0002 Reserved for concepts with insufficient information to code with codable children: Secondary | ICD-10-CM

## 2017-05-11 DIAGNOSIS — E1159 Type 2 diabetes mellitus with other circulatory complications: Secondary | ICD-10-CM

## 2017-05-11 DIAGNOSIS — Z9119 Patient's noncompliance with other medical treatment and regimen: Secondary | ICD-10-CM | POA: Diagnosis not present

## 2017-05-11 HISTORY — DX: Cerebral infarction due to thrombosis of unspecified cerebral artery: I63.30

## 2017-05-11 LAB — CBC
HEMATOCRIT: 48.7 % (ref 39.0–52.0)
HEMOGLOBIN: 16.4 g/dL (ref 13.0–17.0)
MCH: 29.9 pg (ref 26.0–34.0)
MCHC: 33.7 g/dL (ref 30.0–36.0)
MCV: 88.9 fL (ref 78.0–100.0)
PLATELETS: 304 10*3/uL (ref 150–400)
RBC: 5.48 MIL/uL (ref 4.22–5.81)
RDW: 12.8 % (ref 11.5–15.5)
WBC: 12.2 10*3/uL — AB (ref 4.0–10.5)

## 2017-05-11 LAB — BASIC METABOLIC PANEL
BUN/Creatinine Ratio: 18 (ref 9–20)
BUN: 28 mg/dL — AB (ref 6–20)
CALCIUM: 10.1 mg/dL (ref 8.7–10.2)
CHLORIDE: 95 mmol/L — AB (ref 96–106)
CO2: 25 mmol/L (ref 20–29)
Creatinine, Ser: 1.59 mg/dL — ABNORMAL HIGH (ref 0.76–1.27)
GFR calc non Af Amer: 56 mL/min/{1.73_m2} — ABNORMAL LOW (ref >59)
GFR, EST AFRICAN AMERICAN: 65 mL/min/{1.73_m2} (ref >59)
Glucose: 249 mg/dL — ABNORMAL HIGH (ref 65–99)
Potassium: 5.2 mmol/L (ref 3.5–5.2)
Sodium: 136 mmol/L (ref 134–144)

## 2017-05-11 LAB — I-STAT VENOUS BLOOD GAS, ED
Acid-Base Excess: 2 mmol/L (ref 0.0–2.0)
BICARBONATE: 27.3 mmol/L (ref 20.0–28.0)
O2 Saturation: 76 %
PCO2 VEN: 45 mmHg (ref 44.0–60.0)
PH VEN: 7.391 (ref 7.250–7.430)
TCO2: 29 mmol/L (ref 22–32)
pO2, Ven: 42 mmHg (ref 32.0–45.0)

## 2017-05-11 LAB — COMPREHENSIVE METABOLIC PANEL
ALT: 63 U/L (ref 17–63)
ANION GAP: 11 (ref 5–15)
AST: 33 U/L (ref 15–41)
Albumin: 4.2 g/dL (ref 3.5–5.0)
Alkaline Phosphatase: 88 U/L (ref 38–126)
BUN: 33 mg/dL — ABNORMAL HIGH (ref 6–20)
CHLORIDE: 105 mmol/L (ref 101–111)
CO2: 23 mmol/L (ref 22–32)
CREATININE: 1.69 mg/dL — AB (ref 0.61–1.24)
Calcium: 9.8 mg/dL (ref 8.9–10.3)
GFR, EST AFRICAN AMERICAN: 60 mL/min — AB (ref >60)
GFR, EST NON AFRICAN AMERICAN: 52 mL/min — AB (ref >60)
Glucose, Bld: 208 mg/dL — ABNORMAL HIGH (ref 65–99)
POTASSIUM: 4.5 mmol/L (ref 3.5–5.1)
SODIUM: 139 mmol/L (ref 135–145)
Total Bilirubin: 0.9 mg/dL (ref 0.3–1.2)
Total Protein: 7.8 g/dL (ref 6.5–8.1)

## 2017-05-11 LAB — ETHANOL

## 2017-05-11 LAB — AMMONIA: Ammonia: 34 umol/L (ref 9–35)

## 2017-05-11 LAB — CBG MONITORING, ED: Glucose-Capillary: 202 mg/dL — ABNORMAL HIGH (ref 65–99)

## 2017-05-11 MED ORDER — SODIUM CHLORIDE 0.9 % IV SOLN
INTRAVENOUS | Status: DC
  Administered 2017-05-11: 23:00:00 via INTRAVENOUS

## 2017-05-11 MED ORDER — INSULIN GLARGINE 100 UNIT/ML ~~LOC~~ SOLN
8.0000 [IU] | Freq: Every day | SUBCUTANEOUS | Status: DC
  Administered 2017-05-12 (×2): 8 [IU] via SUBCUTANEOUS
  Filled 2017-05-11 (×3): qty 0.08

## 2017-05-11 MED ORDER — ACETAMINOPHEN 325 MG PO TABS: 650.0000 mg | ORAL_TABLET | ORAL | Status: DC | PRN

## 2017-05-11 MED ORDER — ACETAMINOPHEN 650 MG RE SUPP: 650.0000 mg | RECTAL | Status: DC | PRN

## 2017-05-11 MED ORDER — SODIUM CHLORIDE 0.9 % IV SOLN
INTRAVENOUS | Status: AC
  Administered 2017-05-12: 02:00:00 via INTRAVENOUS

## 2017-05-11 MED ORDER — STROKE: EARLY STAGES OF RECOVERY BOOK
Freq: Once | Status: DC
  Filled 2017-05-11: qty 1

## 2017-05-11 MED ORDER — SENNOSIDES-DOCUSATE SODIUM 8.6-50 MG PO TABS: 1.0000 | ORAL_TABLET | Freq: Every evening | ORAL | Status: DC | PRN

## 2017-05-11 MED ORDER — SODIUM CHLORIDE 0.9 % IV BOLUS (SEPSIS)
1000.0000 mL | Freq: Once | INTRAVENOUS | Status: AC
  Administered 2017-05-11: 1000 mL via INTRAVENOUS

## 2017-05-11 MED ORDER — ACETAMINOPHEN 160 MG/5ML PO SOLN: 650.0000 mg | ORAL | Status: DC | PRN

## 2017-05-11 MED ORDER — INSULIN ASPART 100 UNIT/ML ~~LOC~~ SOLN
0.0000 [IU] | Freq: Three times a day (TID) | SUBCUTANEOUS | Status: DC
  Administered 2017-05-12: 5 [IU] via SUBCUTANEOUS
  Administered 2017-05-12: 2 [IU] via SUBCUTANEOUS
  Administered 2017-05-13: 1 [IU] via SUBCUTANEOUS
  Administered 2017-05-13: 3 [IU] via SUBCUTANEOUS

## 2017-05-11 NOTE — ED Notes (Signed)
The male with the pt is upset that  The pt has been here 3 times and he is still having periods of confusion  She is talking to the edp at present.  Pt pleasant  No distress  She wants him to have a mri tonight  She is currently crying

## 2017-05-11 NOTE — H&P (Signed)
Family Medicine Teaching [facility] Admission History and Physical Service Pager: [phone]  Patient name: Thomas Burch Medical record number: [id number] Date of birth: [birth year] Age: 31 y.o. Gender: male  Primary Care Provider: Shirley, [place], DO Consultants: neurology Code Status: FULL  Chief Complaint: confusion, word finding difficulty  Assessment and Plan: Thomas Burch is a 31 y.o. male presenting with confusion, word finding difficulty. 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   Left basal ganglia stroke: Recently admitted with AMS and MRI on 05/02/2017 showing a subcentimeteracute infarct in L occipital cortex, small subacute infarcts post R cerebrum, mild small vessel ischemia. On chart review, thorough workup for hypercoagulability was done during recent admission without revealing cause. Appears event monitor was scheduled for placement 05/19/17. Confusion less likely DKA or HHS given mildly elevated CBG and normal VBG. -admit to tele, Dr. McDiarmid attending -neurology consulted, appreciate recs -MRI brain ordered by ED MD -will defer workup to neurology as patient had recent echo with TEE, Hgba1c, lipid panel, TCD with bubble study -neuro checks -PT/OT/SLP  AKI: reported poor PO intake due to confusion. Cr 0.93 on discharge from most recent admission. Cr max 2.12 in ED when patient admitted to poor PO intake. Cr 1.69 on 9/6. Consider Renal US, FeNa if persistent. May improve with holding home ACEi as below.  -monitor on am BMP -NS 50cc/hr x 12 hours  DM2: Hgba1c 11.9 in recent admission. Discharged on Lantus 13U QD and metformin.  -Lantus 10U QD -sensitive sliding scale  HTN: hx of noncompliance. Recently discharged with clonidine 0.2mg  BID, lisinopril  daily, HCTZ  daily.  -permissive HTN given acute stroke  HFpEF: EF 60-65% on echo 05/04/17, normal wall motion. Clinically euvolemic on exam. -monitor  clinically  FEN/GI: NPO pending bedside swallow Prophylaxis: SCDs during acute stroke workup  Disposition: admit to tele   History of Present Illness:  Thomas Burch is a 31 y.o. male presenting with word finding difficulties, confusion. Male with patient reports they are long term best friends. She gives the majority of the history. She reports he was himself after discharge on 8/30, but was intermittently confused starting Sunday 9/2. She reports he has trouble producing words at all, trouble finding words in a sensible order, and also trouble producing vocalizations that sound like real words. She reports that he has intermittently told her that he isn't sure how to take his medicines and can't recall when he took them. They report a long term history of HTN and limited compliance with antihypertensives. Patient denies EtOH, drug use.   Male in room very agitated, speaking loudly with this MD, stating she is angry with "the whole hospital," that the patient did not get a "scan of his head, when he was very unstable" during recent ED visit. Additionally, she repeatedly asks if he will have permanent brain damage. Asks many questions about etiology of stroke and whether we can dissolve the clot. Counseled her that neurology can best answer these questions, but that we cannot predict recovery from strokes and we will continue to recommend ways to modify his risk of future events. Male later apologized that she had lashed out.   Review Of Systems: Per HPI with the following additions: denies HA, CP, SOB, abd pain, dysuria, fever, chills, cough, changes in BMs, new weakness.   ROS  Patient Active Problem List   Diagnosis Date Noted  . Diabetes mellitus type I (HCC)   . Hyperlipidemia   .  CVA (cerebral vascular accident) (HCC) 05/02/2017  . Encephalitis   . Hypertensive urgency   . Lactic acidosis   . Altered mental status, unspecified 04/29/2017  . Elevated temperature   . Cough   .  Hyperglycemia   . Hypertension 02/01/2014  . Obesity, Class II, BMI 35-39.9, with comorbidity 02/01/2014    Past Medical History: Past Medical History:  Diagnosis Date  . Headache   . Hypertension   . Stroke [facility])     Past Surgical History: Past Surgical History:  Procedure Laterality Date  . TEE WITHOUT CARDIOVERSION N/A 05/04/2017   Procedure: TRANSESOPHAGEAL ECHOCARDIOGRAM (TEE);  Surgeon: Elease Hashimoto Deloris Ping, MD;  Location: [facility] ENDOSCOPY;  Service: Cardiovascular;  Laterality: N/A;    Social History: Social History  Substance Use Topics  . Smoking status: Never Smoker  . Smokeless tobacco: Never Used  . Alcohol use No     Comment: 1 drink per week   Additional social history: lives with grandmother  Please also refer to relevant sections of EMR.  Family History: Family History  Problem Relation Age of Onset  . Hypertension Father   . Stroke Father   . Hyperlipidemia Father   . Diabetes Maternal Grandmother     Allergies and Medications: No Known Allergies No current facility-administered medications on file prior to encounter.    Current Outpatient Prescriptions on File Prior to Encounter  Medication Sig Dispense Refill  . aspirin EC 325 MG EC tablet Take 1 tablet (325 mg total) by mouth daily. (Patient not taking: Reported on 05/08/2017) 30 tablet 0  . atorvastatin (LIPITOR) 80 MG tablet Take 1 tablet (80 mg total) by mouth daily at 6 PM. (Patient not taking: Reported on 05/08/2017) 30 tablet 0  . cloNIDine (CATAPRES) 0.2 MG tablet Take 1 tablet (0.2 mg total) by mouth 2 (two) times daily. 30 tablet 0  . clopidogrel (PLAVIX) 75 MG tablet Take 1 tablet (75 mg total) by mouth daily. (Patient not taking: Reported on 05/08/2017) 30 tablet 0  . hydrochlorothiazide (HYDRODIURIL) 25 MG tablet Take 1 tablet (25 mg total) by mouth daily. 30 tablet 0  . Insulin Glargine (LANTUS) 100 UNIT/ML Solostar Pen Inject 13 Units into the skin every morning. 15 mL 1  . lisinopril  (PRINIVIL,ZESTRIL) 40 MG tablet Take 1 tablet (40 mg total) by mouth daily. 30 tablet 0    Objective: BP 133/86   Pulse 86   Temp 98.5 F (36.9 C) (Oral)   Resp 16   SpO2 96%  Exam: General: obese male resting in bed in NAD, speaking in full clear sentences.  Eyes: EOMI, PEERLA ENTM: MMM, normal dentition Neck: supple, no JVD  Cardiovascular: RRR, no murmur  Respiratory: CTAB, easy WOB  Gastrointestinal: SNTND, +BS  MSK: no edema, warm Derm: no rashes Neuro: CN II-XII grossly intact, Strength 5/5 in all extremities. Sensation intact throughout. No word finding difficulties during history taking. Psych: mood and affect appropriate  Labs and Imaging: CBC BMET   Recent Labs Lab 05/11/17 1834  WBC 12.2*  HGB 16.4  HCT 48.7  PLT 304    Recent Labs Lab 05/11/17 1834  NA 139  K 4.5  CL 105  CO2 23  BUN 33*  CREATININE 1.69*  GLUCOSE 208*  CALCIUM 9.8     Ammonia WNL, ethanol <5, VBG WNL Ct Angio Head W Or Wo Contrast  Result Date: 05/03/2017 CLINICAL DATA:  Follow-up stroke.  History of hypertension. EXAM: CT ANGIOGRAPHY HEAD AND NECK TECHNIQUE: Multidetector CT imaging of  the head and neck was performed using the standard protocol during bolus administration of intravenous contrast. Multiplanar CT image reconstructions and MIPs were obtained to evaluate the vascular anatomy. Carotid stenosis measurements (when applicable) are obtained utilizing NASCET criteria, using the distal internal carotid diameter as the denominator. CONTRAST:  50 cc Isovue 370 COMPARISON:  MRI of the head May 02, 2017 and CT HEAD April 29, 2017 FINDINGS: CT HEAD FINDINGS BRAIN: No intraparenchymal hemorrhage, mass effect nor midline shift. Patchy supratentorial white matter are type with densities. The ventricles and sulci are mildly prominent for age. No acute large vascular territory infarcts. No abnormal extra-axial fluid collections. Basal cisterns are patent. VASCULAR: Unremarkable.  SKULL/SOFT TISSUES: No skull fracture. No significant soft tissue swelling. ORBITS/SINUSES: The included ocular globes and orbital contents are normal.The mastoid aircells and included paranasal sinuses are well-aerated. OTHER: None. CTA NECK AORTIC ARCH: Normal appearance of the thoracic arch, 2 vessel arch is a normal variant. The origins of the innominate, left Common carotid artery and subclavian artery are widely patent. RIGHT CAROTID SYSTEM: Common carotid artery is widely patent, coursing in a straight line fashion. Mild eccentric calcific atherosclerosis of the carotid bifurcation without hemodynamically significant stenosis by NASCET criteria. Normal appearance of the included internal carotid artery. LEFT CAROTID SYSTEM: Common carotid artery is widely patent, coursing in a straight line fashion. Mild eccentric calcific atherosclerosis of the carotid bifurcation without hemodynamically significant stenosis by NASCET criteria. Normal appearance of the included internal carotid artery. VERTEBRAL ARTERIES:The codominant vertebral artery's. Normal appearance of the vertebral arteries, which appear widely patent. SKELETON: No acute osseous process though bone windows have not been submitted. OTHER NECK: Soft tissues of the neck are nonacute though, not tailored for evaluation. UPPER CHEST: Included lung apices are clear. No superior mediastinal lymphadenopathy. CTA HEAD ANTERIOR CIRCULATION: Patent cervical internal carotid arteries, petrous, cavernous and supra clinoid internal carotid arteries. Widely patent anterior communicating artery. Patent anterior and middle cerebral arteries. Mild luminal irregularity bilateral mid to distal middle cerebral artery's. No large vessel occlusion, significant stenosis, contrast extravasation or aneurysm. POSTERIOR CIRCULATION: Patent vertebral arteries, vertebrobasilar junction and basilar artery, as well as main branch vessels. Patent posterior cerebral arteries. Severe  stenosis RIGHT proximal P2 segment, moderate stenosis RIGHT mid P1 segment. Mild luminal irregularity LEFT posterior cerebral artery. No large vessel occlusion, contrast extravasation or aneurysm. VENOUS SINUSES: Major dural venous sinuses are patent though not tailored for evaluation on this angiographic examination. ANATOMIC VARIANTS: None. DELAYED PHASE: No abnormal intracranial enhanced. MIP images reviewed. IMPRESSION: CT HEAD: 1. No acute intracranial process ; patient's known acute small infarcts not apparent by CT. 2. Mild chronic small vessel ischemic disease. 3. Borderline parenchymal brain volume loss for age. CTA NECK: 1. Mild atherosclerosis without hemodynamically significant stenosis or acute vascular process. CTA HEAD: 1. No emergent large vessel occlusion. 2. Severe stenosis RIGHT proximal P2 segment. Moderate stenosis RIGHT P1 segment. 3. Mild cerebral artery luminal irregularity compatible with atherosclerosis. Electronically Signed   By: Awilda Metroourtnay  Bloomer M.D.   On: 05/03/2017 02:04   Dg Chest 2 View  Result Date: 04/29/2017 CLINICAL DATA:  Fever and dizziness for 1 day. EXAM: CHEST  2 VIEW COMPARISON:  None. FINDINGS: The heart size and mediastinal contours are within normal limits. Both lungs are clear. The visualized skeletal structures are unremarkable. IMPRESSION: No active cardiopulmonary disease. Electronically Signed   By: Myles RosenthalJohn  Stahl M.D.   On: 04/29/2017 11:20   Ct Head Wo Contrast  Result Date: 05/11/2017 CLINICAL DATA:  Altered level of consciousness.  Recent stroke. EXAM: CT HEAD WITHOUT CONTRAST TECHNIQUE: Contiguous axial images were obtained from the base of the skull through the vertex without intravenous contrast. COMPARISON:  Head CT 05/03/2017.  Brain MRI 05/02/2017 FINDINGS: Brain: No evidence of hemorrhage, hydrocephalus, extra-axial collection or mass lesion/mass effect. There is new low density in the left basal ganglia from prior exam consistent with interval  ischemia. Expected encephalomalacia in subcentimeter infarct in the left occipital lobe since prior. Mild chronic small vessel ischemia. Stable brain volume with borderline atrophy for age. Vascular: No hyperdense vessel or unexpected calcification. Skull: Normal. Negative for fracture or focal lesion. Sinuses/Orbits: Paranasal sinuses and mastoid air cells are clear. The visualized orbits are unremarkable. Other: None. IMPRESSION: 1. Low-density in the left basal ganglia consistent with subacute lacunar infarcts, new exams 1 week prior. 2. Expected evolution of subcentimeter left occipital infarct. 3. No intracranial hemorrhage. Electronically Signed   By: Rubye Oaks M.D.   On: 05/11/2017 21:35   Ct Head Wo Contrast  Result Date: 04/29/2017 CLINICAL DATA:  Pt c/o ha and fever since last night. EXAM: CT HEAD WITHOUT CONTRAST TECHNIQUE: Contiguous axial images were obtained from the base of the skull through the vertex without intravenous contrast. COMPARISON:  None. FINDINGS: Brain: No evidence of acute infarction, hemorrhage, hydrocephalus, extra-axial collection or mass lesion/mass effect. Vascular: No hyperdense vessel or unexpected calcification. Skull: Normal. Negative for fracture or focal lesion. Sinuses/Orbits: No acute finding. Other: None. IMPRESSION: 1. Negative for bleed or other acute intracranial process. Electronically Signed   By: Corlis Leak M.D.   On: 04/29/2017 11:51   Ct Angio Neck W Or Wo Contrast  Result Date: 05/03/2017 CLINICAL DATA:  Follow-up stroke.  History of hypertension. EXAM: CT ANGIOGRAPHY HEAD AND NECK TECHNIQUE: Multidetector CT imaging of the head and neck was performed using the standard protocol during bolus administration of intravenous contrast. Multiplanar CT image reconstructions and MIPs were obtained to evaluate the vascular anatomy. Carotid stenosis measurements (when applicable) are obtained utilizing NASCET criteria, using the distal internal carotid  diameter as the denominator. CONTRAST:  50 cc Isovue 370 COMPARISON:  MRI of the head May 02, 2017 and CT HEAD April 29, 2017 FINDINGS: CT HEAD FINDINGS BRAIN: No intraparenchymal hemorrhage, mass effect nor midline shift. Patchy supratentorial white matter are type with densities. The ventricles and sulci are mildly prominent for age. No acute large vascular territory infarcts. No abnormal extra-axial fluid collections. Basal cisterns are patent. VASCULAR: Unremarkable. SKULL/SOFT TISSUES: No skull fracture. No significant soft tissue swelling. ORBITS/SINUSES: The included ocular globes and orbital contents are normal.The mastoid aircells and included paranasal sinuses are well-aerated. OTHER: None. CTA NECK AORTIC ARCH: Normal appearance of the thoracic arch, 2 vessel arch is a normal variant. The origins of the innominate, left Common carotid artery and subclavian artery are widely patent. RIGHT CAROTID SYSTEM: Common carotid artery is widely patent, coursing in a straight line fashion. Mild eccentric calcific atherosclerosis of the carotid bifurcation without hemodynamically significant stenosis by NASCET criteria. Normal appearance of the included internal carotid artery. LEFT CAROTID SYSTEM: Common carotid artery is widely patent, coursing in a straight line fashion. Mild eccentric calcific atherosclerosis of the carotid bifurcation without hemodynamically significant stenosis by NASCET criteria. Normal appearance of the included internal carotid artery. VERTEBRAL ARTERIES:The codominant vertebral artery's. Normal appearance of the vertebral arteries, which appear widely patent. SKELETON: No acute osseous process though bone windows have not been submitted. OTHER NECK: Soft tissues of the neck are  nonacute though, not tailored for evaluation. UPPER CHEST: Included lung apices are clear. No superior mediastinal lymphadenopathy. CTA HEAD ANTERIOR CIRCULATION: Patent cervical internal carotid arteries,  petrous, cavernous and supra clinoid internal carotid arteries. Widely patent anterior communicating artery. Patent anterior and middle cerebral arteries. Mild luminal irregularity bilateral mid to distal middle cerebral artery's. No large vessel occlusion, significant stenosis, contrast extravasation or aneurysm. POSTERIOR CIRCULATION: Patent vertebral arteries, vertebrobasilar junction and basilar artery, as well as main branch vessels. Patent posterior cerebral arteries. Severe stenosis RIGHT proximal P2 segment, moderate stenosis RIGHT mid P1 segment. Mild luminal irregularity LEFT posterior cerebral artery. No large vessel occlusion, contrast extravasation or aneurysm. VENOUS SINUSES: Major dural venous sinuses are patent though not tailored for evaluation on this angiographic examination. ANATOMIC VARIANTS: None. DELAYED PHASE: No abnormal intracranial enhanced. MIP images reviewed. IMPRESSION: CT HEAD: 1. No acute intracranial process ; patient's known acute small infarcts not apparent by CT. 2. Mild chronic small vessel ischemic disease. 3. Borderline parenchymal brain volume loss for age. CTA NECK: 1. Mild atherosclerosis without hemodynamically significant stenosis or acute vascular process. CTA HEAD: 1. No emergent large vessel occlusion. 2. Severe stenosis RIGHT proximal P2 segment. Moderate stenosis RIGHT P1 segment. 3. Mild cerebral artery luminal irregularity compatible with atherosclerosis. Electronically Signed   By: Awilda Metro M.D.   On: 05/03/2017 02:04   Mr Brain Wo Contrast  Result Date: 05/02/2017 CLINICAL DATA:  Altered mental status. EXAM: MRI HEAD WITHOUT CONTRAST TECHNIQUE: Multiplanar, multiecho pulse sequences of the brain and surrounding structures were obtained without intravenous contrast. COMPARISON:  Head CT 3 days ago FINDINGS: Brain: 6 mm acute cortical infarct in the parasagittal left occipital cortex. Abnormal but less intense diffusion hyperintensity in the deep right  cerebral white matter (posterior corona radiata), and cortex of the right occipital temporal lobe - these are in the right MCA/ posterior border zone distribution. There is patchy FLAIR hyperintensity in the cerebral white matter, mild but abnormal for age. Patient's history of diabetes and hypertension and this is likely premature chronic microvascular ischemia. No typical multiple sclerosis type pattern. No hemorrhage, hydrocephalus, or masslike findings. Vascular: Major flow voids are preserved Skull and upper cervical spine: Negative for marrow lesion Sinuses/Orbits: Negative IMPRESSION: 1. 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 2 even smaller and likely subacute infarcts in the posterior right cerebrum. 2. Mild but premature white matter signal abnormality, patient's medical history suggesting chronic small vessel ischemia. Electronically Signed   By: Marnee Spring M.D.   On: 05/02/2017 14:01    Garth Bigness, MD 05/11/2017, 10:57 PM PGY-2, [facility] FPTS Intern pager: [phone], text pages welcome

## 2017-05-11 NOTE — ED Notes (Signed)
[place]. Patient able to answer questions for person place and time. Able to follow commands to close eyes, make a fist. Clear speech at this time. Friend with patient says that he will become disoriented at times with slurred speech; that he will be lucid then have altered mental status.

## 2017-05-11 NOTE — Telephone Encounter (Signed)
Thomas Burch called at 22.31 hours last night -05-10-2017 - about Thomas Burch. She repeatedly told me that the patient had slurred speech but she was not anywhere close to the patient, could not leave her home because of small children ...etc. I told her no less than 4 times that the patient would need to go to the ED and she answered " they have seen him and send him home- they do nothing" . I advised that she could call EMS to come to his house if she is concerned - she declined. She [facility] and I explained I can not change appointments from my home at almost 11 PM.

## 2017-05-11 NOTE — ED Notes (Signed)
Patient agrees to allow updates to be given to Tobi BastosMica Weibe [phone], if number is asked for given pass code of [phone]

## 2017-05-11 NOTE — ED Provider Notes (Signed)
[facility]-EMERGENCY DEPT Provider Note   CSN: [id number] Arrival date & time: 05/11/17  1821     History   Chief Complaint Chief Complaint  Patient presents with  . Altered Mental Status    HPI Thomas Burch is a 31 y.o. male.  HPI  31 year old male with a history of hypertension, diabetes, recent strokes who presents to the emergency department for 1 week of confusion and word finding difficulty. Per patient's friend, the symptoms appear to be constant however fluctuating in nature.no alleviating or aggravating factors. Patient denied any focal weakness, difficulty swallowing. He denies any recent fevers, infections and chest pain, shortness of breath nausea, vomiting.   Patient was admitted 1-1/2-2 weeks ago for a CVA. At that time had an extensive workup including procoagulant labs, lumbar punctures that ruled out meningitis or vasculitis.   Past Medical History:  Diagnosis Date  . Headache   . Hypertension   . Stroke [facility])     Patient Active Problem List   Diagnosis Date Noted  . Stroke (HCC) 05/11/2017  . Diabetes mellitus (HCC)   . Hyperlipidemia   . CVA (cerebral vascular accident) (HCC) 05/02/2017  . Encephalitis   . Hypertensive urgency   . Lactic acidosis   . Altered mental status, unspecified 04/29/2017  . Elevated temperature   . Cough   . Hyperglycemia   . Hypertension 02/01/2014  . Obesity, Class II, BMI 35-39.9, with comorbidity 02/01/2014    Past Surgical History:  Procedure Laterality Date  . TEE WITHOUT CARDIOVERSION N/A 05/04/2017   Procedure: TRANSESOPHAGEAL ECHOCARDIOGRAM (TEE);  Surgeon: Elease Hashimoto Deloris Ping, MD;  Location: [facility] ENDOSCOPY;  Service: Cardiovascular;  Laterality: N/A;       Home Medications    Prior to Admission medications   Medication Sig Start Date End Date Taking? Authorizing Provider  atorvastatin (LIPITOR) 80 MG tablet Take 1 tablet (80 mg total) by mouth daily at 6 PM. 05/04/17  Yes Talbert Forest, [place], DO  cloNIDine (CATAPRES)  0.2 MG tablet Take 1 tablet (0.2 mg total) by mouth 2 (two) times daily. 05/04/17  Yes Shirley, [place], DO  clopidogrel (PLAVIX) 75 MG tablet Take 1 tablet (75 mg total) by mouth daily. 05/05/17  Yes Talbert Forest, [place], DO  hydrochlorothiazide (HYDRODIURIL) 25 MG tablet Take 1 tablet (25 mg total) by mouth daily. 05/05/17  Yes Talbert Forest, [place], DO  Insulin Glargine (LANTUS) 100 UNIT/ML Solostar Pen Inject 13 Units into the skin every morning. 05/04/17  Yes Talbert Forest, [place], DO  lisinopril (PRINIVIL,ZESTRIL) 40 MG tablet Take 1 tablet (40 mg total) by mouth daily. 05/05/17  Yes Shirley, [place], DO  aspirin EC 325 MG EC tablet Take 1 tablet (325 mg total) by mouth daily. Patient not taking: Reported on 05/08/2017 05/05/17   Shirley, [place], DO    Family History Family History  Problem Relation Age of Onset  . Hypertension Father   . Stroke Father   . Hyperlipidemia Father   . Diabetes Maternal Grandmother     Social History Social History  Substance Use Topics  . Smoking status: Never Smoker  . Smokeless tobacco: Never Used  . Alcohol use No     Comment: 1 drink per week     Allergies   Patient has no known allergies.   Review of Systems Review of Systems All other systems are reviewed and are negative for acute change except as noted in the HPI   Physical Exam Updated Vital Signs BP 127/78   Pulse 85   Temp 98.5 F (36.9  C) (Oral)   Resp 18   SpO2 94%   Physical Exam  Neurological:  Mental Status: Alert and oriented to person, place, and time. Attention and concentration normal. Speech clear but slowed. 0/3 word recall.  Cranial Nerves  II Visual Fields: Intact to confrontation. Visual fields intact. III, IV, VI: Pupils equal and reactive to light and near. Full eye movement without nystagmus  V Facial Sensation: Normal. No weakness of masticatory muscles  VII: No facial weakness or asymmetry  VIII Auditory Acuity: Grossly normal  IX/X: The uvula is midline; the palate  elevates symmetrically  XI: Normal sternocleidomastoid and trapezius strength  XII: The tongue is midline. No atrophy or fasciculations.   Motor System: Muscle Strength: 5/5 and symmetric in the upper and lower extremities. No pronation or drift.  Muscle Tone: Tone and muscle bulk are normal in the upper and lower extremities.   Reflexes: DTRs: 1+ and symmetrical in all four extremities. Plantar responses are flexor bilaterally.  Coordination: Intact finger-to-nose, heel-to-shin. No tremor.  Sensation: Intact to light touch, and pinprick.  Gait: deferred      ED Treatments / Results  Labs (all labs ordered are listed, but only abnormal results are displayed) Labs Reviewed  COMPREHENSIVE METABOLIC PANEL - Abnormal; Notable for the following:       Result Value   Glucose, Bld 208 (*)    BUN 33 (*)    Creatinine, Ser 1.69 (*)    GFR calc non Af Amer 52 (*)    GFR calc Af Amer 60 (*)    All other components within normal limits  CBC - Abnormal; Notable for the following:    WBC 12.2 (*)    All other components within normal limits  CBG MONITORING, ED - Abnormal; Notable for the following:    Glucose-Capillary 202 (*)    All other components within normal limits  AMMONIA  ETHANOL  URINALYSIS, COMPLETE (UACMP) WITH MICROSCOPIC  BLOOD GAS, VENOUS  I-STAT VENOUS BLOOD GAS, ED    EKG  EKG Interpretation None       Radiology Ct Head Wo Contrast  Result Date: 05/11/2017 CLINICAL DATA:  Altered level of consciousness.  Recent stroke. EXAM: CT HEAD WITHOUT CONTRAST TECHNIQUE: Contiguous axial images were obtained from the base of the skull through the vertex without intravenous contrast. COMPARISON:  Head CT 05/03/2017.  Brain MRI 05/02/2017 FINDINGS: Brain: No evidence of hemorrhage, hydrocephalus, extra-axial collection or mass lesion/mass effect. There is new low density in the left basal ganglia from prior exam consistent with interval ischemia. Expected encephalomalacia in  subcentimeter infarct in the left occipital lobe since prior. Mild chronic small vessel ischemia. Stable brain volume with borderline atrophy for age. Vascular: No hyperdense vessel or unexpected calcification. Skull: Normal. Negative for fracture or focal lesion. Sinuses/Orbits: Paranasal sinuses and mastoid air cells are clear. The visualized orbits are unremarkable. Other: None. IMPRESSION: 1. Low-density in the left basal ganglia consistent with subacute lacunar infarcts, new exams 1 week prior. 2. Expected evolution of subcentimeter left occipital infarct. 3. No intracranial hemorrhage. Electronically Signed   By: Rubye Oaks M.D.   On: 05/11/2017 21:35    Procedures Procedures (including critical care time)  Medications Ordered in ED Medications  sodium chloride 0.9 % bolus 1,000 mL (0 mLs Intravenous Stopped 05/11/17 2324)    And  0.9 %  sodium chloride infusion ( Intravenous Transfusing/Transfer 05/11/17 2325)   stroke: mapping our early stages of recovery book (not administered)  0.9 %  sodium chloride infusion (not administered)  acetaminophen (TYLENOL) tablet 650 mg (not administered)    Or  acetaminophen (TYLENOL) solution 650 mg (not administered)    Or  acetaminophen (TYLENOL) suppository 650 mg (not administered)  senna-docusate (Senokot-S) tablet 1 tablet (not administered)  insulin glargine (LANTUS) injection 8 Units (not administered)  insulin aspart (novoLOG) injection 0-9 Units (not administered)     Initial Impression / Assessment and Plan / ED Course  I have reviewed the triage vital signs and the nursing notes.  Pertinent labs & imaging results that were available during my care of the patient were reviewed by me and considered in my medical decision making (see chart for details).     CT head revealed subacute's lacunar infarct, new from recent MRI performed 1 week ago.   Discussed case with neurology who will follow along. 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

## 2017-05-11 NOTE — ED Triage Notes (Signed)
Per pts friend he has been altered since his discharge from the hospital on Tuesday. Pt was seen within the last month ans diagnosed with multiple TIA's. She reports that he has had increased confusion and difficulty speaking. Pt alert and oriented in triage. Friend reports that symptoms are intermittent and will slur speech. No neuro deficits in triage.

## 2017-05-11 NOTE — ED Notes (Signed)
Admitting MD at bedside.

## 2017-05-11 NOTE — ED Notes (Signed)
Patient requesting to know if he can take his 9pm medications. MD made aware, to see patient soon.

## 2017-05-11 NOTE — Telephone Encounter (Signed)
Micha patient friend (not listed on DPR) called earlier this morning and again this afternoon.  This AM she about patient being confused and slurred speech (increased symptoms).  After speaking with Butch PennyMegan Millikan NP she advised for patient to go to ED.  Second time Micha called she called about the same symptoms and Dr. Warren DanesXu's nurse was contacted.  Dr. Warren DanesXu's nurse contacted him with patient's complaints per Dr. Roda ShuttersXu he advised patient seek nearest ED to be evaluated again because of recent stroke.

## 2017-05-12 ENCOUNTER — Encounter (HOSPITAL_COMMUNITY): Payer: Self-pay | Admitting: *Deleted

## 2017-05-12 ENCOUNTER — Inpatient Hospital Stay (HOSPITAL_COMMUNITY): Payer: Commercial Managed Care - PPO

## 2017-05-12 DIAGNOSIS — R4701 Aphasia: Secondary | ICD-10-CM | POA: Diagnosis present

## 2017-05-12 DIAGNOSIS — R4702 Dysphasia: Secondary | ICD-10-CM

## 2017-05-12 DIAGNOSIS — E669 Obesity, unspecified: Secondary | ICD-10-CM

## 2017-05-12 DIAGNOSIS — Z8679 Personal history of other diseases of the circulatory system: Secondary | ICD-10-CM | POA: Insufficient documentation

## 2017-05-12 DIAGNOSIS — N179 Acute kidney failure, unspecified: Secondary | ICD-10-CM

## 2017-05-12 DIAGNOSIS — E785 Hyperlipidemia, unspecified: Secondary | ICD-10-CM

## 2017-05-12 HISTORY — DX: Dysphasia: R47.02

## 2017-05-12 LAB — URINALYSIS, COMPLETE (UACMP) WITH MICROSCOPIC
Bilirubin Urine: NEGATIVE
GLUCOSE, UA: NEGATIVE mg/dL
HGB URINE DIPSTICK: NEGATIVE
Ketones, ur: NEGATIVE mg/dL
LEUKOCYTES UA: NEGATIVE
NITRITE: NEGATIVE
PH: 5 (ref 5.0–8.0)
PROTEIN: NEGATIVE mg/dL
SPECIFIC GRAVITY, URINE: 1.016 (ref 1.005–1.030)
SQUAMOUS EPITHELIAL / LPF: NONE SEEN

## 2017-05-12 LAB — RAPID URINE DRUG SCREEN, HOSP PERFORMED
AMPHETAMINES: NOT DETECTED
BENZODIAZEPINES: NOT DETECTED
Barbiturates: NOT DETECTED
Cocaine: NOT DETECTED
OPIATES: NOT DETECTED
Tetrahydrocannabinol: NOT DETECTED

## 2017-05-12 LAB — BASIC METABOLIC PANEL
Anion gap: 7 (ref 5–15)
BUN: 25 mg/dL — AB (ref 6–20)
CHLORIDE: 106 mmol/L (ref 101–111)
CO2: 26 mmol/L (ref 22–32)
Calcium: 9 mg/dL (ref 8.9–10.3)
Creatinine, Ser: 1.51 mg/dL — ABNORMAL HIGH (ref 0.61–1.24)
GFR calc Af Amer: >60 mL/min (ref >60)
GFR calc non Af Amer: 59 mL/min — ABNORMAL LOW (ref >60)
GLUCOSE: 267 mg/dL — AB (ref 65–99)
POTASSIUM: 4.2 mmol/L (ref 3.5–5.1)
Sodium: 139 mmol/L (ref 135–145)

## 2017-05-12 LAB — GLUCOSE, CAPILLARY
Glucose-Capillary: 159 mg/dL — ABNORMAL HIGH (ref 65–99)
Glucose-Capillary: 166 mg/dL — ABNORMAL HIGH (ref 65–99)
Glucose-Capillary: 171 mg/dL — ABNORMAL HIGH (ref 65–99)
Glucose-Capillary: 190 mg/dL — ABNORMAL HIGH (ref 65–99)
Glucose-Capillary: 266 mg/dL — ABNORMAL HIGH (ref 65–99)

## 2017-05-12 LAB — FACTOR 5 LEIDEN

## 2017-05-12 LAB — PROTEIN / CREATININE RATIO, URINE
Creatinine, Urine: 112.9 mg/dL
PROTEIN CREATININE RATIO: 0.08 mg/mg{creat} (ref 0.00–0.15)
Total Protein, Urine: 9 mg/dL

## 2017-05-12 MED ORDER — LORAZEPAM 2 MG/ML IJ SOLN
1.0000 mg | Freq: Once | INTRAMUSCULAR | Status: AC | PRN
  Administered 2017-05-12: 1 mg via INTRAVENOUS
  Filled 2017-05-12: qty 1

## 2017-05-12 MED ORDER — ASPIRIN EC 325 MG PO TBEC
325.0000 mg | DELAYED_RELEASE_TABLET | Freq: Every day | ORAL | Status: DC
  Administered 2017-05-12 – 2017-05-13 (×2): 325 mg via ORAL
  Filled 2017-05-12 (×2): qty 1

## 2017-05-12 MED ORDER — CLOPIDOGREL BISULFATE 75 MG PO TABS
75.0000 mg | ORAL_TABLET | Freq: Every day | ORAL | Status: DC
  Administered 2017-05-12 – 2017-05-13 (×2): 75 mg via ORAL
  Filled 2017-05-12 (×2): qty 1

## 2017-05-12 NOTE — Progress Notes (Signed)
Family Medicine Teaching Service Daily Progress Note Intern Pager: [phone]  Patient name: Thomas Burch Medical record number: [id number] Date of birth: 06/19/1986 Age: 31 y.o. Gender: male  Primary Care Provider: Zayed Griffie, [place], DO Consultants: Neurology Code Status: Full  Pt Overview and Major Events to Date:  Thomas Krafthillip D Jauregui is a 31 y.o. male presenting with confusion, word finding difficulty. 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   Assessment and Plan:  Dysphasia: Improving. Intermittent expressive dysphasia noted on admission and per history.  MRI brain w/ and w/o: Acute/subacute infarct left anterior thalamus and posterior internal capsule as noted on recent CT. 5 mm acute infarct left cingulate gyrus. Negative for hemorrhage. - Prior embolic and hypercoaguable work up has been unrevealing - cardiac monitor was scheduled for 09/14 - Multiple cardiovascular risk factors of metabolic syndrome - Reportedly, patient only took a few doses of DAPT recommended by Neurology on 05/04/17 discharge.  Acute Infarct Left cingulate gyrus: Recently admitted with AMS and MRI on 08/28 showed a subcentimeteracute infarct in L occipital cortex, small subacute infarcts post R cerebrum, mild small vessel ischemia. Thorough workup for hypercoagulability was done without revealing cause. Event monitor was scheduled for placement 05/19/17. CT: New subacute lacunar infarcts in the left basal ganglia. Evolution of subcentimeter left occipital infarct. No intracranial hemorrhage. MRI brain w/ and w/o: Acute/subacute infarct left anterior thalamus and posterior internal capsule as noted on recent CT. 5 mm acute infarct left cingulate gyrus. Negative for hemorrhage. - Patient had been noncompliant with plavix when discharged, encouraged to take at follow up on 09/05 and reportedly did starting that day -neurology consulted, appreciate recs -will defer workup to  neurology as patient had recent echo with TEE, Hgba1c, lipid panel, TCD with bubble study - Factor V Leiden still pending from previous admission - neuro checks - PT/OT consulted - SLP- Recommends continued services for cognitive communication deficit  AKI: reported poor PO intake due to confusion. Cr 0.93 on discharge from most recent admission. Cr max 2.12 in ED when patient admitted due to poor PO intake on 09/05 and given 1 L bolus. Cr 1.51 on 9/7. May improve with holding home ACEi and HCTZ - Consider Renal US, FeNa if persistent.  -monitor on am BMP: 1.51 on 09/07 -NS 50cc/hr x 12 hours  DM2: Hgba1c 11.9 in recent admission. Discharged on Lantus 13U QD and metformin.  -Lantus 8U QD- will trend for possible increase -sensitive sliding scale- received 5 U this am - 160-226 in last 24 hours  HTN: hx of noncompliance. Recently discharged with clonidine 0.2mg  BID, lisinopril 40mg  daily, HCTZ 25mg  daily.  -permissive HTN given acute stroke - Holding HCTZ, Lisinopril - 146/97 was last reported.  HFpEF: EF 60-65% on echo 05/04/17, normal wall motion. Clinically euvolemic on exam. -monitor clinically  FEN/GI: HH/ carb modified diet Prophylaxis: SCDs during acute stroke workup  Disposition: home  Subjective:  Patient reporting that he feels better than last night. He thinks his speech is better. He says he did not take the plavix until follow up with Dr. Jennette KettleNeal on 09/05, but then he started taking it, so he had 1-2 doses only.   Objective: Temp:  [97.9 F (36.6 C)-98.5 F (36.9 C)] 97.9 F (36.6 C) (09/07 0500) Pulse Rate:  [71-99] 71 (09/07 0500) Resp:  [11-22] 16 (09/07 0500) BP: (127-155)/(78-106) 145/106 (09/07 0500) SpO2:  [94 %-99 %] 97 % (09/07 0500) Weight:  [232 lb 4.8 oz (105.4  kg)] 232 lb 4.8 oz (105.4 kg) (09/07 0046)   Physical Exam: General: NAD, sitting in bed, speaking full and clear sentences Cardiovascular: RRR, no murmur  Respiratory: CTAB, easy WOB   Abdomen: nontender, nondistended, normoactive bs Extremities: Moves all 4 extremities equally Neuro: CN II-XII grossly intact, Strength 5/5 in all extremities. Sensation intact throughout. No word finding difficulties during history taking. Psych: mood and affect appropriate, speech normal  Laboratory:  Recent Labs Lab 05/08/17 1946 05/11/17 1834  WBC 13.0* 12.2*  HGB 16.2 16.4  HCT 48.2 48.7  PLT 225 304    Recent Labs Lab 05/08/17 2034 05/10/17 1209 05/11/17 1834  NA 135 136 139  K 4.4 5.2 4.5  CL 102 95* 105  CO2 BUN 39* 28* 33*  CREATININE 2.12* 1.59* 1.69*  CALCIUM 9.5 10.1 9.8  PROT 6.9  --  7.8  BILITOT 0.7  --  0.9  ALKPHOS 74  --  88  ALT 77*  --  63  AST 40  --  33  GLUCOSE 205* 249* 208*    Imaging/Diagnostic Tests: Ct Head Wo Contrast  Result Date: 05/11/2017 CLINICAL DATA:  Altered level of consciousness.  Recent stroke. EXAM: CT HEAD WITHOUT CONTRAST TECHNIQUE: Contiguous axial images were obtained from the base of the skull through the vertex without intravenous contrast. COMPARISON:  Head CT 05/03/2017.  Brain MRI 05/02/2017 FINDINGS: Brain: No evidence of hemorrhage, hydrocephalus, extra-axial collection or mass lesion/mass effect. There is new low density in the left basal ganglia from prior exam consistent with interval ischemia. Expected encephalomalacia in subcentimeter infarct in the left occipital lobe since prior. Mild chronic small vessel ischemia. Stable brain volume with borderline atrophy for age. Vascular: No hyperdense vessel or unexpected calcification. Skull: Normal. Negative for fracture or focal lesion. Sinuses/Orbits: Paranasal sinuses and mastoid air cells are clear. The visualized orbits are unremarkable. Other: None. IMPRESSION: 1. Low-density in the left basal ganglia consistent with subacute lacunar infarcts, new exams 1 week prior. 2. Expected evolution of subcentimeter left occipital infarct. 3. No intracranial hemorrhage.  Electronically Signed   By: Rubye Oaks M.D.   On: 05/11/2017 21:35    Cyndee Giammarco, [place], DO 05/12/2017, 6:39 AM PGY-1, [facility] FPTS Intern pager: [phone], text pages welcome

## 2017-05-12 NOTE — Assessment & Plan Note (Signed)
Reviewed his blood pressure medicines. Continue current therapy

## 2017-05-12 NOTE — Care Management Note (Signed)
Case Management Note  Patient Details  Name: Thomas Burch MRN: [id number] Date of Birth: 08/06/1986  Subjective/Objective: Pt admitted with CVA. He is from home.                  Action/Plan: Awaiting PT/OT recommendations. CM following for d/c needs, physician orders.  Expected Discharge Date:                  Expected Discharge Plan:     In-House Referral:     Discharge planning Services     Post Acute Care Choice:    Choice offered to:     DME Arranged:    DME Agency:     HH Arranged:    HH Agency:     Status of Service:  In process, will continue to follow  If discussed at Long Length of Stay Meetings, dates discussed:    Additional Comments:  Kermit BaloKelli F Amanuel Sinkfield, RN 05/12/2017, 1:27 PM

## 2017-05-12 NOTE — Progress Notes (Signed)
OT Cancellation Note  Patient Details Name: Anda Krafthillip D Stroble MRN: [id number] DOB: 01/08/1986   Cancelled Treatment:    Reason Eval/Treat Not Completed: Patient at procedure or test/ unavailable. Pt at MRI. OT will continue to follow as schedule allows for Eval.   Emelda FearLaura J Jaelen Gellerman 05/12/2017, 11:14 AM  Sherryl MangesLaura Azekiel Cremer OTR/L [phone]

## 2017-05-12 NOTE — Progress Notes (Signed)
STROKE TEAM PROGRESS NOTE   HISTORY OF PRESENT ILLNESS (per record) Thomas Burch is a 31 y.o. male with a history of headache, newly-diagnosed diabetes, hypertension, and recent admission for multifocal ischemic stroke who has been confused since Monday. At the time he went to the emergency department, but felt like his symptoms had resolved and was nonfocal on exam. Since that time, however, his friend states that he has been waxing/waning and confusion and making odd statements at times, such as while they were in the car he said "are you taking me to the cardio train?"  He denies headaches. Negative ethanol and ammonia, UDS pending.  He was just admitted with an extensive workup including CSF (WBC 0 RBC 0 protein 24 glucose 166).  HIV-negative Hyper coag panel-negative so far TSH-normal CRP-1.1 ESR-3   LKW: Monday  Patient was not administered IV t-PA secondary to arriving outside of the treatment window. He was admitted to General Neurology for further evaluation and treatment.   SUBJECTIVE (INTERVAL HISTORY) His mother and father are at the bedside.  The patient is awake, slow to respond, and mildly confused.  Per family, pt was taking plavix, but was not taking aspirin as prescribed.  Continue daily Plavix 75 mg, and add aspirin 81 mg for three weeks.     OBJECTIVE Temp:  [97.3 F (36.3 C)-98.5 F (36.9 C)] 97.3 F (36.3 C) (09/07 0915) Pulse Rate:  [64-99] 85 (09/07 0921) Cardiac Rhythm: Normal sinus rhythm (09/07 0746) Resp:  [11-22] 18 (09/07 0921) BP: (127-156)/(78-113) 146/97 (09/07 0921) SpO2:  [94 %-99 %] 94 % (09/07 0921) Weight:  [105.4 kg (232 lb 4.8 oz)] 105.4 kg (232 lb 4.8 oz) (09/07 0046)  CBC:   Recent Labs Lab 05/08/17 1946 05/11/17 1834  WBC 13.0* 12.2*  HGB 16.2 16.4  HCT 48.2 48.7  MCV 88.3 88.9  PLT 225 213    Basic Metabolic Panel:   Recent Labs Lab 05/10/17 1209 05/11/17 1834  NA 136 139  K 5.2 4.5  CL 95* 105  CO2 25 23   GLUCOSE 249* 208*  BUN 28* 33*  CREATININE 1.59* 1.69*  CALCIUM 10.1 9.8    Lipid Panel:     Component Value Date/Time   CHOL 185 05/03/2017 0352   TRIG 178 (H) 05/03/2017 0352   HDL 34 (L) 05/03/2017 0352   CHOLHDL 5.4 05/03/2017 0352   VLDL 36 05/03/2017 0352   LDLCALC 115 (H) 05/03/2017 0352   HgbA1c:  Lab Results  Component Value Date   HGBA1C 11.9 (H) 04/29/2017   Urine Drug Screen:     Component Value Date/Time   LABOPIA NONE DETECTED 04/29/2017 1156   COCAINSCRNUR NONE DETECTED 04/29/2017 1156   LABBENZ NONE DETECTED 04/29/2017 1156   AMPHETMU NONE DETECTED 04/29/2017 1156   THCU NONE DETECTED 04/29/2017 1156   LABBARB NONE DETECTED 04/29/2017 1156    Alcohol Level     Component Value Date/Time   ETH <5 05/11/2017 2257    IMAGING  CTA Head/Neck  05/03/2017 IMPRESSION CTA NECK:1. Mild atherosclerosis without hemodynamically significant stenosis or acute vascular process. CTA HEAD:1. No emergent large vessel occlusion.  2. Severe stenosis RIGHT proximal P2 segment. Moderate stenosis RIGHT P1 segment. 3. Mild cerebral artery luminal irregularity compatible with atherosclerosis.  Carotid US 05/02/2017  Summary: Findings suggest 1-39% internal carotid artery stenosis bilaterally. Vertebral arteries are patent with antegrade flow.  MRI Brain Wo Contrast 05/02/2017 IMPRESSION: 1. 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 2 even smaller  and likely subacute infarcts in the posterior right cerebrum. 2. Mild but premature white matter signal abnormality, patient's medical history suggesting chronic small vessel ischemia.  TTE 05/03/2017 Study Conclusions - Left ventricle: Wall thickness was increased in a pattern of moderate LVH. There was focal basal hypertrophy. Systolic  function was normal. The estimated ejection fraction was in the   range of 50% to 55%. Features are consistent with a pseudonormal left ventricular filling pattern, with  concomitant abnormal relaxation and increased filling pressure (grade 2 diastolic   dysfunction). - Aortic valve: Valve area (Vmax): 3.01 cm^2.  TCD Bubble Study 05/04/2017 Summary: No high intensity transient signals (HITS) were heard at rest or with Valsalva. Therefore, there is no evidence of patent foramen ovale (PFO).  TEE 05/04/2017 Study Conclusions - Left ventricle: Systolic function was normal. The estimated ejection fraction was in the range of 60% to 65%. - Aortic valve: No evidence of vegetation. - Mitral valve: No evidence of vegetation. - Left atrium: No evidence of thrombus in the atrial cavity or appendage. - Atrial septum: No defect or patent foramen ovale was identified. - Tricuspid valve: No evidence of vegetation.    Ct Head Wo Contrast 05/11/2017 IMPRESSION: 1. Low-density in the left basal ganglia consistent with subacute lacunar infarcts, new exams 1 week prior. 2. Expected evolution of subcentimeter left occipital infarct. 3. No intracranial hemorrhage.  MRI Brain Wo Contrast 05/12/2017 Acute/subacute infarct left anterior thalamus and posterior internal capsule as noted on recent CT. 5 mm acute infarct left cingulate gyrus. Negative for hemorrhage.       PHYSICAL EXAM Pleasant Caucasian young male not in distress. . Afebrile. Head is nontraumatic. Neck is supple without bruit.    Cardiac exam no murmur or gallop. Lungs are clear to auscultation. Distal pulses are well felt. Neurological Exam :  Awake and alert oriented to time and place only .nonfluent speech with word finding difficulties and paraphasic errors. Able to name and repeat. Follows two-step commands. Diminished attention, registration and recall. Extraocular movements are full range without nystagmus. Pupils equal and reactive. Fundi not visualized. Blinks to threat bilaterally. Mild right lower facial weakness. Tongue midline. Motor system exam shows no drift but mild right grip weakness with  diminished fine finger movements on the right and orbits left over right upper extremity. Lower extremity symmetric strength, tone and reflexes. Sensation intact. Gait not tested. ASSESSMENT/PLAN Mr. EDEM TIEGS is a 31 y.o. male with history of headache, newly-diagnosed diabetes, hypertension, and recent admission for multifocal ischemic stroke, possibly embolic who has been confused since Monday. He did not receive IV t-PA due to arriving outside of the tPA treatment window.   Stroke: Acute/subacute infarct left anterior thalamus and posterior internal capsule as noted on recent CT. 5 mm acute infarct left cingulate gyrus, secondary to small vessel disease, in the setting of poorly controlled hypertension, previously untreated diabetes, and recent stroke  Resultant  Confusion and speech difficulty  CT head:  Low-density in the left basal ganglia consistent with subacute lacunar infarcts  MRI head: Acute/subacute infarct left anterior thalamus and posterior internal capsule as noted on recent CT. 5 mm acute infarct left cingulate gyrus  MRA head: not performed  CTA (05/03/2017): severe stenosis RIGHT proximal P2 segment, and moderate stenosis RIGHT P1 segment.  2D Echo (05/03/2017): EF 55-60%. No source of embolus   LDL (05/03/2017) 116  HgbA1c (04/29/2017) 11.9  SCDs for VTE prophylaxis Diet heart healthy/carb modified Room service appropriate? Yes; Fluid consistency: Thin  aspirin 325 mg daily and clopidogrel 75 mg daily prior to admission, now on aspirin 325 mg daily and clopidogrel 75 mg daily  Patient counseled to be compliant with his antithrombotic medications  Ongoing aggressive stroke risk factor management  Therapy recommendations:   pending  Disposition:  pending  Hypertension  Stable  Permissive hypertension (OK if < 220/120) but gradually normalize in 5-7 days  Long-term BP goal normotensive  Hyperlipidemia  Home meds: atorvastastin, resumed in  hospital  LDL 116 (on 05/03/2017 when statin was initiated), goal < 70  Continue statin at current maximum dose at discharge  Diabetes  HgbA1c 11.9 (05/03/2017 when insulin was started for new-onset DM), goal < 7.0  Uncontrolled this admission  Diabetic Coordinator consult ordered to optimize insulin regimen  Other Stroke Risk Factors  Obesity, Body mass index is 33.33 kg/m., recommend weight loss, diet and exercise as appropriate   Hx stroke/TIA  Family hx stroke (father)  Migraines   Other [facility] day # 1  I have personally examined this patient, reviewed notes, independently viewed imaging studies, participated in medical decision making and plan of care.ROS completed by me personally and pertinent positives fully documented  I have made any additions or clarifications directly to the above note.He presented with confusion and speech difficulties secondary to left anterior thalamic lacunar infarct from small vessel disease. He has had recent lacunar infarcts 2 weeks ago but had not been taking dual antiplatelet therapy as instructed. Recommend aspirin and Plavix for 3 weeks followed by Plavix alone. No further additional stroke workup is necessary. Greater than 50% time during this 25 minute visit was spent on counseling and coordination of care about his recurrent lacunar infarcts, need for medication compliance and aggressive risk factor modification discussion. Follow up with Dr. Erlinda Hong in 6 weeks. Stroke team will sign off. Kindly call for questions.  Antony Contras, MD Medical Director [place] Pager: [phone] 05/12/2017 8:37 PM   To contact Stroke Continuity provider, please refer to [email or web address] After hours, contact General Neurology

## 2017-05-12 NOTE — Progress Notes (Signed)
    CHIEF COMPLAINT / HPI:  Hospital follow-up for CVA and TIA. He had some symptoms of word finding difficulties again since his return home from the hospital and seen at the emergency department. During that emergency department visit it was determined that he had not been taking his Plavix. He says he just got confused with his medications. He admits to some almost daily intermittent low-grade confusion for many weeks, even prior to his original hospitalization  REVIEW OF SYSTEMS:  Denies chest pain. He's had no headache. No nausea or vomiting. No unusual arthralgias. Denies shortness of breath. No cough. No blurred vision. See history of present illness above  for additional pertinent review of systems.  PERTINENT  PMH / PSH: I have reviewed the patient's medications, allergies, past medical and surgical history, smoking status.  Pertinent findings that relate to today's visit / issues include: Recent hospitalization for TIA/CVA with imaging studies showing: Severe stenosis RIGHT proximal P2 segment. Moderate stenosis RIGHT P1 segment. 3. Mild cerebral artery luminal irregularity compatible with atherosclerosis.   OBJECTIVE:  Vital signs reviewed. GENERAL: Well-developed, well-nourished, no acute distress. CARDIOVASCULAR: Regular rate and rhythm no murmur gallop or rub LUNGS: Clear to auscultation bilaterally, no rales or wheeze. ABDOMEN: Soft positive bowel sounds NEURO: No gross focal neurological deficits. Normal speech fluency in content. Normal gross coordination. MSK: Movement of extremity x 4. PSYCHIATRIC: Alert and oriented 4. Asks and answers questions appropriately.    ASSESSMENT / PLAN: Please see problem oriented charting for details

## 2017-05-12 NOTE — Assessment & Plan Note (Signed)
Has not been checking any of his blood sugars so I encouraged that. No medication changes. Follow-up with PCP 1 week.

## 2017-05-12 NOTE — Evaluation (Signed)
Physical Therapy Evaluation Patient Details Name: Thomas Burch MRN: [id number] DOB: 11/14/1985 Today's Date: 05/12/2017   History of Present Illness  Thomas Burch is a 31 y.o. male presenting with confusion, word finding difficulty. 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 MRI revealed an acute/subacute infarct in the L anterior thalamus and posterior internal capsule as well as a 5mm acute infarct L cingulate.  Clinical Impression  Pt presented supine in bed with HOB elevated, awake and willing to participate in therapy session. Prior to admission, pt reported that he was independent with all functional mobility and ADLs. Pt performed bed mobility and transfers independently. He ambulated in hallway without use of an AD with supervision for safety. Pt also participated in a higher level balance assessment and scored a 22/24 on the DGI indicating that he is a safe community ambulator. No further acute PT needs identified at this time. PT signing off.    Follow Up Recommendations No PT follow up    Equipment Recommendations  None recommended by PT    Recommendations for Other Services       Precautions / Restrictions Precautions Precautions: None Restrictions Weight Bearing Restrictions: No      Mobility  Bed Mobility Overal bed mobility: Independent                Transfers Overall transfer level: Independent                  Ambulation/Gait Ambulation/Gait assistance: Supervision Ambulation Distance (Feet): 300 Feet Assistive device: None Gait Pattern/deviations: WFL(Within Functional Limits) Gait velocity: WFL Gait velocity interpretation: at or above normal speed for age/gender General Gait Details: no instability or LOB  Stairs            Wheelchair Mobility    Modified Rankin (Stroke Patients Only) Modified Rankin (Stroke Patients Only) Pre-Morbid Rankin Score: No symptoms Modified Rankin:  No significant disability     Balance Overall balance assessment: Needs assistance Sitting-balance support: Feet supported Sitting balance-Leahy Scale: Normal Sitting balance - Comments: able to don socks and shoes independently sitting EOB   Standing balance support: No upper extremity supported Standing balance-Leahy Scale: Good                   Standardized Balance Assessment Standardized Balance Assessment : Dynamic Gait Index   Dynamic Gait Index Level Surface: Normal Change in Gait Speed: Normal Gait with Horizontal Head Turns: Normal Gait with Vertical Head Turns: Normal Gait and Pivot Turn: Mild Impairment Step Over Obstacle: Normal Step Around Obstacles: Normal Steps: Mild Impairment Total Score: 22       Pertinent Vitals/Pain Pain Assessment: No/denies pain    Home Living Family/patient expects to be discharged to:: Private residence Living Arrangements: Spouse/significant other;Other relatives Available Help at Discharge: Family Type of Home: House Home Access: Stairs to enter Entrance Stairs-Rails: None Entrance Stairs-Number of Steps: 1 Home Layout: One level Home Equipment: None      Prior Function Level of Independence: Independent               Hand Dominance        Extremity/Trunk Assessment   Upper Extremity Assessment Upper Extremity Assessment: Overall WFL for tasks assessed    Lower Extremity Assessment Lower Extremity Assessment: Overall WFL for tasks assessed    Cervical / Trunk Assessment Cervical / Trunk Assessment: Normal  Communication   Communication: No difficulties  Cognition Arousal/Alertness: Awake/alert Behavior During Therapy:  WFL for tasks assessed/performed Overall Cognitive Status: Within Functional Limits for tasks assessed                                 General Comments: Cognition was [facility] for general conversation, not formally tested.      General Comments      Exercises      Assessment/Plan    PT Assessment Patent does not need any further PT services  PT Problem List         PT Treatment Interventions      PT Goals (Current goals can be found in the Care Plan section)  Acute Rehab PT Goals Patient Stated Goal: return home    Frequency     Barriers to discharge        Co-evaluation               AM-PAC PT "6 Clicks" Daily Activity  Outcome Measure Difficulty turning over in bed (including adjusting bedclothes, sheets and blankets)?: None Difficulty moving from lying on back to sitting on the side of the bed? : None Difficulty sitting down on and standing up from a chair with arms (e.g., wheelchair, bedside commode, etc,.)?: None Help needed moving to and from a bed to chair (including a wheelchair)?: None Help needed walking in hospital room?: None Help needed climbing 3-5 steps with a railing? : None 6 Click Score: 24    End of Session   Activity Tolerance: Patient tolerated treatment well Patient left: in bed;with call bell/phone within reach;with family/visitor present Nurse Communication: Mobility status PT Visit Diagnosis: Other symptoms and signs involving the nervous system (Z61.096(R29.898)    Time: [id number] PT Time Calculation (min) (ACUTE ONLY): 15 min   Charges:   PT Evaluation $PT Eval Moderate Complexity: 1 Mod     PT G Codes:        [place]ramentoJennifer Daylene Vandenbosch, PT, DPT [phone]   Alessandra BevelsJennifer M Shalamar Plourde 05/12/2017, 3:21 PM

## 2017-05-12 NOTE — Discharge Summary (Signed)
Family Medicine Teaching [facility] Discharge Summary  Patient name: Thomas Burch Medical record number: [id number] Date of birth: 10/27/1985 Age: 31 y.o. Gender: male Date of Admission: 05/11/2017  Date of Discharge: 05/13/2017 Admitting Physician: Leighton Roachodd D McDiarmid, MD  Primary Care Provider: Manar Smalling, [place], DO Consultants: Neurology  Indication for Hospitalization: confusion, word finding difficulty  Discharge Diagnoses/Problem List:  Acute Infarct Left cingulate gyrus AKI Dysphagia Embolic stroke New Onset Type II Diabetes Mellitus Hypertension  HFpEF  Disposition: Home  Discharge Condition: Stable  Discharge Exam: See progress note from day of discharge  Brief Hospital Course:  Thomas Krafthillip D Borkenhagen is a 31 y.o. male who presented with word finding difficulties and confusion. It was reported that he was not himself after discharge on 08/30 and that he was getting progressively worse from 09/02.   Recently admitted with AMS and MRI on 08/28 showed a subcentimeteracute infarct in L occipital cortex, small subacute infarcts post R cerebrum, mild small vessel ischemia. Thorough workup for hypercoagulability was done without revealing cause. Event monitor was scheduled for placement 05/19/17. CT: New subacute lacunar infarcts in the left basal ganglia. Evolution of subcentimeter left occipital infarct. No intracranial hemorrhage. MRI brain w/ and w/o on 05/12/17: Acute/subacute infarct left anterior thalamus and posterior internal capsule as noted on recent CT. 5 mm acute infarct left cingulate gyrus. Negative for hemorrhage.  Patient had been noncompliant with plavix when discharged on 08/30. He was encouraged to take at follow up on 09/05 and reportedly did starting that day.   Issues for Follow Up:  1. CVA: Ensure compliance with ASA 325 mg, Plavix 75 mg and atorvastatin 80 mg for recent embolic stroke. Recommended continued SLP services for cognitive communication deficit and  outpatient OT. 2. Needs to follow up for planned cardiac monitor on 9/14 recommended by neurology.  3. AKI: Cr max 2.12 in ED when admitted on 09/05. Function improving off HCTZ and lisinopril to 1.46 at discharge. Recommend follow up Bmet. Holding HCTZ at discharge and reduced home lisinopril to 20mg  daily. Adjust these medications as needed at follow up. 4. Diabetes: Follow up with PCP for new onset diabetes mellitus. Being sent home with insulin pen on 13 U of Lantus daily. Patient may need to start Metformin 500 mg BID based on renal function.  5. Follow up with a diabetes educator. 6. HTN: Patient will need follow up for uncontrolled hypertension. Non-compliant with medications in the past. Sent home with amlodipine 5mg  and lisinopril 20 mg daily. Discontinued clonidine 0.2 mg BID and HCTZ 25 mg daily at discharge. May resume a diuretic if HTN uncontrolled at follow up, consider clonidine if compliant and not at goal. Encourage to decrease sodium in diet.  7. HFpEF: Stable. Patient not requiring Lasix. Continue to monitor fluid status and assess for optical medical management.  Significant Procedures:   Significant Labs and Imaging:   Recent Labs Lab 05/08/17 1946 05/11/17 1834 05/13/17 0407  WBC 13.0* 12.2* 12.8*  HGB 16.2 16.4 15.3  HCT 48.2 48.7 46.4  PLT 225 304 266    Recent Labs Lab 05/08/17 2034 05/10/17 1209 05/11/17 1834 05/12/17 0859 05/13/17 0407  NA 135 136 139 139 138  K 4.4 5.2 4.5 4.2 4.0  CL 102 95* 105 106 101  CO2 23 25 23 26 28   GLUCOSE 205* 249* 208* 267* 155*  BUN 39* 28* 33* 25* 19  CREATININE 2.12* 1.59* 1.69* 1.51* 1.46*  CALCIUM 9.5 10.1 9.8 9.0 9.4  ALKPHOS 74  --  88  --   --   AST 40  --  33  --   --   ALT 77*  --  63  --   --   ALBUMIN 3.9  --  4.2  --   --    Factor V Leiden: negative  Ct Head Wo Contrast  Result Date: 05/11/2017 CLINICAL DATA:  Altered level of consciousness.  Recent stroke. EXAM: CT HEAD WITHOUT CONTRAST TECHNIQUE:  Contiguous axial images were obtained from the base of the skull through the vertex without intravenous contrast. COMPARISON:  Head CT 05/03/2017.  Brain MRI 05/02/2017 FINDINGS: Brain: No evidence of hemorrhage, hydrocephalus, extra-axial collection or mass lesion/mass effect. There is new low density in the left basal ganglia from prior exam consistent with interval ischemia. Expected encephalomalacia in subcentimeter infarct in the left occipital lobe since prior. Mild chronic small vessel ischemia. Stable brain volume with borderline atrophy for age. Vascular: No hyperdense vessel or unexpected calcification. Skull: Normal. Negative for fracture or focal lesion. Sinuses/Orbits: Paranasal sinuses and mastoid air cells are clear. The visualized orbits are unremarkable. Other: None. IMPRESSION: 1. Low-density in the left basal ganglia consistent with subacute lacunar infarcts, new exams 1 week prior. 2. Expected evolution of subcentimeter left occipital infarct. 3. No intracranial hemorrhage. Electronically Signed   By: Rubye Oaks M.D.   On: 05/11/2017 21:35   Mr Laqueta Jean WU Contrast  Result Date: 05/12/2017 CLINICAL DATA:  Stroke EXAM: MRI HEAD WITHOUT AND WITH CONTRAST TECHNIQUE: Multiplanar, multiecho pulse sequences of the brain and surrounding structures were obtained without and with intravenous contrast. CONTRAST:  20 mL MultiHance IV COMPARISON:  CT 05/11/2017, MRI 04/22/2017 FINDINGS: Brain: Restricted diffusion in the left anterior thalamus and posterior limb internal capsule compatible with acute/ subacute infarct. 5 mm restricted diffusion in the cingulate gyrus on the left just above the lateral ventricle. These were not present on the prior MRI. Resolution of small areas of infarction left occipital lobe and right parietal white matter since the prior MRI. Negative for hemorrhage or mass. Negative for hydrocephalus. Normal enhancement postcontrast infusion. Vascular: Normal arterial flow  voids.  Normal venous enhancement. Skull and upper cervical spine: Negative Sinuses/Orbits: Negative Other: None IMPRESSION: Acute/subacute infarct left anterior thalamus and posterior internal capsule as noted on recent CT. 5 mm acute infarct left cingulate gyrus. Negative for hemorrhage. Electronically Signed   By: Marlan Palau M.D.   On: 05/12/2017 12:10    Results/Tests Pending at Time of Discharge:   Discharge Medications:  Allergies as of 05/13/2017   No Known Allergies     Medication List    STOP taking these medications   cloNIDine 0.2 MG tablet Commonly known as:  CATAPRES   hydrochlorothiazide 25 MG tablet Commonly known as:  HYDRODIURIL     TAKE these medications   amLODipine 5 MG tablet Commonly known as:  NORVASC Take 1 tablet (5 mg total) by mouth daily.   aspirin 325 MG EC tablet Take 1 tablet (325 mg total) by mouth daily.   atorvastatin 80 MG tablet Commonly known as:  LIPITOR Take 1 tablet (80 mg total) by mouth daily at 6 PM.   clopidogrel 75 MG tablet Commonly known as:  PLAVIX Take 1 tablet (75 mg total) by mouth daily.   Insulin Glargine 100 UNIT/ML Solostar Pen Commonly known as:  LANTUS Inject 13 Units into the skin every morning.   lisinopril 40 MG tablet Commonly known as:  PRINIVIL,ZESTRIL Take 0.5 tablets (20 mg total)  by mouth daily. What changed:  how much to take            Discharge Care Instructions        Start     Ordered   05/13/17 0000  lisinopril (PRINIVIL,ZESTRIL) 40 MG tablet  Daily     05/13/17 1300   05/13/17 0000  Increase activity slowly     05/13/17 1300   05/13/17 0000  Diet - low sodium heart healthy     05/13/17 1300   05/13/17 0000  amLODipine (NORVASC) 5 MG tablet  Daily     05/13/17 1300   05/13/17 0000  Discharge instructions    Comments:  I recommend several medication changes to better reduce your risk of another stroke and also make sure your heart and kidneys remain healthy.  You can discontinue  taking the hydrochlorothiazide (HCTZ) until seen in the clinic and kidney function is checked. Take your lisinopril at a reduced dose of , 1/2 of your previous dose. Start taking the medicine amlodipine  ONCE daily for blood pressure INSTEAD OF the clonidine TWICE daily. This prescription is sent to your pharmacy.  You will need to take aspirin and plavix both daily for the next 3 weeks, after that time you should take only plavix daily.  [facility] on 9/17 unless you need medical help sooner. This visit is important as they will likely need to adjust some medication doses and make sure kidney function is normal.   05/13/17 1300   05/13/17 0000  Ambulatory referral to Occupational Therapy    Comments:  Post-stroke rehab   05/13/17 1300   05/13/17 0000  Ambulatory referral to Speech Therapy    Comments:  Post-stroke rehab   05/13/17 1300      Discharge Instructions: Please refer to Patient Instructions section of EMR for full details.  Patient was counseled important signs and symptoms that should prompt return to medical care, changes in medications, dietary instructions, activity restrictions, and follow up appointments.   Follow-Up Appointments: Follow-up Information    [facility] Follow [facility].   Specialty:  Rehabilitation Why:  They should call by Thursday to set up first appointment. Contact information: [address] [id number] mc [place] [address] [phone]        PCP on 9/17 at 1:45 pm.  Jenah Vanasten, [place], DO 05/15/2017, 9:44 AM PGY-1, [facility] Internal Medicine

## 2017-05-12 NOTE — Progress Notes (Signed)
Patient administered 1 mg ativan IV per order, RH PIV S/L for transport. Patient leaving unit for imaging.

## 2017-05-12 NOTE — Evaluation (Signed)
l Speech Language Pathology Evaluation Patient Details Name: Thomas Burch MRN: [id number] DOB: 01/29/1986 Today's Date: 05/12/2017 Time: [id number] SLP Time Calculation (min) (ACUTE ONLY): 14 min  Problem List:  Patient Active Problem List   Diagnosis Date Noted  . Dysphasia 05/12/2017  . Acute kidney injury (HCC) 05/12/2017  . Stroke (HCC) 05/11/2017  . Diabetes mellitus (HCC)   . Hyperlipidemia   . CVA (cerebral vascular accident) (HCC) 05/02/2017  . Hypertensive urgency   . Hypertension 02/01/2014  . Obesity, Class II, BMI 35-39.9, with comorbidity 02/01/2014   Past Medical History:  Past Medical History:  Diagnosis Date  . Headache   . Hypertension   . Stroke [facility](HCC)    Past Surgical History:  Past Surgical History:  Procedure Laterality Date  . TEE WITHOUT CARDIOVERSION N/A 05/04/2017   Procedure: TRANSESOPHAGEAL ECHOCARDIOGRAM (TEE);  Surgeon: Elease HashimotoNahser, Deloris PingPhilip J, MD;  [facility];  Service: Cardiovascular;  Laterality: N/A;   HPI:  31 y.o.malepresenting with confusion, word finding difficulty. 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  MRI from 8/28- Subcentimeter acute infarct in the left occipital cortex. 2 even smaller and likely subacute infarcts in the posterior right cerebrum.   Assessment / Plan / Recommendation Clinical Impression  Pt presents with normal speech/language, but new short-term memory deficts that he describes as starting last week.  Verbal word list - recalled 0/5 target words.  Poor prospective recall.  SLP will follow acutely - given youth and responsibility of work, pt may benefit from f/u SLP to address memory deficits.      SLP Assessment  SLP Recommendation/Assessment: Patient needs continued Speech Lanaguage Pathology Services SLP Visit Diagnosis: Cognitive communication deficit (R41.841)    Follow Up Recommendations  Other (comment) (tba)    Frequency and Duration min 1 x/week   1 week      SLP Evaluation Cognition  Overall Cognitive Status: Impaired/Different from baseline Arousal/Alertness: Awake/alert Orientation Level: Oriented X4 Attention: Sustained Sustained Attention: Appears intact Memory: Impaired Memory Impairment: Retrieval deficit Awareness: Appears intact Problem Solving: Appears intact Safety/Judgment: Appears intact       Comprehension  Auditory Comprehension Overall Auditory Comprehension: Appears within functional limits for tasks assessed Visual Recognition/Discrimination Discrimination: Within Function Limits Reading Comprehension Reading Status: Not tested    Expression Expression Primary Mode of Expression: Verbal Verbal Expression Overall Verbal Expression: Appears within functional limits for tasks assessed   Oral / Motor  Oral Motor/Sensory Function Overall Oral Motor/Sensory Function: Within functional limits Motor Speech Overall Motor Speech: Appears within functional limits for tasks assessed   GO                    Blenda MountsCouture, Thomas Burch 05/12/2017, 10:09 AM

## 2017-05-12 NOTE — Progress Notes (Signed)
PT Cancellation Note  Patient Details Name: Thomas Burch MRN: [id number] DOB: 12/16/1985   Cancelled Treatment:    Reason Eval/Treat Not Completed: Patient at procedure or test/unavailable. Pt off of the floor for an MRI. PT will continue to f/u with pt as available.   Alessandra BevelsJennifer M Yesha Muchow 05/12/2017, 11:14 AM

## 2017-05-12 NOTE — Consult Note (Signed)
Neurology Consultation Reason for Consult: Ischemic stroke Referring Physician: [place], P  CC: Confusion  History is obtained from: Patient  HPI: Thomas Burch is a 31 y.o. male with a recent admission for multifocal ischemic stroke, possibly embolic who has been confused since Monday. At the time he went to the emergency department, but felt like his symptoms had resolved and was nonfocal on exam. Since that time, however, his friend states that he has been waxing/waning and confusion and making odd statements at times, such as while they were in the car he said "are you taking me to the cardio train?"  He denies headaches.  He was just admitted with an extensive workup including CSF (WBC 0 RBC 0 protein 24 glucose 166).  HIV-negative Hyper coag panel-negative so far TSH-normal CRP-1.1 ESR-3   LKW: Monday tpa given?: no, outside of window   ROS: A 14 point ROS was performed and is negative except as noted in the HPI.   Past Medical History:  Diagnosis Date  . Headache   . Hypertension   . Stroke [facility])      Family History  Problem Relation Age of Onset  . Hypertension Father   . Stroke Father   . Hyperlipidemia Father   . Diabetes Maternal Grandmother      Social History:  reports that he has never smoked. He has never used smokeless tobacco. He reports that he does not drink alcohol or use drugs.   Exam: Current vital signs: BP (!) 145/91 (BP Location: Left Arm)   Pulse 86   Temp 98.4 F (36.9 C) (Oral)   Resp 20   Ht _0  (1.778 m)   Wt 105.4 kg (232 lb 4.8 oz)   SpO2 97%   BMI 33.33 kg/m  Vital signs in last 24 hours: Temp:  [98.4 F (36.9 C)-98.5 F (36.9 C)] 98.4 F (36.9 C) (09/07 0046) Pulse Rate:  [77-99] 86 (09/07 0046) Resp:  [11-22] 20 (09/07 0046) BP: (127-155)/(78-103) 145/91 (09/07 0046) SpO2:  [94 %-99 %] 97 % (09/07 0046) Weight:  [105.4 kg (232 lb 4.8 oz)] 105.4 kg (232 lb 4.8 oz) (09/07 0046)   Physical Exam   Constitutional: Appears well-developed and well-nourished.  Psych: Affect appropriate to situation Eyes: No scleral injection HENT: No OP obstrucion Head: Normocephalic.  Cardiovascular: Normal rate and regular rhythm.  Respiratory: Effort normal and breath sounds normal to anterior ascultation GI: Soft.  No distension. There is no tenderness.  Skin: WDI  Neuro: Mental Status: Patient is awake, alert, oriented to person, place, month, year, and situation. Patient is able to give a clear and coherent history. No signs of aphasia or neglect Cranial Nerves: II: Visual Fields are full. Pupils are equal, round, and reactive to light.   III,IV, VI: EOMI without ptosis or diploplia.  V: Facial sensation is symmetric to temperature VII: Facial movement is symmetric.  VIII: hearing is intact to voice X: Uvula elevates symmetrically XI: Shoulder shrug is symmetric. XII: tongue is midline without atrophy or fasciculations.  Motor: Tone is normal. Bulk is normal. 5/5 strength was present in all four extremities.  Sensory: Sensation is symmetric to light touch and temperature in the arms and legs. Cerebellar: FNF and HKS are intact bilaterally      I have reviewed labs in epic and the results pertinent to this consultation are: Hyper coag panel-unremarkable with the exception of factor V Leiden which is pending ESR-normal CRP-borderline at 1.1(upper limit of normal is 1.0) CSF  I have reviewed the images obtained: CT head-left thalamic infarct that was not there previously  Impression: 31 year old male with recurrent infarcts. He does not have any headaches or imaging or CSF abnormalities to suggest a vasculitis. I would favor getting a MRI and if it does show that he has had new infarcts, a formal angiogram may be required.  Recommendations: 1) MRI brain w/wo contrast 2) possible consideration of angiogram 3) continue dual antiplatelet therapy 4) frequent neuro checks 5) PT,  OT  6) stroke team to follow   Roland Rack, MD [facility] [phone]  If 7pm- 7am, please page neurology on call as listed in [place].

## 2017-05-12 NOTE — Assessment & Plan Note (Signed)
Absolutely imperative that he continues on DAPT and we discussed at length. I reiterated the extremely high risk he is having another stroke. Sounds like he did have recurrence of his TIA symptoms. Unclear if his confusion is related to TIA or to hyperglycemia.I made him an appointment within the next week with his PCP

## 2017-05-12 NOTE — Progress Notes (Signed)
Inpatient Diabetes Program Recommendations  AACE/ADA: New Consensus Statement on Inpatient Glycemic Control (2015)  Target Ranges:  Prepandial:   less than 140 mg/dL      Peak postprandial:   less than 180 mg/dL (1-2 hours)      Critically ill patients:  140 - 180 mg/dL   Lab Results  Component Value Date   GLUCAP 266 (H) 05/12/2017   HGBA1C 11.9 (H) 04/29/2017    Review of Glycemic Control  Diabetes history: DM 2 new diagnosis couple weeks ago  Inpatient Diabetes Program Recommendations:    Glucose 200's. Increase patient's Lantus to home dose 13 units (he was on this last admission). Glucose per patient and family have been within goal discussed last admission.  Lunchtime glucose not showing up. Patient reports doing well with diet. Patient has lost 14 pounds since last admission.   Thanks,  Christena DeemShannon Dyon Rotert RN, MSN, [facility] Inpatient Diabetes Coordinator Team Pager [phone] (8a-5p)

## 2017-05-13 DIAGNOSIS — I6340 Cerebral infarction due to embolism of unspecified cerebral artery: Principal | ICD-10-CM

## 2017-05-13 LAB — BASIC METABOLIC PANEL
ANION GAP: 9 (ref 5–15)
BUN: 19 mg/dL (ref 6–20)
CALCIUM: 9.4 mg/dL (ref 8.9–10.3)
CO2: 28 mmol/L (ref 22–32)
Chloride: 101 mmol/L (ref 101–111)
Creatinine, Ser: 1.46 mg/dL — ABNORMAL HIGH (ref 0.61–1.24)
Glucose, Bld: 155 mg/dL — ABNORMAL HIGH (ref 65–99)
POTASSIUM: 4 mmol/L (ref 3.5–5.1)
Sodium: 138 mmol/L (ref 135–145)

## 2017-05-13 LAB — GLUCOSE, CAPILLARY
Glucose-Capillary: 149 mg/dL — ABNORMAL HIGH (ref 65–99)
Glucose-Capillary: 236 mg/dL — ABNORMAL HIGH (ref 65–99)

## 2017-05-13 LAB — CBC
HCT: 46.4 % (ref 39.0–52.0)
Hemoglobin: 15.3 g/dL (ref 13.0–17.0)
MCH: 29.4 pg (ref 26.0–34.0)
MCHC: 33 g/dL (ref 30.0–36.0)
MCV: 89.1 fL (ref 78.0–100.0)
Platelets: 266 K/uL (ref 150–400)
RBC: 5.21 MIL/uL (ref 4.22–5.81)
RDW: 12.7 % (ref 11.5–15.5)
WBC: 12.8 K/uL — ABNORMAL HIGH (ref 4.0–10.5)

## 2017-05-13 LAB — HEPATITIS C ANTIBODY: HCV Ab: <0.1 {s_co_ratio} (ref 0.0–0.9)

## 2017-05-13 MED ORDER — LISINOPRIL 40 MG PO TABS: 20.0000 mg | ORAL_TABLET | Freq: Every day | ORAL | 0 refills | Status: DC

## 2017-05-13 MED ORDER — AMLODIPINE BESYLATE 5 MG PO TABS: 5.0000 mg | ORAL_TABLET | Freq: Every day | ORAL | 1 refills | Status: DC

## 2017-05-13 NOTE — Progress Notes (Signed)
Patient's blood pressure elevated at 164/116. On-call Md notified. Md advised to allow for premissive hypertension. Will continue to monitor.

## 2017-05-13 NOTE — Progress Notes (Signed)
Family Medicine Teaching Service Daily Progress Note Intern Pager: [phone]  Patient name: Thomas Burch Medical record number: [id number] Date of birth: 10/09/1985 Age: 31 y.o. Gender: male  Primary Care Provider: Shirley, [place], DO Consultants: Neurology Code Status: FULL CODE  Pt Overview and Major Events to Date:  Thomas Burch a 31 y.o.malepresenting with confusion, word finding difficulty. PMH is significant for embolic stroke with recent admission 04/29/17, new DM2 diagnosed at that time, HTN, HFpEF.   Assessment and Plan:  Acute Infarct Left cingulate gyrus: Mild expressive dysphasia was reported previously, but his speech is appropriate and normal today. MRI brain w/ and w/o demonstrated acute/subacute infarct left anterior thalamus and posterior internal capsule as noted on recent CT as well as 5 mm acute infarct left cingulate gyrus. There was no hemorrhage detected. - He will follow up for planned cardiac monitor 9/14 - He has several risk factors in HTN and T2DM  - No additional inpatient workup is needed given recent full evaluation, stroke team recommendations appreciated - We will recommend outpatient OT and SLP follow up for possible mild cognitive difficulties with returning to full working status  AKI: Cr max 2.12 in ED when admitted on 09/05 with a history of poor PO intake and he wasand given 1 L bolus. Function is improving off HCTZ and lisinopril to 1.46 today. -We will hold HCTZ at discharge and reduce home lisinopril to 20mg  daily - F/U outpatient for Bmet and management  DM2: Hgba1c 11.9 in recent admission. Discharged on Lantus 13U QD. Requiring 15 units TDD past 24 hours. -No change to regimen at this time, can start metformin if renal fxn normalizes.  HTN: hx of noncompliance. Recently discharged with clonidine 0.2mg  BID, lisinopril 40mg  daily, HCTZ 25mg  daily. He may have experienced excessive diuresis from hyperglycemia and HCTZ. I think a CCB could  be a better and easier to comply with therapy than BID clonidine. - Will discharge with amlodipine, lisinopril - Can resume a diuretic if HTN uncontrolled at follow up, consider clonidine if compliant and not at goal  FEN/GI: HH/ carb modified diet Prophylaxis: SCDs Disposition: to home today  Subjective:  Thomas Burch is feeling well this morning. His speech seems to be at about baseline and he is not noticing any specific complaints. Participating with therapy mostly notes just slight slowing without any unintelligible speech.  Objective: Temp:  [97.9 F (36.6 C)-99.1 F (37.3 C)] 97.9 F (36.6 C) (09/08 0500) Pulse Rate:  [81-97] 84 (09/08 0500) Resp:  [16-18] 18 (09/08 0500) BP: (135-164)/(95-116) 135/95 (09/08 0500) SpO2:  [95 %-98 %] 98 % (09/08 0500) Physical Exam: General: Awake, alert, resting in bed in no acute distress Cardiovascular: RRR, no murmurs Respiratory: CTAB with normal WOB Extremities: No peripheral edema, no deformity Neuro: No facial asymmetry, normal speech, sensation intact throughout, 5/5 strength in upper and lower extremities  Laboratory:  Recent Labs Lab 05/08/17 1946 05/11/17 1834 05/13/17 0407  WBC 13.0* 12.2* 12.8*  HGB 16.2 16.4 15.3  HCT 48.2 48.7 46.4  PLT 225 304 266    Recent Labs Lab 05/08/17 2034  05/11/17 1834 05/12/17 0859 05/13/17 0407  NA 135  < > 139 139 138  K 4.4  < > 4.5 4.2 4.0  CL 102  < > 105 106 101  CO2 23  < > 23 26 28   BUN 39*  < > 33* 25* 19  CREATININE 2.12*  < > 1.69* 1.51* 1.46*  CALCIUM 9.5  < > 9.8  9.0 9.4  PROT 6.9  --  7.8  --   --   BILITOT 0.7  --  0.9  --   --   ALKPHOS 74  --  88  --   --   ALT 77*  --  63  --   --   AST 40  --  33  --   --   GLUCOSE 205*  < > 208* 267* 155*  < > = values in this interval not displayed.   Imaging/Diagnostic Tests: 05/11/17 CT HEAD WITHOUT CONTRAST IMPRESSION: 1. Low-density in the left basal ganglia consistent with subacute lacunar infarcts, new exams 1  week prior. 2. Expected evolution of subcentimeter left occipital infarct. 3. No intracranial hemorrhage.  05/12/17 MRI HEAD WITHOUT AND WITH CONTRAST IMPRESSION: Acute/subacute infarct left anterior thalamus and posterior internal capsule as noted on recent CT. 5 mm acute infarct left cingulate gyrus. Negative for hemorrhage.  Fuller Plan, MD 05/13/2017, 1:22 PM PGY-3, [facility] Internal Medicine FPTS Intern pager: [phone], text pages welcome

## 2017-05-13 NOTE — Progress Notes (Signed)
Occupational Therapy Evaluation Patient Details Name: Thomas Burch MRN: [id number] DOB: Aug 17, 1986 Today's Date: 05/13/2017    History of Present Illness Thomas Burch is a 31 y.o. male presenting with confusion, word finding difficulty. 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 MRI revealed an acute/subacute infarct in the L anterior thalamus and posterior internal capsule as well as a 5mm acute infarct L cingulate.   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 Pt is currently independent for ADL and mobility but presents with memory deficits and executive functioning deficits. Pt will benefit from skilled OT in the acute setting to provide cognitive compensatory strategies towards higher level thinking and management of everyday tasks. Pt will also require OPOT to maximize safety and independence. Next session to take Pt out of room and look at navigation of hospital/problem solving skills in unfamiliar environment.     Follow Up Recommendations  Outpatient OT;Supervision - Intermittent    Equipment Recommendations  None recommended by OT    Recommendations for Other Services       Precautions / Restrictions Precautions Precautions: None Restrictions Weight Bearing Restrictions: No      Mobility Bed Mobility Overal bed mobility: Independent                Transfers Overall transfer level: Independent                    Balance Overall balance assessment: Needs assistance Sitting-balance support: Feet supported Sitting balance-Leahy Scale: Normal Sitting balance - Comments: able to don socks and shoes independently sitting EOB   Standing balance support: No upper extremity supported Standing balance-Leahy Scale: Good Standing balance comment: able to perform various transfers in addition to static standing with no UE  support                           ADL either performed or assessed with clinical judgement   ADL Overall ADL's : Modified independent                                       General ADL Comments: able to don/doff socks sitting EOB, perform toilet transfer, sink level grooming (although required vc for soap with handwashing)     Vision Baseline Vision/History: Wears glasses Wears Glasses: At all times Patient Visual Report: No change from baseline Vision Assessment?: No apparent visual deficits     Perception     Praxis      Pertinent Vitals/Pain Pain Assessment: No/denies pain     Hand Dominance Right   Extremity/Trunk Assessment Upper Extremity Assessment Upper Extremity Assessment: Overall WFL for tasks assessed   Lower Extremity Assessment Lower Extremity Assessment: Overall WFL for tasks assessed   Cervical / Trunk Assessment Cervical / Trunk Assessment: Normal   Communication Communication Communication: No difficulties   Cognition Arousal/Alertness: Awake/alert Behavior During Therapy: WFL for tasks assessed/performed Overall Cognitive Status: Impaired/Different from baseline Area of Impairment: Memory                     Memory: Decreased short-term memory         General Comments: Pt required verbal cues for 3 step functional task/sequencing   General Comments  HR spiked to 130's during session,  after sitting back in bed at EOS for approx 3-5 min was back at 110-115    Exercises     Shoulder Instructions      Home Living Family/patient expects to be discharged to:: Private residence Living Arrangements: Other relatives Database administrator(Grandmother) Available Help at Discharge: Family Type of Home: House Home Access: Stairs to enter Secretary/administratorntrance Stairs-Number of Steps: 1 Entrance Stairs-Rails: None Home Layout: One level     Bathroom Shower/Tub: Chief Strategy OfficerTub/shower unit   Bathroom Toilet: Standard     Home Equipment: None       Lives With: Other (Comment) (elderly grandmother)    Prior Functioning/Environment Level of Independence: Independent        Comments: 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        OT Problem List: Decreased cognition      OT Treatment/Interventions: Therapeutic activities;Cognitive remediation/compensation;Patient/family education    OT Goals(Current goals can be found in the care plan section) Acute Rehab OT Goals Patient Stated Goal: return home OT Goal Formulation: With patient Time For Goal Achievement: 05/27/17 Potential to Achieve Goals: Good ADL Goals Pt Will Perform Grooming: Independently;standing (sequencing 3 tasks as requested by therapist with no vc) Additional ADL Goal #1: Pt will recall 3 compensatory strategies for executive level cognition Additional ADL Goal #2: Pt will navigate the hospital using contextual clues and problem solving skills with less than 2 verbal cues from therapist  OT Frequency: Min 2X/week   Barriers to D/C:            Co-evaluation              AM-PAC PT "6 Clicks" Daily Activity     Outcome Measure Help from another person eating meals?: None Help from another person taking care of personal grooming?: None Help from another person toileting, which includes using toliet, bedpan, or urinal?: None Help from another person bathing (including washing, rinsing, drying)?: None Help from another person to put on and taking off regular upper body clothing?: None Help from another person to put on and taking off regular lower body clothing?: None 6 Click Score: 24   End of Session Nurse Communication: Mobility status;Other (comment) (watch HR)  Activity Tolerance: Patient tolerated treatment well Patient left: in bed;with call bell/phone within reach  OT Visit Diagnosis: Other symptoms and signs involving cognitive function                Time: 1010-1028 OT Time Calculation (min): 18  min Charges:  OT General Charges $OT Visit: 1 Visit OT Evaluation $OT Eval Low Complexity: 1 Low G-Codes:     Sherryl MangesLaura Makaela Burch OTR/L [phone]  Thomas Burch 05/13/2017, 12:20 PM

## 2017-05-13 NOTE — Progress Notes (Signed)
Patient ready for discharge to home; discharge instructions given and reviewed; Rx's sent electronically; Patient ambulated out for discharge; all belongings returned.

## 2017-05-15 ENCOUNTER — Encounter: Payer: Self-pay | Admitting: Family Medicine

## 2017-05-15 ENCOUNTER — Telehealth: Payer: Self-pay | Admitting: *Deleted

## 2017-05-15 NOTE — Telephone Encounter (Signed)
Patient called requesting to review his medication list. Patient wanted to make sure he is taking all his medications correctly.  Reviewed medication list with patient. Patient voiced when and how much of each medication to take.  Patient has an upcoming appointment with PCP 05/22/17.  Clovis PuMartin, Bubber Rothert L, RN

## 2017-05-19 ENCOUNTER — Ambulatory Visit (INDEPENDENT_AMBULATORY_CARE_PROVIDER_SITE_OTHER): Payer: Commercial Managed Care - PPO

## 2017-05-19 ENCOUNTER — Other Ambulatory Visit: Payer: Self-pay | Admitting: *Deleted

## 2017-05-19 ENCOUNTER — Encounter: Payer: Self-pay | Admitting: *Deleted

## 2017-05-19 DIAGNOSIS — I639 Cerebral infarction, unspecified: Secondary | ICD-10-CM

## 2017-05-19 NOTE — Patient Outreach (Addendum)
[facility]) Care Management  05/19/2017  Thomas Burch May 23, 1986 [id number]  EMMI- Stroke RED ON EMMI ALERT DAY#: 13 DATE: 05/18/17 RED ALERT: Thomas Burch to follow-up appointment? No   Outreach attempt to patient and HIPPA verified. Patient stated, he was not sure which appointment the automated phone call was referring to. He has an appointment scheduled for 05/19/17 to place a Holter monitor. His hospital discharge appointment is scheduled for 05/22/17. Patient was unable to provide pertinent information related to a stroke and/or DM. Patient agreed to receive educational materials. Patient received and understood his discharge paperwork from the hospital. Patient was given several educational Stroke articles with his discharge paperwork from the hospital. He was able to afford his discharge medications. He verbalized taking his medications as prescribed, including his Aspirin and Plavix. Patient stated, he declined home health, because "he did not need it". Per EMR, patient will have outpatient therapy. He reported, he adheres to his carbohydrate modified diet. He monitors his BG 1 - 2 times per day. His BG reading on 05/18/17 was around 190. He plans to check his BG today. RN CM Thomas Burch) explained to patient the importance of monitoring his BG. He was unable to provide information regarding a target BG level. RN CM Thomas Burch) encouraged patient to ask his primary MD about his target hemoglobin A1C and BG range.  Patient informed primary MD may want him to log his BG readings and/or have his glucometer available during his office visit.    Plan RN CM will send [facility] EMMI educational materials to patient. RN CM will notify Thomas Burch regarding case closure. RN CM advised patient to contact RN CM for any needs or concerns.  Thomas Cleveland, RN, BSN, MHA/MSL, [facility] Telephonic Care Manager Coordinator [facility] Direct Phone: [phone] Toll Free:  [phone] Fax: [phone]

## 2017-05-22 ENCOUNTER — Encounter: Payer: Self-pay | Admitting: Family Medicine

## 2017-05-22 ENCOUNTER — Ambulatory Visit (INDEPENDENT_AMBULATORY_CARE_PROVIDER_SITE_OTHER): Payer: Commercial Managed Care - PPO | Admitting: Family Medicine

## 2017-05-22 VITALS — BP 122/70 | HR 130 | Temp 98.6°F | Ht 70.0 in | Wt 234.0 lb

## 2017-05-22 DIAGNOSIS — N179 Acute kidney failure, unspecified: Secondary | ICD-10-CM | POA: Diagnosis not present

## 2017-05-22 DIAGNOSIS — I6340 Cerebral infarction due to embolism of unspecified cerebral artery: Secondary | ICD-10-CM | POA: Diagnosis not present

## 2017-05-22 DIAGNOSIS — I10 Essential (primary) hypertension: Secondary | ICD-10-CM | POA: Diagnosis not present

## 2017-05-22 DIAGNOSIS — E785 Hyperlipidemia, unspecified: Secondary | ICD-10-CM

## 2017-05-22 DIAGNOSIS — E119 Type 2 diabetes mellitus without complications: Secondary | ICD-10-CM | POA: Diagnosis not present

## 2017-05-22 DIAGNOSIS — I639 Cerebral infarction, unspecified: Secondary | ICD-10-CM

## 2017-05-22 MED ORDER — METFORMIN HCL 1000 MG PO TABS: 500.0000 mg | ORAL_TABLET | Freq: Every day | ORAL | 3 refills | Status: DC

## 2017-05-22 NOTE — Assessment & Plan Note (Deleted)
Patient to continue his atorvastatin.

## 2017-05-22 NOTE — Assessment & Plan Note (Signed)
Patient reporting no new symptoms and says he has noticed improvements in his word searching and pronunciation, however reports that he is not at his baseline. Speech recommended outpatient follow up, I have made a referral. He thinks he would benefit from this.

## 2017-05-22 NOTE — Assessment & Plan Note (Signed)
Patient was normotensive on visit today. Reports compliance with Clonidine, Lisinopril, and Norvasc. Discontinued HCTZ after discharge on 05/13/2017. Will continue to monitor. Patient also reporting significant diet change with decreased sodium intake and decreased fried foods.

## 2017-05-22 NOTE — Assessment & Plan Note (Addendum)
Patient reports that he is using his Lantus 13 U daily and monitoring his sugars in the morning. He indicates that it is running between 180-220 most days. Will start him on Metformin 500 mg daily for one week, and if tolerated, he will increase to 500 mg twice per day. Will follow up in 4-6 weeks.

## 2017-05-22 NOTE — Patient Instructions (Addendum)
It was a pleasure to see you today! Thank you for choosing [facility] for your primary care. Thomas Burch was seen for hospital follow up for management of new diabetes diagnosis and blood pressure control.   Our plans for today were:  Continue taking Aspirin 325 mg and Plavix 75 mg daily to prevent another stroke.  Start metformin 500 mg daily with a meal for one week. If this is well tolerated, increase dosing to 500 mg twice per day with meals. This drug is important for diabetes management.   Continue taking Norvasc, lisinopril, and clonidine for your blood pressure.  Continue taking atorvastatin. This is important for prevention and cholesterol management.   Continue working on improving your diet. You're doing great!  I have made a referral for speech and occupational therapy.   You should return to our clinic to see me in 4-6 weeks for diabetes management and a blood pressure check, please call and schedule an appintment.   If you experience any stroke-like symptoms, such as weakness, numbness, or speech changes, please do not hesitate to go to the hospital.   [facility]: [phone]  Best,  Dr. Talbert Forest

## 2017-05-22 NOTE — Assessment & Plan Note (Addendum)
Obtaining BMP for follow up on AKI while admitted 09/08.

## 2017-05-22 NOTE — Progress Notes (Signed)
Subjective:    Patient ID: Thomas Burch, male    DOB: 01-08-1986, 31 y.o.   MRN: [id number]   CC: Hospital Follow up  HPI:  CVA Patient reporting noncompliance with ASA 325 mg and Plavix 75 mg. Counseled on importance of taking these to prevent further strokes.  - Compliant withatorvastatin 80 mg.  - Patient desires continued SLP services for cognitive communication deficit and outpatient OT. - Reports no new symptoms and improvement of symptoms from discharge.  DM2 Hgba1c 11.9 in recent admission. Discharged on Lantus 13U QD and metformin.  - Patient reports checking his sugars once most days - 180-220's every am.  - Reports compliance with Lantus 13 U daily - Attempting to change diet, and was given information on how to log food and check carb content - No symptoms of hypoglycemic episodes  HTN: hx of noncompliance. Recently discharged with clonidine 0.2mg  BID, lisinopril  daily, and Norvasc 5 mg. - 122/70 today in the office. - Reports compliance with new medications  AKI Cr max 2.12 in ED when patient admitted due to poor PO intake on 09/05 and given 1 L bolus. Cr 1.46 on 9/8.  - Not on home HCTZ - Taking reduced home lisinopril dosage of  daily that was changed on discharge on 09/08 - Patient reports good intake   Smoking status reviewed  Review of Systems  Per HPI, else denies recent illness, headache, changes in vision, changes in speech, chest pain, shortness of breath, abdominal pain, weakness  Patient declined flu and pneumococcal vaccine this appointment. He would like to get it next appointment in 4-6 weeks.   Patient Active Problem List   Diagnosis Date Noted  . Dysphasia 05/12/2017  . Acute kidney injury (HCC) 05/12/2017  . H/O cerebral artery stenosis 05/12/2017  . Stroke (HCC) 05/11/2017  . Diabetes mellitus (HCC)   . Hyperlipidemia   . CVA (cerebral vascular accident) (HCC) 05/02/2017  . Hypertension 02/01/2014  . Obesity, Class II,  BMI 35-39.9, with comorbidity 02/01/2014     Objective:  BP 122/70   Pulse (!) 130   Temp 98.6 F (37 C) (Oral)   Ht  (1.778 m)   Wt 234 lb (106.1 kg)   SpO2 95%   BMI 33.58 kg/m  Vitals and nursing note reviewed  General: NAD, pleasant Cardiac: regular rhythm, increased rate, normal heart sounds, no murmurs.  Respiratory: CTAB, normal effort Abdomen: soft, nontender. Bowel sounds present Extremities: no edema or cyanosis.  Skin: warm and dry, no rashes noted Neuro: alert and oriented, no focal deficits noted. CN II-XII grossly intact. Speech clear. EOMI, Uvula and tongue midline. Facial movements symmetric. 5/5 strength in the upper extremities and lower extremities bilaterally.    Assessment & Plan:    Hypertension Patient was normotensive on visit today. Reports compliance with Clonidine, Lisinopril, and Norvasc. Discontinued HCTZ after discharge on 05/13/2017. Will continue to monitor. Patient also reporting significant diet change with decreased sodium intake and decreased fried foods.   CVA (cerebral vascular accident) [facility]) Patient reporting no new symptoms and says he has noticed improvements in his word searching and pronunciation, however reports that he is not at his baseline. Speech recommended outpatient follow up, I have made a referral. He thinks he would benefit from this.   Diabetes mellitus (HCC) Patient reports that he is using his Lantus 13 U daily and monitoring his sugars in the morning. He indicates that it is running between 180-220 most days. Will start him on  Metformin 500 mg daily for one week, and if tolerated, he will increase to 500 mg twice per day. Will follow up in 4-6 weeks.   Acute kidney injury (HCC) Obtaining BMP for follow up on AKI while admitted 09/08.    [place] Aviana Shevlin, DO Family Medicine Resident PGY-1

## 2017-05-23 LAB — BASIC METABOLIC PANEL
BUN / CREAT RATIO: 15 (ref 9–20)
BUN: 17 mg/dL (ref 6–20)
CALCIUM: 10.1 mg/dL (ref 8.7–10.2)
CHLORIDE: 100 mmol/L (ref 96–106)
CO2: 26 mmol/L (ref 20–29)
Creatinine, Ser: 1.15 mg/dL (ref 0.76–1.27)
GFR calc Af Amer: 96 mL/min/{1.73_m2} (ref >59)
GFR calc non Af Amer: 83 mL/min/{1.73_m2} (ref >59)
GLUCOSE: 254 mg/dL — AB (ref 65–99)
POTASSIUM: 4.8 mmol/L (ref 3.5–5.2)
Sodium: 141 mmol/L (ref 134–144)

## 2017-05-29 ENCOUNTER — Other Ambulatory Visit: Payer: Self-pay | Admitting: *Deleted

## 2017-05-29 NOTE — Patient Outreach (Signed)
[facility]) Care Management  05/29/2017  Thomas Burch [birth year] [id number]  EMMI- Stroke RED ON EMMI ALERT DAY#: 13 DATE:05/28/17 RED ALERT: Thomas Burch to follow-up appointment? No   Outreach attempt to patient. HIPAA identifiers verified. The Emmi automated phone questionnaire recorded the incorrect answer. Patient had his hospital discharge appointment on 05/22/17. Patient reported, his MD appointment went "fine". Per EMR, patient is not taking his Aspirin as prescribed. Patient stated, he has not been able to get to the store to pick up his Aspirin. He plans to purchase the Aspirin today (05/29/17). RN CM educated patient about being adherent to his prescribed medication regimen. Patient was able to name his other prescribed medications. He stated, he was taking the rest of his medication as prescribed. Patient reported his last blood glucose reading 160 and blood pressure 139/90. Patient stated, he checks his blood pressure periodically and his blood glucose in the morning. RN CM educated patient on the importance of taking his blood pressure as prescribed. RN CM educated patient regarding the best times to check his blood pressure (when he is most relaxed). RN CM encouraged patient to contact his primary MD, if his blood pressure remains elevated. RN CM educated patient on the side effects of blood pressure medications. Patient acknowledged knowing when to contact the MD for any changes in his medical conditions. Patient has a scheduled primary MD appointment on 06/21/15 at 0800.  Plan: RN CM will notify [facility] CM administrative assistant regarding case closure.   Wynelle Cleveland, RN, BSN, MHA/MSL, [facility] Telephonic Care Manager Coordinator [facility] Direct Phone: [phone] Toll Free: [phone] Fax: [phone]

## 2017-06-12 ENCOUNTER — Other Ambulatory Visit: Payer: Self-pay | Admitting: Family Medicine

## 2017-06-20 ENCOUNTER — Ambulatory Visit: Payer: Self-pay | Admitting: Neurology

## 2017-06-27 ENCOUNTER — Ambulatory Visit: Payer: Self-pay | Admitting: Nurse Practitioner

## 2017-06-30 ENCOUNTER — Other Ambulatory Visit (HOSPITAL_COMMUNITY): Payer: Self-pay | Admitting: Family Medicine

## 2017-06-30 MED ORDER — ASPIRIN 325 MG PO TBEC: 325.0000 mg | DELAYED_RELEASE_TABLET | Freq: Every day | ORAL | 0 refills | Status: DC

## 2017-06-30 MED ORDER — CLOPIDOGREL BISULFATE 75 MG PO TABS: 75.0000 mg | ORAL_TABLET | Freq: Every day | ORAL | 0 refills | Status: DC

## 2017-06-30 MED ORDER — ATORVASTATIN CALCIUM 80 MG PO TABS: 80.0000 mg | ORAL_TABLET | Freq: Every day | ORAL | 0 refills | Status: DC

## 2017-07-04 ENCOUNTER — Ambulatory Visit: Payer: Self-pay | Admitting: Neurology

## 2017-07-11 ENCOUNTER — Telehealth: Payer: Self-pay

## 2017-07-11 NOTE — Telephone Encounter (Signed)
LEft vm for patient to call back about cardiac monitor results. 

## 2017-07-12 NOTE — Telephone Encounter (Signed)
Rn call patient that the cardiac monitor was negative for any irregular heart beat. Continue treatment plan. Take all medications as prescribed. ------

## 2017-07-14 ENCOUNTER — Other Ambulatory Visit: Payer: Self-pay | Admitting: Family Medicine

## 2017-07-14 MED ORDER — ATORVASTATIN CALCIUM 80 MG PO TABS: 80.0000 mg | ORAL_TABLET | Freq: Every day | ORAL | 0 refills | Status: DC

## 2017-07-14 MED ORDER — CLOPIDOGREL BISULFATE 75 MG PO TABS: 75.0000 mg | ORAL_TABLET | Freq: Every day | ORAL | 0 refills | Status: DC

## 2017-08-02 ENCOUNTER — Encounter: Payer: Self-pay | Admitting: Family Medicine

## 2017-08-04 ENCOUNTER — Other Ambulatory Visit: Payer: Self-pay | Admitting: Family Medicine

## 2017-08-04 MED ORDER — ATORVASTATIN CALCIUM 80 MG PO TABS: 80.0000 mg | ORAL_TABLET | Freq: Every day | ORAL | 1 refills | Status: DC

## 2017-08-04 MED ORDER — INSULIN GLARGINE 100 UNIT/ML SOLOSTAR PEN: 13.0000 [IU] | PEN_INJECTOR | Freq: Every morning | SUBCUTANEOUS | 1 refills | Status: DC

## 2017-08-04 MED ORDER — CLONIDINE HCL 0.2 MG PO TABS: 0.2000 mg | ORAL_TABLET | Freq: Every day | ORAL | 0 refills | Status: DC

## 2017-08-04 MED ORDER — LISINOPRIL 20 MG PO TABS: 20.0000 mg | ORAL_TABLET | Freq: Every day | ORAL | 1 refills | Status: DC

## 2017-08-04 MED ORDER — METFORMIN HCL 500 MG PO TABS: 500.0000 mg | ORAL_TABLET | Freq: Two times a day (BID) | ORAL | 0 refills | Status: DC

## 2017-08-04 MED ORDER — CLOPIDOGREL BISULFATE 75 MG PO TABS: 75.0000 mg | ORAL_TABLET | Freq: Every day | ORAL | 0 refills | Status: DC

## 2017-08-04 MED ORDER — ASPIRIN 325 MG PO TBEC: 325.0000 mg | DELAYED_RELEASE_TABLET | Freq: Every day | ORAL | 0 refills | Status: DC

## 2017-08-04 MED ORDER — AMLODIPINE BESYLATE 5 MG PO TABS: 5.0000 mg | ORAL_TABLET | Freq: Every day | ORAL | 0 refills | Status: DC

## 2017-10-11 ENCOUNTER — Other Ambulatory Visit: Payer: Self-pay | Admitting: *Deleted

## 2017-10-11 MED ORDER — AMLODIPINE BESYLATE 5 MG PO TABS: 5.0000 mg | ORAL_TABLET | Freq: Every day | ORAL | 0 refills | Status: DC

## 2017-10-11 MED ORDER — CLONIDINE HCL 0.2 MG PO TABS: 0.2000 mg | ORAL_TABLET | Freq: Every day | ORAL | 0 refills | Status: DC

## 2017-10-25 ENCOUNTER — Ambulatory Visit: Payer: Self-pay | Admitting: Neurology

## 2017-12-04 ENCOUNTER — Encounter: Payer: Self-pay | Admitting: Neurology

## 2017-12-04 ENCOUNTER — Ambulatory Visit (INDEPENDENT_AMBULATORY_CARE_PROVIDER_SITE_OTHER): Payer: Commercial Managed Care - PPO | Admitting: Neurology

## 2017-12-04 VITALS — BP 159/105 | HR 116 | Ht 70.0 in | Wt 235.6 lb

## 2017-12-04 DIAGNOSIS — I6330 Cerebral infarction due to thrombosis of unspecified cerebral artery: Secondary | ICD-10-CM | POA: Diagnosis not present

## 2017-12-04 DIAGNOSIS — I10 Essential (primary) hypertension: Secondary | ICD-10-CM

## 2017-12-04 DIAGNOSIS — E1059 Type 1 diabetes mellitus with other circulatory complications: Secondary | ICD-10-CM

## 2017-12-04 DIAGNOSIS — R0683 Snoring: Secondary | ICD-10-CM | POA: Diagnosis not present

## 2017-12-04 DIAGNOSIS — E785 Hyperlipidemia, unspecified: Secondary | ICD-10-CM | POA: Diagnosis not present

## 2017-12-04 MED ORDER — CLOPIDOGREL BISULFATE 75 MG PO TABS: 75.0000 mg | ORAL_TABLET | Freq: Every day | ORAL | 3 refills | Status: DC

## 2017-12-04 MED ORDER — CLONIDINE HCL 0.2 MG PO TABS: 0.2000 mg | ORAL_TABLET | Freq: Every day | ORAL | 3 refills | Status: DC

## 2017-12-04 MED ORDER — LISINOPRIL 20 MG PO TABS: 20.0000 mg | ORAL_TABLET | Freq: Two times a day (BID) | ORAL | 1 refills | Status: DC

## 2017-12-04 MED ORDER — AMLODIPINE BESYLATE 10 MG PO TABS: 10.0000 mg | ORAL_TABLET | Freq: Every day | ORAL | 3 refills | Status: DC

## 2017-12-04 NOTE — Progress Notes (Signed)
STROKE NEUROLOGY FOLLOW UP NOTE  NAME: Thomas Burch DOB: 12/03/1985  REASON FOR VISIT: stroke follow up HISTORY FROM: pt and chart  Today we had the pleasure of seeing NIKIA MANGINO in follow-up at our [facility]rology [facility]. Pt was accompanied by no one.   History Summary Mr. MILEY LINDON is a 32 y.o. male with history of HTN admitted on 04/29/17 for speech difficulty. Speech improved over time. Found to have newly diagnosed DM and HLD.  MRI showed right CR, right temporal lobe and left splenium punctate acute/subacute infarcts.  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  EF 50-55%.  TCD bubble study no PFO.  Had a TEE unremarkable.  CSF rule out CNS infection or vasculitis.  Hypercoagulable workup negative.  LDL of 115 and A1c 11.9.  UDS negative.  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  Also recommend 30-day cardio event monitor as outpatient to rule out A. fib although low suspicious.  Discharged on aspirin and Plavix as well as Lipitor 80.  Readmitted on 05/11/17 for recurrent slurred speech and confusion.  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  Continued DAPT and Lipitor on discharge  Interval History During the interval time, the patient has been doing well.  No recurrent stroke symptoms.  Had a 30-day cardio event monitoring as outpatient showed no A. Fib.  Patient BP better controlled, at home 140s/100, today in clinic 159/105.  Patient stated that he is sugar better controlled at home last check was 140.  He is on Lantus now.  He stated that he has been following with PCP, last visit was more than 2 months ago.  He also stated that he has a stressful job and he has been coping with.  REVIEW OF SYSTEMS: Full 14 system review  of systems performed and notable only for those listed below and in HPI above, all others are negative:  Constitutional:   Cardiovascular:  Ear/Nose/Throat:   Skin:  Eyes:   Respiratory: Snoring Gastroitestinal: Diarrhea Genitourinary:  Hematology/Lymphatic:   Endocrine:  Musculoskeletal:   Allergy/Immunology:   Neurological:   Psychiatric:  Sleep: Snoring, shift work  The following represents the patient's updated allergies and side effects list: No Known Allergies  The neurologically relevant items on the patient's problem list were reviewed on today's visit.  Neurologic Examination  A problem focused neurological exam (12 or more points of the single system neurologic examination, vital signs counts as 1 point, cranial nerves count for 8 points) was performed.  Blood pressure (!) 159/105, pulse (!) 116, height 5\' 10"  (1.778 m), weight 235 lb 9.6 oz (106.9 kg).  General - obese, well developed, in no apparent distress.  Ophthalmologic - Sharp disc margins OU.   Cardiovascular - Regular rate and rhythm with no murmur.  Mental Status -  Level of arousal and orientation to time, place, and person were intact. Language including expression, naming, repetition, comprehension was assessed and found intact. Attention span and concentration were normal. Fund of Knowledge was assessed and was intact.  Cranial Nerves II - XII - II - Visual field intact OU. III, IV, VI - Extraocular movements intact. V - Facial sensation intact bilaterally. VII - Facial movement intact bilaterally. VIII - Hearing & vestibular intact bilaterally. X - Palate elevates symmetrically. XI -  Chin turning & shoulder shrug intact bilaterally. XII - Tongue protrusion intact.  Motor Strength - The patient's strength was normal in all extremities and pronator drift was absent.  Bulk was normal and fasciculations were absent.   Motor Tone - Muscle tone was assessed at the neck and appendages and was  normal.  Reflexes - The patient's reflexes were 1+ in all extremities and he had no pathological reflexes.  Sensory - Light touch, temperature/pinprick, vibration and proprioception, and Romberg testing were assessed and were normal.    Coordination - The patient had normal movements in the hands and feet with no ataxia or dysmetria.  Tremor was absent.  Gait and Station - The patient's transfers, posture, gait, station, and turns were observed as normal.   Functional score  mRS = 0   0 - No symptoms.   1 - No significant disability. Able to carry out all usual activities, despite some symptoms.   2 - Slight disability. Able to look after own affairs without assistance, but unable to carry out all previous activities.   3 - Moderate disability. Requires some help, but able to walk unassisted.   4 - Moderately severe disability. Unable to attend to own bodily needs without assistance, and unable to walk unassisted.   5 - Severe disability. Requires constant nursing care and attention, bedridden, incontinent.   6 - Dead.   NIH Stroke Scale = 0   Data reviewed: I personally reviewed the images and agree with the radiology interpretations.  Ct Angio Head and neck W Or Wo Contrast 05/03/2017 IMPRESSION: CT HEAD: 1. No acute intracranial process ; patient's known acute small infarcts not apparent by CT. 2. Mild chronic small vessel ischemic disease. 3. Borderline parenchymal brain volume loss for age. CTA NECK: 1. Mild atherosclerosis without hemodynamically significant stenosis or acute vascular process. CTA HEAD: 1. No emergent large vessel occlusion. 2. Severe stenosis RIGHT proximal P2 segment. Moderate stenosis RIGHT P1 segment. 3. Mild cerebral artery luminal irregularity compatible with atherosclerosis.  Mr Brain Wo Contrast 05/02/2017 IMPRESSION: 1. 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 2 even smaller and likely subacute infarcts in the posterior right  cerebrum. 2. Mild but premature white matter signal abnormality, patient's medical history suggesting chronic small vessel ischemia.   TTE 05/03/2017 Study Conclusions - Left ventricle: Wall thickness was increased in a pattern ofmoderate LVH. There was focal basal hypertrophy. Systolicfunction was normal. The estimated ejection fraction was in therange of 50% to 55%. Features are consistent with a pseudonormalleft ventricular filling pattern, with concomitant abnormalrelaxation and increased filling pressure (grade 2 diastolicdysfunction). - Aortic valve: Valve area (Vmax): 3.01 cm^2.  TCD bubble study 05/04/2017 Summary: No high intensity transient signals (HITS) were heard at rest or with Valsalva. Therefore, there is no evidence of patent foramen ovale (PFO).  Carotid US 05/02/2017 Summary: Findings suggest 1-39% internal carotid artery stenosis bilaterally. Vertebral arteries are patent with antegrade flow.  TEE 05/04/2017 Left Ventrical:Low normal LV function - EF 45-50% Mitral Valve:normal MV, no vegetation. Aortic Valve:normal valve, no vegetation Tricuspid Valve: normal structure. Trivial TR , no vegetation  Pulmonic Valve: normal  Left Atrium/ Left atrial appendage:no thrombi  Atrial septum:no evidence of PFO or ASD by color flow or bubble study  Aorta:normal aorta   Ct Head Wo Contrast 05/11/2017 IMPRESSION: 1. Low-density in the left basal ganglia consistent with subacute lacunar infarcts, new exams 1 week prior. 2. Expected evolution of subcentimeter left occipital infarct. 3. No intracranial hemorrhage.  MRI  Brain Wo Contrast 05/12/2017 Acute/subacute infarct left anterior thalamus and posterior internal capsule as noted on recent CT. 5 mm acute infarct left cingulate gyrus. Negative for hemorrhage.  Component     Latest Ref Rng & Units 04/29/2017         1:52 PM  Tube #        Color, CSF     COLORLESS   Appearance, CSF     CLEAR    Supernatant        RBC Count, CSF     0 /cu mm   WBC, CSF     0 - 5 /cu mm   Lymphs, CSF     40 - 80 %   Monocyte-Macrophage-Spinal Fluid     15 - 45 %   Other Cells, CSF        Cholesterol     0 - 200 mg/dL   Triglycerides     <347 mg/dL   HDL Cholesterol     >42 mg/dL   Total CHOL/HDL Ratio     RATIO   VLDL     0 - 40 mg/dL   LDL (calc)     0 - 99 mg/dL   Specimen Description      CSF  Special Requests      Normal  Gram Stain      WBC PRESENT, PREDOMINANTLY MONONUCLEAR . . .  Culture      NO GROWTH 3 DAYS  Report Status      05/02/2017 FINAL  Specimen source hsv      CSF  HSV 1 DNA     Negative Negative  HSV 2 DNA     Negative Negative  PTT Lupus Anticoagulant     0.0 - 51.9 sec   DRVVT     0.0 - 47.0 sec   Lupus Anticoag Interp        Beta-2 Glycoprotein I Ab, IgG     0 - 20 GPI IgG units   Beta-2-Glycoprotein I IgM     0 - 32 GPI IgM units   Beta-2-Glycoprotein I IgA     0 - 25 GPI IgA units   Anticardiolipin Ab,IgG,Qn     0 - 14 GPL U/mL   Anticardiolipin Ab,IgM,Qn     0 - 12 MPL U/mL   Anticardiolipin Ab,IgA,Qn     0 - 11 APL U/mL   HSV Culture/Type      Comment  Source of Sample      CSF  Hemoglobin A1C     4.8 - 5.6 %   Mean Plasma Glucose     mg/dL   Glucose, CSF     40 - 70 mg/dL   Total  Protein, CSF     15 - 45 mg/dL   HIV Screen 4th Generation wRfx     Non Reactive   TSH     0.350 - 4.500 uIU/mL   C-Peptide     1.1 - 4.4 ng/mL   Antithrombin Activity     75 - 120 %   Protein C-Functional     73 - 180 %   Protein C, Total     60 - 150 %   Protein S-Functional     63 - 140 %   Protein S, Total     60 - 150 %   Homocysteine     0.0 - 15.0 umol/L   Recommendations-F5LEID:        Recommendations-PTGENE:  ds DNA Ab     0 - 9 IU/mL   ANA Ab, IFA        CRP     <1.0 mg/dL   Sed Rate     0 - 16 mm/hr   Ammonia     9 - 35 umol/L    Component     Latest Ref Rng & Units 04/29/2017         2:13 PM  Tube  #      1  Color, CSF     COLORLESS COLORLESS  Appearance, CSF     CLEAR CLEAR  Supernatant      NOT INDICATED  RBC Count, CSF     0 /cu mm 0  WBC, CSF     0 - 5 /cu mm 0  Lymphs, CSF     40 - 80 % RARE  Monocyte-Macrophage-Spinal Fluid     15 - 45 % RARE  Other Cells, CSF      TOO FEW TO COUNT, SMEAR AVAILABLE FOR REVIEW  Cholesterol     0 - 200 mg/dL   Triglycerides     <621 mg/dL   HDL Cholesterol     >30 mg/dL   Total CHOL/HDL Ratio     RATIO   VLDL     0 - 40 mg/dL   LDL (calc)     0 - 99 mg/dL   Specimen Description        Special Requests        Gram Stain        Culture        Report Status        Specimen source hsv        HSV 1 DNA     Negative   HSV 2 DNA     Negative   PTT Lupus Anticoagulant     0.0 - 51.9 sec   DRVVT     0.0 - 47.0 sec   Lupus Anticoag Interp        Beta-2 Glycoprotein I Ab, IgG     0 - 20 GPI IgG units   Beta-2-Glycoprotein I IgM     0 - 32 GPI IgM units   Beta-2-Glycoprotein I IgA     0 - 25 GPI IgA units   Anticardiolipin Ab,IgG,Qn     0 - 14 GPL U/mL   Anticardiolipin Ab,IgM,Qn     0 - 12 MPL U/mL   Anticardiolipin Ab,IgA,Qn     0 - 11 APL U/mL   HSV Culture/Type        Source of Sample        Hemoglobin A1C     4.8 - 5.6 %   Mean Plasma Glucose     mg/dL   Glucose, CSF     40 - 70 mg/dL   Total  Protein, CSF     15 - 45 mg/dL   HIV Screen 4th Generation wRfx     Non Reactive   TSH     0.350 - 4.500 uIU/mL   C-Peptide     1.1 - 4.4 ng/mL   Antithrombin Activity     75 - 120 %   Protein C-Functional     73 - 180 %   Protein C, Total     60 - 150 %   Protein S-Functional     63 - 140 %   Protein S, Total     60 - 150 %  Homocysteine     0.0 - 15.0 umol/L   Recommendations-F5LEID:        Recommendations-PTGENE:        ds DNA Ab     0 - 9 IU/mL   ANA Ab, IFA        CRP     <1.0 mg/dL   Sed Rate     0 - 16 mm/hr   Ammonia     9 - 35 umol/L    Component     Latest Ref Rng & Units  04/29/2017         2:14 PM  Tube #      4  Color, CSF     COLORLESS COLORLESS  Appearance, CSF     CLEAR CLEAR  Supernatant      NOT INDICATED  RBC Count, CSF     0 /cu mm 133 (H)  WBC, CSF     0 - 5 /cu mm 0  Lymphs, CSF     40 - 80 % RARE  Monocyte-Macrophage-Spinal Fluid     15 - 45 % RARE  Other Cells, CSF      TOO FEW TO COUNT, SMEAR AVAILABLE FOR REVIEW  Cholesterol     0 - 200 mg/dL   Triglycerides     <478 mg/dL   HDL Cholesterol     >29 mg/dL   Total CHOL/HDL Ratio     RATIO   VLDL     0 - 40 mg/dL   LDL (calc)     0 - 99 mg/dL   Specimen Description        Special Requests        Gram Stain        Culture        Report Status        Specimen source hsv        HSV 1 DNA     Negative   HSV 2 DNA     Negative   PTT Lupus Anticoagulant     0.0 - 51.9 sec   DRVVT     0.0 - 47.0 sec   Lupus Anticoag Interp        Beta-2 Glycoprotein I Ab, IgG     0 - 20 GPI IgG units   Beta-2-Glycoprotein I IgM     0 - 32 GPI IgM units   Beta-2-Glycoprotein I IgA     0 - 25 GPI IgA units   Anticardiolipin Ab,IgG,Qn     0 - 14 GPL U/mL   Anticardiolipin Ab,IgM,Qn     0 - 12 MPL U/mL   Anticardiolipin Ab,IgA,Qn     0 - 11 APL U/mL   HSV Culture/Type        Source of Sample        Hemoglobin A1C     4.8 - 5.6 %   Mean Plasma Glucose     mg/dL   Glucose, CSF     40 - 70 mg/dL   Total  Protein, CSF     15 - 45 mg/dL   HIV Screen 4th Generation wRfx     Non Reactive   TSH     0.350 - 4.500 uIU/mL   C-Peptide     1.1 - 4.4 ng/mL   Antithrombin Activity     75 - 120 %   Protein C-Functional     73 - 180 %   Protein C, Total     60 -  150 %   Protein S-Functional     63 - 140 %   Protein S, Total     60 - 150 %   Homocysteine     0.0 - 15.0 umol/L   Recommendations-F5LEID:        Recommendations-PTGENE:        ds DNA Ab     0 - 9 IU/mL   ANA Ab, IFA        CRP     <1.0 mg/dL   Sed Rate     0 - 16 mm/hr   Ammonia     9 - 35 umol/L     Component     Latest Ref Rng & Units 04/29/2017 04/29/2017 04/29/2017         2:15 PM  9:39 PM  9:51 PM  Tube #          Color, CSF     COLORLESS     Appearance, CSF     CLEAR     Supernatant          RBC Count, CSF     0 /cu mm     WBC, CSF     0 - 5 /cu mm     Lymphs, CSF     40 - 80 %     Monocyte-Macrophage-Spinal Fluid     15 - 45 %     Other Cells, CSF          Cholesterol     0 - 200 mg/dL     Triglycerides     <161 mg/dL     HDL Cholesterol     >40 mg/dL     Total CHOL/HDL Ratio     RATIO     VLDL     0 - 40 mg/dL     LDL (calc)     0 - 99 mg/dL     Specimen Description          Special Requests          Gram Stain          Culture          Report Status          Specimen source hsv          HSV 1 DNA     Negative     HSV 2 DNA     Negative     PTT Lupus Anticoagulant     0.0 - 51.9 sec     DRVVT     0.0 - 47.0 sec     Lupus Anticoag Interp          Beta-2 Glycoprotein I Ab, IgG     0 - 20 GPI IgG units     Beta-2-Glycoprotein I IgM     0 - 32 GPI IgM units     Beta-2-Glycoprotein I IgA     0 - 25 GPI IgA units     Anticardiolipin Ab,IgG,Qn     0 - 14 GPL U/mL     Anticardiolipin Ab,IgM,Qn     0 - 12 MPL U/mL     Anticardiolipin Ab,IgA,Qn     0 - 11 APL U/mL     HSV Culture/Type          Source of Sample          Hemoglobin A1C     4.8 - 5.6 %   11.9 (H)  Mean Plasma Glucose  mg/dL   161.09  Glucose, CSF     40 - 70 mg/dL 604 (H)    Total  Protein, CSF     15 - 45 mg/dL 24    HIV Screen 4th Generation wRfx     Non Reactive  Non Reactive   TSH     0.350 - 4.500 uIU/mL  0.493   C-Peptide     1.1 - 4.4 ng/mL     Antithrombin Activity     75 - 120 %     Protein C-Functional     73 - 180 %     Protein C, Total     60 - 150 %     Protein S-Functional     63 - 140 %     Protein S, Total     60 - 150 %     Homocysteine     0.0 - 15.0 umol/L     Recommendations-F5LEID:          Recommendations-PTGENE:          ds  DNA Ab     0 - 9 IU/mL     ANA Ab, IFA          CRP     <1.0 mg/dL     Sed Rate     0 - 16 mm/hr     Ammonia     9 - 35 umol/L      Component     Latest Ref Rng & Units 05/01/2017 05/03/2017 05/04/2017 05/11/2017             Tube #           Color, CSF     COLORLESS      Appearance, CSF     CLEAR      Supernatant           RBC Count, CSF     0 /cu mm      WBC, CSF     0 - 5 /cu mm      Lymphs, CSF     40 - 80 %      Monocyte-Macrophage-Spinal Fluid     15 - 45 %      Other Cells, CSF           Cholesterol     0 - 200 mg/dL  540    Triglycerides     <150 mg/dL  981 (H)    HDL Cholesterol     >40 mg/dL  34 (L)    Total CHOL/HDL Ratio     RATIO  5.4    VLDL     0 - 40 mg/dL  36    LDL (calc)     0 - 99 mg/dL  191 (H)    Specimen Description           Special Requests           Gram Stain           Culture           Report Status           Specimen source hsv           HSV 1 DNA     Negative      HSV 2 DNA     Negative      PTT Lupus Anticoagulant     0.0 - 51.9 sec   32.9   DRVVT  0.0 - 47.0 sec   38.7   Lupus Anticoag Interp        Comment:   Beta-2 Glycoprotein I Ab, IgG     0 - 20 GPI IgG units   <9   Beta-2-Glycoprotein I IgM     0 - 32 GPI IgM units   <9   Beta-2-Glycoprotein I IgA     0 - 25 GPI IgA units   <9   Anticardiolipin Ab,IgG,Qn     0 - 14 GPL U/mL   <9   Anticardiolipin Ab,IgM,Qn     0 - 12 MPL U/mL   <9   Anticardiolipin Ab,IgA,Qn     0 - 11 APL U/mL   <9   HSV Culture/Type           Source of Sample           Hemoglobin A1C     4.8 - 5.6 %      Mean Plasma Glucose     mg/dL      Glucose, CSF     40 - 70 mg/dL      Total  Protein, CSF     15 - 45 mg/dL      HIV Screen 4th Generation wRfx     Non Reactive      TSH     0.350 - 4.500 uIU/mL      C-Peptide     1.1 - 4.4 ng/mL 3.4     Antithrombin Activity     75 - 120 %   111   Protein C-Functional     73 - 180 %   133   Protein C, Total     60 - 150 %   90    Protein S-Functional     63 - 140 %   79   Protein S, Total     60 - 150 %   95   Homocysteine     0.0 - 15.0 umol/L   14.1   Recommendations-F5LEID:        Comment   Recommendations-PTGENE:        Comment   ds DNA Ab     0 - 9 IU/mL   <1   ANA Ab, IFA        Negative   CRP     <1.0 mg/dL   1.1 (H)   Sed Rate     0 - 16 mm/hr   3   Ammonia     9 - 35 umol/L    34    Assessment: As you may recall, he is a 32 y.o. Caucasian male with PMH of HTN admitted on 04/29/17 for right CR, right temporal lobe and left splenium punctate acute/subacute infarcts.  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  EF 50-55%.  TCD bubble study no PFO.  Had a TEE unremarkable.  CSF rule out CNS infection or vasculitis.  Hypercoagulable workup negative.  LDL of 115 and A1c 11.9. DM diagnosis was new. UDS negative.  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  Also recommend 30-day cardio event monitor as outpatient to rule out A. fib although low suspicious.  Discharged on aspirin and Plavix as well as Lipitor 80. Readmitted on 05/11/17 for left acute anterior thalamus and posterior internal capsule small infarct, as well as punctate left cingulate gyrus infarct.  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  Continued DAPT and Lipitor on discharge. Had a 30-day cardio event monitoring as outpatient showed no A. Fib.  Patient BP and DM better controlled, but BP still not at goal. Also complains of snoring. Will need to consider sleep study next visit   Plan:  - continue plavix and lipitor for stroke prevention - please discontinue aspirin  - increase amlodipine to 10mg  a day and increase lisinopril to 20mg  twice a day. BP goal < 130/80.  - check BP and glucose at home and record. Bring over to Dr. Talbert ForestShirley for medication adjustment if needed - Follow up with your primary care physician for stroke  risk factor modification. Recommend maintain blood pressure goal <130/80, diabetes with hemoglobin A1c goal below 7.0% and lipids with LDL cholesterol goal below 70 mg/dL.  - healthy diet and regular exercise - try to minimize your work stress as possible - please consider sleep study to rule out OSA next visit.  - follow up in 3 months with Shanda BumpsJessica  I spent more than 25 minutes of face to face time with the patient. Greater than 50% of time was spent in counseling and coordination of care. We discussed BP goal, BP medication adjustment, and PCP follow up with BP and DM.    No orders of the defined types were placed in this encounter.   Meds ordered this encounter  Medications  . amLODipine (NORVASC) 10 MG tablet    Sig: Take 1 tablet (10 mg total) by mouth daily.    Dispense:  90 tablet    Refill:  3  . cloNIDine (CATAPRES) 0.2 MG tablet    Sig: Take 1 tablet (0.2 mg total) by mouth daily.    Dispense:  90 tablet    Refill:  3  . lisinopril (PRINIVIL,ZESTRIL) 20 MG tablet    Sig: Take 1 tablet (20 mg total) by mouth 2 (two) times daily.    Dispense:  90 tablet    Refill:  1    Please consider 90 day supplies to promote better adherence  . clopidogrel (PLAVIX) 75 MG tablet    Sig: Take 1 tablet (75 mg total) by mouth daily.    Dispense:  90 tablet    Refill:  3    Please consider 90 day supplies to promote better adherence    Patient Instructions  - continue plavix and lipitor for stroke prevention - please discontinue aspirin  - increase amlodipine to 10mg  a day and increase lisinopril to 20mg  twice a day. BP goal < 130/80.  - check BP and glucose at home and record. Bring over to Dr. Talbert ForestShirley for medication adjustment if needed - Follow up with your primary care physician for stroke risk factor modification. Recommend maintain blood pressure goal <130/80, diabetes with hemoglobin A1c goal below 7.0% and lipids with LDL cholesterol goal below 70 mg/dL.  - healthy diet and  regular exercise - try to minimize your work stress as possible - follow up in 3 months with Fernand ParkinsJessica   Juanice Warburton, MD PhD [facility] [address], [address] [place], [place] [address] [phone]

## 2017-12-04 NOTE — Patient Instructions (Signed)
-   continue plavix and lipitor for stroke prevention - please discontinue aspirin  - increase amlodipine to 10mg  a day and increase lisinopril to 20mg  twice a day. BP goal < 130/80.  - check BP and glucose at home and record. Bring over to Thomas Burch for medication adjustment if needed - Follow up with your primary care physician for stroke risk factor modification. Recommend maintain blood pressure goal <130/80, diabetes with hemoglobin A1c goal below 7.0% and lipids with LDL cholesterol goal below 70 mg/dL.  - healthy diet and regular exercise - try to minimize your work stress as possible - follow up in 3 months with Thomas Burch

## 2017-12-07 ENCOUNTER — Telehealth: Payer: Self-pay | Admitting: Neurology

## 2017-12-07 ENCOUNTER — Other Ambulatory Visit: Payer: Self-pay

## 2017-12-07 MED ORDER — LISINOPRIL 20 MG PO TABS: 20.0000 mg | ORAL_TABLET | Freq: Two times a day (BID) | ORAL | 0 refills | Status: DC

## 2017-12-07 NOTE — Telephone Encounter (Signed)
Pt calling to confirm that [facility] has [facility] with [facility]  [facility] [address] - [place] ([address]), [place] - [address] [phone] (Phone) [phone] (Fax)     And they do not have his prescription for his lisinopril (PRINIVIL,ZESTRIL) 20 MG tabletpt asking that it be sent there please

## 2017-12-07 NOTE — Telephone Encounter (Signed)
Rn call patient about his blood pressure medication. Rn stated Dr. Roda ShuttersXu gave him refills until he can see his PCP, Dr .Shirley SwazilandJordan MD. Rn stated he needs to call her and schedule an appt within the next 1 to 2 months. Rn stated DR Roda ShuttersXu will manage his plavix for the stroke. But the blood pressure is manage by the PCP. RN stated lisinopril was sent via computer. PT states he will call Dr. Shirley SwazilandJordan for an appt. Pt was last seen 05/2017.

## 2018-03-05 ENCOUNTER — Ambulatory Visit: Payer: Commercial Managed Care - PPO | Admitting: Adult Health

## 2018-04-26 ENCOUNTER — Ambulatory Visit: Payer: Commercial Managed Care - PPO | Admitting: Adult Health

## 2018-04-26 NOTE — Progress Notes (Deleted)
STROKE NEUROLOGY FOLLOW UP NOTE  NAME: Thomas Burch DOB: 11-01-1985  REASON FOR VISIT: stroke follow up HISTORY FROM: pt and chart  Today we had the pleasure of seeing Thomas Burch in follow-up at our [facility]rology [facility]. Pt was accompanied by no one.   History Summary [name] is a 32 y.o. male with history of HTN admitted on 04/29/17 for speech difficulty. Speech improved over time. Found to have newly diagnosed DM and HLD.  MRI showed right CR, right temporal lobe and left splenium punctate acute/subacute infarcts.  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  EF 50-55%.  TCD bubble study no PFO.  Had a TEE unremarkable.  CSF rule out CNS infection or vasculitis.  Hypercoagulable workup negative.  LDL of 115 and A1c 11.9.  UDS negative.  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  Also recommend 30-day cardio event monitor as outpatient to rule out A. fib although low suspicious.  Discharged on aspirin and Plavix as well as Lipitor 80.  Readmitted on 05/11/17 for recurrent slurred speech and confusion.  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  Continued DAPT and Lipitor on discharge  12/04/2017 visit JX: During the interval time, the patient has been doing well.  No recurrent stroke symptoms.  Had a 30-day cardio event monitoring as outpatient showed no A. Fib.  Patient BP better controlled, at home 140s/100, today in clinic 159/105.  Patient stated that he is sugar better controlled at home last check was 140.  He is on Lantus now.  He stated that he has been following with PCP, last visit was more than 2 months ago.  He also stated that he has a stressful job and he has been coping with.  Interval history 04/26/2018:   REVIEW  OF SYSTEMS: Full 14 system review of systems performed and notable only for those listed below and in HPI above, all others are negative:  Constitutional:   Cardiovascular:  Ear/Nose/Throat:   Skin:  Eyes:   Respiratory: Snoring Gastroitestinal: Diarrhea Genitourinary:  Hematology/Lymphatic:   Endocrine:  Musculoskeletal:   Allergy/Immunology:   Neurological:   Psychiatric:  Sleep: Snoring, shift work  The following represents the patient's updated allergies and side effects list: No Known Allergies  The neurologically relevant items on the patient's problem list were reviewed on today's visit.  Neurologic Examination  A problem focused neurological exam (12 or more points of the single system neurologic examination, vital signs counts as 1 point, cranial nerves count for 8 points) was performed.  There were no vitals taken for this visit.  General - obese, well developed, in no apparent distress.  Ophthalmologic - Sharp disc margins OU.   Cardiovascular - Regular rate and rhythm with no murmur.  Mental Status -  Level of arousal and orientation to time, place, and person were intact. Language including expression, naming, repetition, comprehension was assessed and found intact. Attention span and concentration were normal. Fund of Knowledge was assessed and was intact.  Cranial Nerves II - XII - II - Visual field intact OU. III, IV, VI - Extraocular movements intact. V - Facial sensation intact bilaterally. VII - Facial movement intact bilaterally. VIII - Hearing & vestibular intact bilaterally. X - Palate elevates symmetrically. XI - Chin turning & shoulder shrug  intact bilaterally. XII - Tongue protrusion intact.  Motor Strength - The patient's strength was normal in all extremities and pronator drift was absent.  Bulk was normal and fasciculations were absent.   Motor Tone - Muscle tone was assessed at the neck and appendages and was normal.  Reflexes - The  patient's reflexes were 1+ in all extremities and he had no pathological reflexes.  Sensory - Light touch, temperature/pinprick, vibration and proprioception, and Romberg testing were assessed and were normal.    Coordination - The patient had normal movements in the hands and feet with no ataxia or dysmetria.  Tremor was absent.  Gait and Station - The patient's transfers, posture, gait, station, and turns were observed as normal.   Functional score  mRS = 0   0 - No symptoms.   1 - No significant disability. Able to carry out all usual activities, despite some symptoms.   2 - Slight disability. Able to look after own affairs without assistance, but unable to carry out all previous activities.   3 - Moderate disability. Requires some help, but able to walk unassisted.   4 - Moderately severe disability. Unable to attend to own bodily needs without assistance, and unable to walk unassisted.   5 - Severe disability. Requires constant nursing care and attention, bedridden, incontinent.   6 - Dead.   NIH Stroke Scale = 0   Data reviewed: I personally reviewed the images and agree with the radiology interpretations.  Ct Angio Head and neck W Or Wo Contrast 05/03/2017 IMPRESSION: CT HEAD: 1. No acute intracranial process ; patient's known acute small infarcts not apparent by CT. 2. Mild chronic small vessel ischemic disease. 3. Borderline parenchymal brain volume loss for age. CTA NECK: 1. Mild atherosclerosis without hemodynamically significant stenosis or acute vascular process. CTA HEAD: 1. No emergent large vessel occlusion. 2. Severe stenosis RIGHT proximal P2 segment. Moderate stenosis RIGHT P1 segment. 3. Mild cerebral artery luminal irregularity compatible with atherosclerosis.  Mr Brain Wo Contrast 05/02/2017 IMPRESSION: 1. 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 2 even smaller and likely subacute infarcts in the posterior right cerebrum. 2. Mild but  premature white matter signal abnormality, patient's medical history suggesting chronic small vessel ischemia.   TTE 05/03/2017 Study Conclusions - Left ventricle: Wall thickness was increased in a pattern ofmoderate LVH. There was focal basal hypertrophy. Systolicfunction was normal. The estimated ejection fraction was in therange of 50% to 55%. Features are consistent with a pseudonormalleft ventricular filling pattern, with concomitant abnormalrelaxation and increased filling pressure (grade 2 diastolicdysfunction). - Aortic valve: Valve area (Vmax): 3.01 cm^2.  TCD bubble study 05/04/2017 Summary: No high intensity transient signals (HITS) were heard at rest or with Valsalva. Therefore, there is no evidence of patent foramen ovale (PFO).  Carotid US 05/02/2017 Summary: Findings suggest 1-39% internal carotid artery stenosis bilaterally. Vertebral arteries are patent with antegrade flow.  TEE 05/04/2017 Left Ventrical:Low normal LV function - EF 45-50% Mitral Valve:normal MV, no vegetation. Aortic Valve:normal valve, no vegetation Tricuspid Valve: normal structure. Trivial TR , no vegetation  Pulmonic Valve: normal  Left Atrium/ Left atrial appendage:no thrombi  Atrial septum:no evidence of PFO or ASD by color flow or bubble study  Aorta:normal aorta   Ct Head Wo Contrast 05/11/2017 IMPRESSION: 1. Low-density in the left basal ganglia consistent with subacute lacunar infarcts, new exams 1 week prior. 2. Expected evolution of subcentimeter left occipital infarct. 3. No intracranial hemorrhage.  MRI Brain Wo Contrast 05/12/2017 Acute/subacute  infarct left anterior thalamus and posterior internal capsule as noted on recent CT. 5 mm acute infarct left cingulate gyrus. Negative for hemorrhage.  Component     Latest Ref Rng & Units 04/29/2017         1:52 PM  Tube #        Color, CSF     COLORLESS   Appearance, CSF     CLEAR   Supernatant        RBC Count,  CSF     0 /cu mm   WBC, CSF     0 - 5 /cu mm   Lymphs, CSF     40 - 80 %   Monocyte-Macrophage-Spinal Fluid     15 - 45 %   Other Cells, CSF        Cholesterol     0 - 200 mg/dL   Triglycerides     <161 mg/dL   HDL Cholesterol     >09 mg/dL   Total CHOL/HDL Ratio     RATIO   VLDL     0 - 40 mg/dL   LDL (calc)     0 - 99 mg/dL   Specimen Description      CSF  Special Requests      Normal  Gram Stain      WBC PRESENT, PREDOMINANTLY MONONUCLEAR . . .  Culture      NO GROWTH 3 DAYS  Report Status      05/02/2017 FINAL  Specimen source hsv      CSF  HSV 1 DNA     Negative Negative  HSV 2 DNA     Negative Negative  PTT Lupus Anticoagulant     0.0 - 51.9 sec   DRVVT     0.0 - 47.0 sec   Lupus Anticoag Interp        Beta-2 Glycoprotein I Ab, IgG     0 - 20 GPI IgG units   Beta-2-Glycoprotein I IgM     0 - 32 GPI IgM units   Beta-2-Glycoprotein I IgA     0 - 25 GPI IgA units   Anticardiolipin Ab,IgG,Qn     0 - 14 GPL U/mL   Anticardiolipin Ab,IgM,Qn     0 - 12 MPL U/mL   Anticardiolipin Ab,IgA,Qn     0 - 11 APL U/mL   HSV Culture/Type      Comment  Source of Sample      CSF  Hemoglobin A1C     4.8 - 5.6 %   Mean Plasma Glucose     mg/dL   Glucose, CSF     40 - 70 mg/dL   Total  Protein, CSF     15 - 45 mg/dL   HIV Screen 4th Generation wRfx     Non Reactive   TSH     0.350 - 4.500 uIU/mL   C-Peptide     1.1 - 4.4 ng/mL   Antithrombin Activity     75 - 120 %   Protein C-Functional     73 - 180 %   Protein C, Total     60 - 150 %   Protein S-Functional     63 - 140 %   Protein S, Total     60 - 150 %   Homocysteine     0.0 - 15.0 umol/L   Recommendations-F5LEID:        Recommendations-PTGENE:        ds  DNA Ab     0 - 9 IU/mL   ANA Ab, IFA        CRP     <1.0 mg/dL   Sed Rate     0 - 16 mm/hr   Ammonia     9 - 35 umol/L    Component     Latest Ref Rng & Units 04/29/2017         2:13 PM  Tube #      1  Color, CSF      COLORLESS COLORLESS  Appearance, CSF     CLEAR CLEAR  Supernatant      NOT INDICATED  RBC Count, CSF     0 /cu mm 0  WBC, CSF     0 - 5 /cu mm 0  Lymphs, CSF     40 - 80 % RARE  Monocyte-Macrophage-Spinal Fluid     15 - 45 % RARE  Other Cells, CSF      TOO FEW TO COUNT, SMEAR AVAILABLE FOR REVIEW  Cholesterol     0 - 200 mg/dL   Triglycerides     <161 mg/dL   HDL Cholesterol     >09 mg/dL   Total CHOL/HDL Ratio     RATIO   VLDL     0 - 40 mg/dL   LDL (calc)     0 - 99 mg/dL   Specimen Description        Special Requests        Gram Stain        Culture        Report Status        Specimen source hsv        HSV 1 DNA     Negative   HSV 2 DNA     Negative   PTT Lupus Anticoagulant     0.0 - 51.9 sec   DRVVT     0.0 - 47.0 sec   Lupus Anticoag Interp        Beta-2 Glycoprotein I Ab, IgG     0 - 20 GPI IgG units   Beta-2-Glycoprotein I IgM     0 - 32 GPI IgM units   Beta-2-Glycoprotein I IgA     0 - 25 GPI IgA units   Anticardiolipin Ab,IgG,Qn     0 - 14 GPL U/mL   Anticardiolipin Ab,IgM,Qn     0 - 12 MPL U/mL   Anticardiolipin Ab,IgA,Qn     0 - 11 APL U/mL   HSV Culture/Type        Source of Sample        Hemoglobin A1C     4.8 - 5.6 %   Mean Plasma Glucose     mg/dL   Glucose, CSF     40 - 70 mg/dL   Total  Protein, CSF     15 - 45 mg/dL   HIV Screen 4th Generation wRfx     Non Reactive   TSH     0.350 - 4.500 uIU/mL   C-Peptide     1.1 - 4.4 ng/mL   Antithrombin Activity     75 - 120 %   Protein C-Functional     73 - 180 %   Protein C, Total     60 - 150 %   Protein S-Functional     63 - 140 %   Protein S, Total     60 - 150 %  Homocysteine     0.0 - 15.0 umol/L   Recommendations-F5LEID:        Recommendations-PTGENE:        ds DNA Ab     0 - 9 IU/mL   ANA Ab, IFA        CRP     <1.0 mg/dL   Sed Rate     0 - 16 mm/hr   Ammonia     9 - 35 umol/L    Component     Latest Ref Rng & Units 04/29/2017         2:14 PM   Tube #      4  Color, CSF     COLORLESS COLORLESS  Appearance, CSF     CLEAR CLEAR  Supernatant      NOT INDICATED  RBC Count, CSF     0 /cu mm 133 (H)  WBC, CSF     0 - 5 /cu mm 0  Lymphs, CSF     40 - 80 % RARE  Monocyte-Macrophage-Spinal Fluid     15 - 45 % RARE  Other Cells, CSF      TOO FEW TO COUNT, SMEAR AVAILABLE FOR REVIEW  Cholesterol     0 - 200 mg/dL   Triglycerides     <161 mg/dL   HDL Cholesterol     >09 mg/dL   Total CHOL/HDL Ratio     RATIO   VLDL     0 - 40 mg/dL   LDL (calc)     0 - 99 mg/dL   Specimen Description        Special Requests        Gram Stain        Culture        Report Status        Specimen source hsv        HSV 1 DNA     Negative   HSV 2 DNA     Negative   PTT Lupus Anticoagulant     0.0 - 51.9 sec   DRVVT     0.0 - 47.0 sec   Lupus Anticoag Interp        Beta-2 Glycoprotein I Ab, IgG     0 - 20 GPI IgG units   Beta-2-Glycoprotein I IgM     0 - 32 GPI IgM units   Beta-2-Glycoprotein I IgA     0 - 25 GPI IgA units   Anticardiolipin Ab,IgG,Qn     0 - 14 GPL U/mL   Anticardiolipin Ab,IgM,Qn     0 - 12 MPL U/mL   Anticardiolipin Ab,IgA,Qn     0 - 11 APL U/mL   HSV Culture/Type        Source of Sample        Hemoglobin A1C     4.8 - 5.6 %   Mean Plasma Glucose     mg/dL   Glucose, CSF     40 - 70 mg/dL   Total  Protein, CSF     15 - 45 mg/dL   HIV Screen 4th Generation wRfx     Non Reactive   TSH     0.350 - 4.500 uIU/mL   C-Peptide     1.1 - 4.4 ng/mL   Antithrombin Activity     75 - 120 %   Protein C-Functional     73 - 180 %   Protein C, Total     60 -  150 %   Protein S-Functional     63 - 140 %   Protein S, Total     60 - 150 %   Homocysteine     0.0 - 15.0 umol/L   Recommendations-F5LEID:        Recommendations-PTGENE:        ds DNA Ab     0 - 9 IU/mL   ANA Ab, IFA        CRP     <1.0 mg/dL   Sed Rate     0 - 16 mm/hr   Ammonia     9 - 35 umol/L    Component     Latest  Ref Rng & Units 04/29/2017 04/29/2017 04/29/2017         2:15 PM  9:39 PM  9:51 PM  Tube #          Color, CSF     COLORLESS     Appearance, CSF     CLEAR     Supernatant          RBC Count, CSF     0 /cu mm     WBC, CSF     0 - 5 /cu mm     Lymphs, CSF     40 - 80 %     Monocyte-Macrophage-Spinal Fluid     15 - 45 %     Other Cells, CSF          Cholesterol     0 - 200 mg/dL     Triglycerides     <161 mg/dL     HDL Cholesterol     >40 mg/dL     Total CHOL/HDL Ratio     RATIO     VLDL     0 - 40 mg/dL     LDL (calc)     0 - 99 mg/dL     Specimen Description          Special Requests          Gram Stain          Culture          Report Status          Specimen source hsv          HSV 1 DNA     Negative     HSV 2 DNA     Negative     PTT Lupus Anticoagulant     0.0 - 51.9 sec     DRVVT     0.0 - 47.0 sec     Lupus Anticoag Interp          Beta-2 Glycoprotein I Ab, IgG     0 - 20 GPI IgG units     Beta-2-Glycoprotein I IgM     0 - 32 GPI IgM units     Beta-2-Glycoprotein I IgA     0 - 25 GPI IgA units     Anticardiolipin Ab,IgG,Qn     0 - 14 GPL U/mL     Anticardiolipin Ab,IgM,Qn     0 - 12 MPL U/mL     Anticardiolipin Ab,IgA,Qn     0 - 11 APL U/mL     HSV Culture/Type          Source of Sample          Hemoglobin A1C     4.8 - 5.6 %   11.9 (H)  Mean Plasma Glucose  mg/dL   161.09  Glucose, CSF     40 - 70 mg/dL 604 (H)    Total  Protein, CSF     15 - 45 mg/dL 24    HIV Screen 4th Generation wRfx     Non Reactive  Non Reactive   TSH     0.350 - 4.500 uIU/mL  0.493   C-Peptide     1.1 - 4.4 ng/mL     Antithrombin Activity     75 - 120 %     Protein C-Functional     73 - 180 %     Protein C, Total     60 - 150 %     Protein S-Functional     63 - 140 %     Protein S, Total     60 - 150 %     Homocysteine     0.0 - 15.0 umol/L     Recommendations-F5LEID:          Recommendations-PTGENE:          ds DNA Ab     0 - 9 IU/mL      ANA Ab, IFA          CRP     <1.0 mg/dL     Sed Rate     0 - 16 mm/hr     Ammonia     9 - 35 umol/L      Component     Latest Ref Rng & Units 05/01/2017 05/03/2017 05/04/2017 05/11/2017             Tube #           Color, CSF     COLORLESS      Appearance, CSF     CLEAR      Supernatant           RBC Count, CSF     0 /cu mm      WBC, CSF     0 - 5 /cu mm      Lymphs, CSF     40 - 80 %      Monocyte-Macrophage-Spinal Fluid     15 - 45 %      Other Cells, CSF           Cholesterol     0 - 200 mg/dL  540    Triglycerides     <150 mg/dL  981 (H)    HDL Cholesterol     >40 mg/dL  34 (L)    Total CHOL/HDL Ratio     RATIO  5.4    VLDL     0 - 40 mg/dL  36    LDL (calc)     0 - 99 mg/dL  191 (H)    Specimen Description           Special Requests           Gram Stain           Culture           Report Status           Specimen source hsv           HSV 1 DNA     Negative      HSV 2 DNA     Negative      PTT Lupus Anticoagulant     0.0 - 51.9 sec   32.9   DRVVT  0.0 - 47.0 sec   38.7   Lupus Anticoag Interp        Comment:   Beta-2 Glycoprotein I Ab, IgG     0 - 20 GPI IgG units   <9   Beta-2-Glycoprotein I IgM     0 - 32 GPI IgM units   <9   Beta-2-Glycoprotein I IgA     0 - 25 GPI IgA units   <9   Anticardiolipin Ab,IgG,Qn     0 - 14 GPL U/mL   <9   Anticardiolipin Ab,IgM,Qn     0 - 12 MPL U/mL   <9   Anticardiolipin Ab,IgA,Qn     0 - 11 APL U/mL   <9   HSV Culture/Type           Source of Sample           Hemoglobin A1C     4.8 - 5.6 %      Mean Plasma Glucose     mg/dL      Glucose, CSF     40 - 70 mg/dL      Total  Protein, CSF     15 - 45 mg/dL      HIV Screen 4th Generation wRfx     Non Reactive      TSH     0.350 - 4.500 uIU/mL      C-Peptide     1.1 - 4.4 ng/mL 3.4     Antithrombin Activity     75 - 120 %   111   Protein C-Functional     73 - 180 %   133   Protein C, Total     60 - 150 %   90   Protein S-Functional      63 - 140 %   79   Protein S, Total     60 - 150 %   95   Homocysteine     0.0 - 15.0 umol/L   14.1   Recommendations-F5LEID:        Comment   Recommendations-PTGENE:        Comment   ds DNA Ab     0 - 9 IU/mL   <1   ANA Ab, IFA        Negative   CRP     <1.0 mg/dL   1.1 (H)   Sed Rate     0 - 16 mm/hr   3   Ammonia     9 - 35 umol/L    34    Assessment: As you may recall, he is a 32 y.o. Caucasian male with PMH of HTN admitted on 04/29/17 for right CR, right temporal lobe and left splenium punctate acute/subacute infarcts.  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  EF 50-55%.  TCD bubble study no PFO.  Had a TEE unremarkable.  CSF rule out CNS infection or vasculitis.  Hypercoagulable workup negative.  LDL of 115 and A1c 11.9. DM diagnosis was new. UDS negative.  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  Also recommend 30-day cardio event monitor as outpatient to rule out A. fib although low suspicious.  Discharged on aspirin and Plavix as well as Lipitor 80. Readmitted on 05/11/17 for left acute anterior thalamus and posterior internal capsule small infarct, as well as punctate left cingulate gyrus infarct.  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  Continued DAPT and Lipitor on discharge. Had a 30-day cardio event monitoring as outpatient showed no A. Fib.  Patient BP and DM better controlled, but BP still not at goal. Also complains of snoring. Will need to consider sleep study next visit   Plan:  - continue plavix and lipitor for stroke prevention - please discontinue aspirin  - increase amlodipine to 10mg  a day and increase lisinopril to 20mg  twice a day. BP goal < 130/80.  - check BP and glucose at home and record. Bring over to Dr. Talbert Forest for medication adjustment if needed - Follow up with your primary care physician for stroke risk factor modification.  Recommend maintain blood pressure goal <130/80, diabetes with hemoglobin A1c goal below 7.0% and lipids with LDL cholesterol goal below 70 mg/dL.  - healthy diet and regular exercise - try to minimize your work stress as possible - please consider sleep study to rule out OSA next visit.  - follow up in 3 months with Shanda Bumps  I spent more than 25 minutes of face to face time with the patient. Greater than 50% of time was spent in counseling and coordination of care. We discussed BP goal, BP medication adjustment, and PCP follow up with BP and DM.    George Hugh, AGNP-BC  [facility] [address] [place], [place] [address]  Phone [phone] Fax [phone] Note: This document was prepared with digital dictation and possible smart phrase technology. Any transcriptional errors that result from this process are unintentional.

## 2018-04-27 ENCOUNTER — Encounter: Payer: Self-pay | Admitting: Adult Health

## 2018-08-07 ENCOUNTER — Other Ambulatory Visit: Payer: Self-pay | Admitting: Family Medicine

## 2018-08-09 NOTE — Telephone Encounter (Signed)
LM for patient ok per DPR asking him to call the office back and make an appointment.  [name],CMA

## 2018-08-09 NOTE — Telephone Encounter (Signed)
Patient needs to be seen, but will give refill until can be seen.

## 2018-10-21 ENCOUNTER — Emergency Department (HOSPITAL_COMMUNITY): Payer: Commercial Managed Care - PPO

## 2018-10-21 ENCOUNTER — Encounter (HOSPITAL_COMMUNITY): Payer: Self-pay | Admitting: Emergency Medicine

## 2018-10-21 ENCOUNTER — Inpatient Hospital Stay (HOSPITAL_COMMUNITY)
Admission: EM | Admit: 2018-10-21 | Discharge: 2018-10-24 | DRG: 066 | Disposition: A | Discharge status: Home / Self Care | Payer: Commercial Managed Care - PPO | Attending: Family Medicine | Admitting: Family Medicine

## 2018-10-21 DIAGNOSIS — R9431 Abnormal electrocardiogram [ECG] [EKG]: Secondary | ICD-10-CM | POA: Diagnosis present

## 2018-10-21 DIAGNOSIS — Z8349 Family history of other endocrine, nutritional and metabolic diseases: Secondary | ICD-10-CM

## 2018-10-21 DIAGNOSIS — Z833 Family history of diabetes mellitus: Secondary | ICD-10-CM | POA: Diagnosis not present

## 2018-10-21 DIAGNOSIS — G459 Transient cerebral ischemic attack, unspecified: Secondary | ICD-10-CM

## 2018-10-21 DIAGNOSIS — I639 Cerebral infarction, unspecified: Secondary | ICD-10-CM | POA: Diagnosis present

## 2018-10-21 DIAGNOSIS — E785 Hyperlipidemia, unspecified: Secondary | ICD-10-CM | POA: Diagnosis not present

## 2018-10-21 DIAGNOSIS — R471 Dysarthria and anarthria: Secondary | ICD-10-CM | POA: Diagnosis present

## 2018-10-21 DIAGNOSIS — Z6831 Body mass index (BMI) 31.0-31.9, adult: Secondary | ICD-10-CM

## 2018-10-21 DIAGNOSIS — E1065 Type 1 diabetes mellitus with hyperglycemia: Secondary | ICD-10-CM | POA: Diagnosis present

## 2018-10-21 DIAGNOSIS — Z8249 Family history of ischemic heart disease and other diseases of the circulatory system: Secondary | ICD-10-CM | POA: Diagnosis not present

## 2018-10-21 DIAGNOSIS — R4701 Aphasia: Secondary | ICD-10-CM | POA: Diagnosis not present

## 2018-10-21 DIAGNOSIS — IMO0002 Reserved for concepts with insufficient information to code with codable children: Secondary | ICD-10-CM | POA: Diagnosis present

## 2018-10-21 DIAGNOSIS — R413 Other amnesia: Secondary | ICD-10-CM | POA: Diagnosis not present

## 2018-10-21 DIAGNOSIS — Z823 Family history of stroke: Secondary | ICD-10-CM

## 2018-10-21 DIAGNOSIS — I10 Essential (primary) hypertension: Secondary | ICD-10-CM | POA: Diagnosis not present

## 2018-10-21 DIAGNOSIS — E1165 Type 2 diabetes mellitus with hyperglycemia: Secondary | ICD-10-CM | POA: Diagnosis present

## 2018-10-21 DIAGNOSIS — I6381 Other cerebral infarction due to occlusion or stenosis of small artery: Secondary | ICD-10-CM | POA: Diagnosis not present

## 2018-10-21 DIAGNOSIS — Z7902 Long term (current) use of antithrombotics/antiplatelets: Secondary | ICD-10-CM

## 2018-10-21 DIAGNOSIS — I672 Cerebral atherosclerosis: Secondary | ICD-10-CM | POA: Diagnosis present

## 2018-10-21 DIAGNOSIS — Z9119 Patient's noncompliance with other medical treatment and regimen: Secondary | ICD-10-CM

## 2018-10-21 DIAGNOSIS — Z8673 Personal history of transient ischemic attack (TIA), and cerebral infarction without residual deficits: Secondary | ICD-10-CM

## 2018-10-21 DIAGNOSIS — R0683 Snoring: Secondary | ICD-10-CM | POA: Diagnosis present

## 2018-10-21 DIAGNOSIS — G4733 Obstructive sleep apnea (adult) (pediatric): Secondary | ICD-10-CM | POA: Diagnosis present

## 2018-10-21 DIAGNOSIS — Z79899 Other long term (current) drug therapy: Secondary | ICD-10-CM

## 2018-10-21 DIAGNOSIS — E1051 Type 1 diabetes mellitus with diabetic peripheral angiopathy without gangrene: Secondary | ICD-10-CM | POA: Diagnosis present

## 2018-10-21 DIAGNOSIS — Z794 Long term (current) use of insulin: Secondary | ICD-10-CM

## 2018-10-21 DIAGNOSIS — J0100 Acute maxillary sinusitis, unspecified: Secondary | ICD-10-CM | POA: Diagnosis present

## 2018-10-21 DIAGNOSIS — R000 Tachycardia, unspecified: Secondary | ICD-10-CM | POA: Diagnosis not present

## 2018-10-21 DIAGNOSIS — Z9114 Patient's other noncompliance with medication regimen: Secondary | ICD-10-CM

## 2018-10-21 DIAGNOSIS — R51 Headache: Secondary | ICD-10-CM | POA: Diagnosis not present

## 2018-10-21 DIAGNOSIS — R29700 NIHSS score 0: Secondary | ICD-10-CM | POA: Diagnosis present

## 2018-10-21 HISTORY — DX: Cerebral infarction due to thrombosis of unspecified cerebral artery: I63.30

## 2018-10-21 HISTORY — DX: Dysphasia: R47.02

## 2018-10-21 LAB — PROTIME-INR
INR: 1.02
Prothrombin Time: 13.3 seconds (ref 11.4–15.2)

## 2018-10-21 LAB — COMPREHENSIVE METABOLIC PANEL
ALT: 47 U/L — ABNORMAL HIGH (ref 0–44)
AST: 26 U/L (ref 15–41)
Albumin: 4.2 g/dL (ref 3.5–5.0)
Alkaline Phosphatase: 65 U/L (ref 38–126)
Anion gap: 12 (ref 5–15)
BUN: 20 mg/dL (ref 6–20)
CHLORIDE: 97 mmol/L — AB (ref 98–111)
CO2: 23 mmol/L (ref 22–32)
Calcium: 9.7 mg/dL (ref 8.9–10.3)
Creatinine, Ser: 1.12 mg/dL (ref 0.61–1.24)
GFR calc Af Amer: >60 mL/min (ref >60)
GFR calc non Af Amer: >60 mL/min (ref >60)
Glucose, Bld: 283 mg/dL — ABNORMAL HIGH (ref 70–99)
Potassium: 4.6 mmol/L (ref 3.5–5.1)
Sodium: 132 mmol/L — ABNORMAL LOW (ref 135–145)
Total Bilirubin: 1 mg/dL (ref 0.3–1.2)
Total Protein: 7.2 g/dL (ref 6.5–8.1)

## 2018-10-21 LAB — CBC
HCT: 48.1 % (ref 39.0–52.0)
HEMOGLOBIN: 15.4 g/dL (ref 13.0–17.0)
MCH: 28.2 pg (ref 26.0–34.0)
MCHC: 32 g/dL (ref 30.0–36.0)
MCV: 87.9 fL (ref 80.0–100.0)
NRBC: 0 % (ref 0.0–0.2)
Platelets: 295 10*3/uL (ref 150–400)
RBC: 5.47 MIL/uL (ref 4.22–5.81)
RDW: 12 % (ref 11.5–15.5)
WBC: 15.3 10*3/uL — ABNORMAL HIGH (ref 4.0–10.5)

## 2018-10-21 LAB — CBG MONITORING, ED: Glucose-Capillary: 244 mg/dL — ABNORMAL HIGH (ref 70–99)

## 2018-10-21 LAB — DIFFERENTIAL
Abs Immature Granulocytes: 0.06 10*3/uL (ref 0.00–0.07)
Basophils Absolute: 0.1 10*3/uL (ref 0.0–0.1)
Basophils Relative: 0 %
Eosinophils Absolute: 0 10*3/uL (ref 0.0–0.5)
Eosinophils Relative: 0 %
IMMATURE GRANULOCYTES: 0 %
Lymphocytes Relative: 11 %
Lymphs Abs: 1.8 10*3/uL (ref 0.7–4.0)
Monocytes Absolute: 0.7 10*3/uL (ref 0.1–1.0)
Monocytes Relative: 4 %
Neutro Abs: 12.8 10*3/uL — ABNORMAL HIGH (ref 1.7–7.7)
Neutrophils Relative %: 85 %

## 2018-10-21 LAB — APTT: APTT: 29 s (ref 24–36)

## 2018-10-21 MED ORDER — SODIUM CHLORIDE 0.9% FLUSH
3.0000 mL | Freq: Once | INTRAVENOUS | Status: DC
Start: 2018-10-21 — End: 2018-10-24

## 2018-10-21 NOTE — ED Triage Notes (Signed)
Pt presents with HA, generalized weakness. Episodes today of forgetting names of co workers, some dizziness. LVO -, pt using inappropriate words in sentences, speech with clear, no facial droop, A & O. Follows commands.  Pt states he began to feel "off" this am @ 9-10am. Pt does have hx of ischemic strokes.

## 2018-10-21 NOTE — Consult Note (Signed)
Neurology Consultation Reason for Consult: Speech disturbance Referring Physician: Dr. Rhunette Croft CC: Speech problem History is obtained from: Patient, chart  HPI: Thomas Burch is a 33 y.o. 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 fib/hypercoagulable states, presents to the emergency room for evaluation of an episode of speech disturbance that happened this morning. He was at work and at around 11 AM had a 15 to 20-minute episode where he could not bring his words out.  He was able to say single lines but not complete sentences this lasted for the 20 minutes duration.  After that he was back to his baseline. Since he had a stroke in the past, he made a trip to the ER to get checked out because the symptoms were sudden onset and focal neurological and he had been told about red flag symptoms of strokes. On my examination, see detailed exam below, he was completely nonfocal.  LKW: 11 AM on 10/21/2018 tpa given?: no, outside the window, NIH 0 Premorbid modified Rankin scale (mRS):0  ROS: ROS was performed and is negative except as noted in the HPI.  Past Medical History:  Diagnosis Date  . Diabetes mellitus type 2 with complications (HCC)   . Headache   . Hypertension   . Stroke (HCC)    Infarct of left cingulate gyrus   Family History  Problem Relation Age of Onset  . Hypertension Father   . Stroke Father   . Hyperlipidemia Father   . Diabetes Maternal Grandmother    Social History:   reports that he has never smoked. He has never used smokeless tobacco. He reports that he does not drink alcohol or use drugs.  Medications  Current Facility-Administered Medications:  .  sodium chloride flush (NS) 0.9 %  injection 3 mL, 3 mL, Intravenous, Once, Nanavati, Ankit, MD  Current Outpatient Medications:  .  amLODipine (NORVASC) 10 MG tablet, Take 1 tablet (10 mg total) by mouth daily., Disp: 90 tablet, Rfl: 3 .  cholecalciferol (VITAMIN D) 25 MCG (1000 UT) tablet, Take 1,000 Units by mouth daily., Disp: , Rfl:  .  cloNIDine (CATAPRES) 0.2 MG tablet, Take 1 tablet (0.2 mg total) by mouth daily., Disp: 90 tablet, Rfl: 3 .  clopidogrel (PLAVIX) 75 MG tablet, Take 1 tablet (75 mg total) by mouth daily., Disp: 90 tablet, Rfl: 3 .  LANTUS SOLOSTAR 100 UNIT/ML Solostar Pen, INJECT 13 UNITS SUBCUTANEOUSLY INTO THE SKIN EVERY MORNING (Patient taking differently: Inject 13 Units into the skin daily. ), Disp: 15 mL, Rfl: 0 .  lisinopril (PRINIVIL,ZESTRIL) 20 MG tablet, Take 1 tablet (20 mg total) by mouth 2 (two) times daily. (Patient taking differently: Take 20 mg by mouth daily. ), Disp: 90 tablet, Rfl: 0 .  MENAQUINONE-7 PO, Take 90 mcg by mouth daily., Disp: , Rfl:  .  PRESCRIPTION MEDICATION, Take 500 mg by mouth daily. Metformin IR or XR, Disp: , Rfl:  .  atorvastatin (LIPITOR) 80 MG tablet, Take 1 tablet (80 mg total) by mouth daily at 6 PM., Disp: 90 tablet, Rfl: 1 .  metFORMIN (GLUCOPHAGE) 500 MG tablet, Take 1 tablet (500 mg total) by mouth 2 (two) times daily with a meal. (Patient not taking: Reported on 10/21/2018), Disp: 60 tablet, Rfl: 0  Exam: Current  vital signs: BP (!) 156/89   Pulse (!) 111   Temp 99 F (37.2 C) (Oral)   Resp (!) 24   Ht 5\' 10"  (1.778 m)   Wt 99.8 kg   SpO2 99%   BMI 31.57 kg/m  Vital signs in last 24 hours: Temp:  [99 F (37.2 C)] 99 F (37.2 C) (02/16 2130) Pulse Rate:  [111-120] 111 (02/16 2300) Resp:  [21-24] 24 (02/16 2300) BP: (156-175)/(89-116) 156/89 (02/16 2300) SpO2:  [99 %] 99 % (02/16 2300) Weight:  [99.8 kg] 99.8 kg (02/16 2202) GENERAL: Awake, alert in NAD HEENT: - Normocephalic and atraumatic, dry mm, no LN++, no Thyromegally LUNGS - Clear to  auscultation bilaterally with no wheezes CV - S1S2 RRR, no m/r/g, equal pulses bilaterally. ABDOMEN - Soft, nontender, nondistended with normoactive BS Ext: warm, well perfused, intact peripheral pulses,no edema NEURO:  Mental Status: AA&Ox3  Language: speech is not dysarthric.  Naming, repetition, fluency, and comprehension intact. Cranial Nerves: PERRL. EOMI, visual fields full, no facial asymmetry, facial sensation intact, hearing intact, tongue/uvula/soft palate midline, normal sternocleidomastoid and trapezius muscle strength. No evidence of tongue atrophy or fibrillations Motor: 5/5 in all four extremity Tone: is normal and bulk is normal Sensation- Intact to light touch bilaterally Coordination: FTN intact bilaterally, no ataxia in BLE. Gait- deferred NIHSS-0  Labs I have reviewed labs in epic and the results pertinent to this consultation are:  CBC    Component Value Date/Time   WBC 15.3 (H) 10/21/2018 2159   RBC 5.47 10/21/2018 2159   HGB 15.4 10/21/2018 2159   HCT 48.1 10/21/2018 2159   PLT 295 10/21/2018 2159   MCV 87.9 10/21/2018 2159   MCH 28.2 10/21/2018 2159   MCHC 32.0 10/21/2018 2159   RDW 12.0 10/21/2018 2159   LYMPHSABS 1.8 10/21/2018 2159   MONOABS 0.7 10/21/2018 2159   EOSABS 0.0 10/21/2018 2159   BASOSABS 0.1 10/21/2018 2159   CMP     Component Value Date/Time   NA 132 (L) 10/21/2018 2159   NA 141 05/22/2017 1458   K 4.6 10/21/2018 2159   CL 97 (L) 10/21/2018 2159   CO2 23 10/21/2018 2159   GLUCOSE 283 (H) 10/21/2018 2159   BUN 20 10/21/2018 2159   BUN 17 05/22/2017 1458   CREATININE 1.12 10/21/2018 2159   CREATININE 1.17 02/01/2014 0940   CALCIUM 9.7 10/21/2018 2159   PROT 7.2 10/21/2018 2159   ALBUMIN 4.2 10/21/2018 2159   AST 26 10/21/2018 2159   ALT 47 (H) 10/21/2018 2159   ALKPHOS 65 10/21/2018 2159   BILITOT 1.0 10/21/2018 2159   GFRNONAA >60 10/21/2018 2159   GFRAA >60 10/21/2018 2159   Imaging I have reviewed the images  obtained: CT-scan of the brain - no acute changes. Stigmata of old stroke in the left thalamus/posterior internal capsule.  Stroke lab studies from prior admission.  2018. From 2018, transthoracic echo showed some focal basal hypertrophy in the left ventricle with systolic function being normal and LVEF 50 to 50%, pseudo-normal left ventricular filling pattern with concomitant abnormal relaxation and increasing filling pressures suggestive of grade 2 diastolic dysfunction. Carotid Dopplers 1 to 39% ICA stenosis bilaterally. TCD with bubble study with no hits. TEE done at that point showed low normal LV function 45 to 50%, no left atrial appendage or left atrium thrombi, no evidence of PFO. CSF studies showed elevated glucose and normal protein.  No evidence of vasculitis. Hypercoagulable work-up in young-negative. Hemoglobin A1c at that time was  11.9.   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 fib/hypercoagulable states, presents to the emergency room for evaluation of an episode of speech disturbance that happened this morning.f. No slurred speech of focal weakness numbness tingling. ABCD 2 score is 3.  Given the history of an old stroke in a very young age and no clear cause, the symptoms are concerning for stroke or TIA. At this point, recommend further imaging  Impression: Evaluate for stroke  Recommendations: MRI brain without contrast stat If positive for stroke, admit for stroke work-up If negative, follow-up with outpatient neurology. Continue with home antiplatelets Relayed plan to ED provider We will follow-up after imaging.  -- Milon Dikes, MD [facility] Pager:  [phone] If 7pm to 7am, please call on call as listed on AMION.  Addendum 0230 hrs MRI of the brain without contrast completed.  It shows multiple areas of restricted diffusion- small punctate in left occipital and right frontal. This raises suspicion for some sort of an embolic phenomenon. He has been worked up extensively in the past but I would recommend that he be admitted for further work-up.  Assessment:  #Acute ischemic stroke in a young patient, etiology remains elusive.  This could all be either severe small vessel disease or embolic phenomenon.  Extensive work-up in the past has been unremarkable including hypercoagulable work-up. #Diabetes  Updated recommendations as below: - Telemetry - Might need long-term cardiac monitoring-loop recorder - 2D echo, might need TEE again -Consider TCD with bubble study - Hemoglobin A1c, lipid panel -Frequent neurochecks -Continue with aspirin Plavix for now -High-dose statin-atorvastatin 80 -Urinary toxicology screen -There was consideration for performing a sleep study but I do not see results.  Consider outpatient sleep study. Stroke team will follow with you   -- Milon Dikes, MD [facility] Pager: [phone] If 7pm to 7am, please call on call as listed on AMION.

## 2018-10-22 ENCOUNTER — Encounter (HOSPITAL_COMMUNITY): Payer: Commercial Managed Care - PPO

## 2018-10-22 ENCOUNTER — Observation Stay (HOSPITAL_COMMUNITY): Payer: Commercial Managed Care - PPO

## 2018-10-22 ENCOUNTER — Other Ambulatory Visit: Payer: Self-pay

## 2018-10-22 ENCOUNTER — Encounter (HOSPITAL_COMMUNITY): Payer: Self-pay | Admitting: Family Medicine

## 2018-10-22 ENCOUNTER — Emergency Department (HOSPITAL_COMMUNITY): Payer: Commercial Managed Care - PPO

## 2018-10-22 DIAGNOSIS — J0100 Acute maxillary sinusitis, unspecified: Secondary | ICD-10-CM | POA: Diagnosis present

## 2018-10-22 DIAGNOSIS — G4733 Obstructive sleep apnea (adult) (pediatric): Secondary | ICD-10-CM | POA: Diagnosis present

## 2018-10-22 DIAGNOSIS — Z7902 Long term (current) use of antithrombotics/antiplatelets: Secondary | ICD-10-CM | POA: Diagnosis not present

## 2018-10-22 DIAGNOSIS — Z8673 Personal history of transient ischemic attack (TIA), and cerebral infarction without residual deficits: Secondary | ICD-10-CM | POA: Diagnosis not present

## 2018-10-22 DIAGNOSIS — R4701 Aphasia: Secondary | ICD-10-CM | POA: Diagnosis present

## 2018-10-22 DIAGNOSIS — E1051 Type 1 diabetes mellitus with diabetic peripheral angiopathy without gangrene: Secondary | ICD-10-CM | POA: Diagnosis present

## 2018-10-22 DIAGNOSIS — Z9114 Patient's other noncompliance with medication regimen: Secondary | ICD-10-CM | POA: Diagnosis not present

## 2018-10-22 DIAGNOSIS — E785 Hyperlipidemia, unspecified: Secondary | ICD-10-CM | POA: Diagnosis present

## 2018-10-22 DIAGNOSIS — Z6831 Body mass index (BMI) 31.0-31.9, adult: Secondary | ICD-10-CM | POA: Diagnosis not present

## 2018-10-22 DIAGNOSIS — Z794 Long term (current) use of insulin: Secondary | ICD-10-CM | POA: Diagnosis not present

## 2018-10-22 DIAGNOSIS — I639 Cerebral infarction, unspecified: Secondary | ICD-10-CM

## 2018-10-22 DIAGNOSIS — I10 Essential (primary) hypertension: Secondary | ICD-10-CM | POA: Diagnosis present

## 2018-10-22 DIAGNOSIS — E1165 Type 2 diabetes mellitus with hyperglycemia: Secondary | ICD-10-CM | POA: Diagnosis not present

## 2018-10-22 DIAGNOSIS — Z79899 Other long term (current) drug therapy: Secondary | ICD-10-CM | POA: Diagnosis not present

## 2018-10-22 DIAGNOSIS — R9431 Abnormal electrocardiogram [ECG] [EKG]: Secondary | ICD-10-CM | POA: Diagnosis present

## 2018-10-22 DIAGNOSIS — Z833 Family history of diabetes mellitus: Secondary | ICD-10-CM | POA: Diagnosis not present

## 2018-10-22 DIAGNOSIS — Z9119 Patient's noncompliance with other medical treatment and regimen: Secondary | ICD-10-CM | POA: Diagnosis not present

## 2018-10-22 DIAGNOSIS — R471 Dysarthria and anarthria: Secondary | ICD-10-CM | POA: Diagnosis present

## 2018-10-22 DIAGNOSIS — E1065 Type 1 diabetes mellitus with hyperglycemia: Secondary | ICD-10-CM | POA: Diagnosis present

## 2018-10-22 DIAGNOSIS — Z823 Family history of stroke: Secondary | ICD-10-CM | POA: Diagnosis not present

## 2018-10-22 DIAGNOSIS — R29700 NIHSS score 0: Secondary | ICD-10-CM | POA: Diagnosis present

## 2018-10-22 DIAGNOSIS — I6381 Other cerebral infarction due to occlusion or stenosis of small artery: Secondary | ICD-10-CM | POA: Diagnosis present

## 2018-10-22 DIAGNOSIS — Z8249 Family history of ischemic heart disease and other diseases of the circulatory system: Secondary | ICD-10-CM | POA: Diagnosis not present

## 2018-10-22 DIAGNOSIS — Z8349 Family history of other endocrine, nutritional and metabolic diseases: Secondary | ICD-10-CM | POA: Diagnosis not present

## 2018-10-22 DIAGNOSIS — I672 Cerebral atherosclerosis: Secondary | ICD-10-CM | POA: Diagnosis present

## 2018-10-22 LAB — TROPONIN I
Troponin I: <0.03 ng/mL (ref <0.03)
Troponin I: <0.03 ng/mL (ref <0.03)
Troponin I: <0.03 ng/mL (ref <0.03)

## 2018-10-22 LAB — BASIC METABOLIC PANEL
Anion gap: 10 (ref 5–15)
BUN: 14 mg/dL (ref 6–20)
CO2: 26 mmol/L (ref 22–32)
CREATININE: 0.98 mg/dL (ref 0.61–1.24)
Calcium: 9.1 mg/dL (ref 8.9–10.3)
Chloride: 99 mmol/L (ref 98–111)
GFR calc Af Amer: >60 mL/min (ref >60)
GFR calc non Af Amer: >60 mL/min (ref >60)
Glucose, Bld: 220 mg/dL — ABNORMAL HIGH (ref 70–99)
Potassium: 4 mmol/L (ref 3.5–5.1)
Sodium: 135 mmol/L (ref 135–145)

## 2018-10-22 LAB — RAPID URINE DRUG SCREEN, HOSP PERFORMED
Amphetamines: NOT DETECTED
BARBITURATES: NOT DETECTED
Benzodiazepines: NOT DETECTED
Cocaine: NOT DETECTED
OPIATES: NOT DETECTED
Tetrahydrocannabinol: NOT DETECTED

## 2018-10-22 LAB — GLUCOSE, CAPILLARY
Glucose-Capillary: 211 mg/dL — ABNORMAL HIGH (ref 70–99)
Glucose-Capillary: 256 mg/dL — ABNORMAL HIGH (ref 70–99)
Glucose-Capillary: 183 mg/dL — ABNORMAL HIGH (ref 70–99)
Glucose-Capillary: 172 mg/dL — ABNORMAL HIGH (ref 70–99)

## 2018-10-22 LAB — CBC
HCT: 45.7 % (ref 39.0–52.0)
Hemoglobin: 15.2 g/dL (ref 13.0–17.0)
MCH: 29.2 pg (ref 26.0–34.0)
MCHC: 33.3 g/dL (ref 30.0–36.0)
MCV: 87.9 fL (ref 80.0–100.0)
PLATELETS: 318 10*3/uL (ref 150–400)
RBC: 5.2 MIL/uL (ref 4.22–5.81)
RDW: 12.1 % (ref 11.5–15.5)
WBC: 11.9 10*3/uL — ABNORMAL HIGH (ref 4.0–10.5)
nRBC: 0 % (ref 0.0–0.2)

## 2018-10-22 LAB — ECHOCARDIOGRAM COMPLETE
HEIGHTINCHES: 70 in
Weight: 3520 oz

## 2018-10-22 LAB — LIPID PANEL
Cholesterol: 175 mg/dL (ref 0–200)
HDL: 32 mg/dL — ABNORMAL LOW (ref >40)
LDL Cholesterol: 118 mg/dL — ABNORMAL HIGH (ref 0–99)
Total CHOL/HDL Ratio: 5.5 RATIO
Triglycerides: 125 mg/dL (ref <150)
VLDL: 25 mg/dL (ref 0–40)

## 2018-10-22 LAB — HIV ANTIBODY (ROUTINE TESTING W REFLEX): HIV Screen 4th Generation wRfx: NONREACTIVE

## 2018-10-22 MED ORDER — ASPIRIN 300 MG RE SUPP: 300.0000 mg | Freq: Every day | RECTAL | Status: DC

## 2018-10-22 MED ORDER — ACETAMINOPHEN 160 MG/5ML PO SOLN: 650.0000 mg | ORAL | Status: DC | PRN

## 2018-10-22 MED ORDER — ATORVASTATIN CALCIUM 80 MG PO TABS
80.0000 mg | ORAL_TABLET | Freq: Every day | ORAL | Status: DC
  Administered 2018-10-22 – 2018-10-23 (×2): 80 mg via ORAL
  Filled 2018-10-22 (×2): qty 1

## 2018-10-22 MED ORDER — LORAZEPAM 2 MG/ML IJ SOLN
INTRAMUSCULAR | Status: AC
  Filled 2018-10-22: qty 1

## 2018-10-22 MED ORDER — CLOPIDOGREL BISULFATE 75 MG PO TABS
75.0000 mg | ORAL_TABLET | Freq: Every day | ORAL | Status: DC
  Administered 2018-10-22 – 2018-10-24 (×3): 75 mg via ORAL
  Filled 2018-10-22 (×3): qty 1

## 2018-10-22 MED ORDER — SODIUM CHLORIDE 0.9 % IV SOLN: INTRAVENOUS | Status: DC

## 2018-10-22 MED ORDER — INSULIN ASPART 100 UNIT/ML ~~LOC~~ SOLN
0.0000 [IU] | Freq: Three times a day (TID) | SUBCUTANEOUS | Status: DC
  Administered 2018-10-22: 8 [IU] via SUBCUTANEOUS
  Administered 2018-10-22: 3 [IU] via SUBCUTANEOUS
  Administered 2018-10-22: 5 [IU] via SUBCUTANEOUS
  Administered 2018-10-23: 8 [IU] via SUBCUTANEOUS
  Administered 2018-10-23: 3 [IU] via SUBCUTANEOUS
  Administered 2018-10-23: 5 [IU] via SUBCUTANEOUS
  Administered 2018-10-24: 8 [IU] via SUBCUTANEOUS
  Administered 2018-10-24: 3 [IU] via SUBCUTANEOUS

## 2018-10-22 MED ORDER — LORAZEPAM 2 MG/ML IJ SOLN
1.0000 mg | Freq: Once | INTRAMUSCULAR | Status: AC
  Administered 2018-10-22: 1 mg via INTRAVENOUS

## 2018-10-22 MED ORDER — ASPIRIN 325 MG PO TABS
325.0000 mg | ORAL_TABLET | Freq: Every day | ORAL | Status: DC
  Administered 2018-10-22 – 2018-10-24 (×3): 325 mg via ORAL
  Filled 2018-10-22 (×3): qty 1

## 2018-10-22 MED ORDER — IOPAMIDOL (ISOVUE-370) INJECTION 76%
100.0000 mL | Freq: Once | INTRAVENOUS | Status: AC | PRN
  Administered 2018-10-22: 75 mL via INTRAVENOUS

## 2018-10-22 MED ORDER — SENNOSIDES-DOCUSATE SODIUM 8.6-50 MG PO TABS: 1.0000 | ORAL_TABLET | Freq: Every evening | ORAL | Status: DC | PRN

## 2018-10-22 MED ORDER — INSULIN GLARGINE 100 UNIT/ML ~~LOC~~ SOLN
10.0000 [IU] | Freq: Every day | SUBCUTANEOUS | Status: DC
  Administered 2018-10-22 – 2018-10-23 (×2): 10 [IU] via SUBCUTANEOUS
  Filled 2018-10-22 (×3): qty 0.1

## 2018-10-22 MED ORDER — ENSURE MAX PROTEIN PO LIQD
11.0000 [oz_av] | Freq: Two times a day (BID) | ORAL | Status: DC
  Administered 2018-10-22 – 2018-10-24 (×4): 11 [oz_av] via ORAL
  Filled 2018-10-22 (×6): qty 330

## 2018-10-22 MED ORDER — INSULIN ASPART 100 UNIT/ML ~~LOC~~ SOLN: 0.0000 [IU] | Freq: Every day | SUBCUTANEOUS | Status: DC

## 2018-10-22 MED ORDER — ACETAMINOPHEN 325 MG PO TABS
650.0000 mg | ORAL_TABLET | ORAL | Status: DC | PRN
  Administered 2018-10-23 – 2018-10-24 (×2): 650 mg via ORAL
  Filled 2018-10-22 (×2): qty 2

## 2018-10-22 MED ORDER — STROKE: EARLY STAGES OF RECOVERY BOOK
Freq: Once | Status: AC
  Administered 2018-10-22: 05:00:00
  Filled 2018-10-22: qty 1

## 2018-10-22 MED ORDER — IOPAMIDOL (ISOVUE-370) INJECTION 76%
75.0000 mL | Freq: Once | INTRAVENOUS | Status: AC | PRN
  Administered 2018-10-22: 75 mL via INTRAVENOUS

## 2018-10-22 MED ORDER — ENOXAPARIN SODIUM 40 MG/0.4ML ~~LOC~~ SOLN
40.0000 mg | SUBCUTANEOUS | Status: DC
  Administered 2018-10-22 – 2018-10-24 (×3): 40 mg via SUBCUTANEOUS
  Filled 2018-10-22 (×3): qty 0.4

## 2018-10-22 MED ORDER — ACETAMINOPHEN 650 MG RE SUPP: 650.0000 mg | RECTAL | Status: DC | PRN

## 2018-10-22 NOTE — Progress Notes (Signed)
FTPS Interim Progress Note:   Patient admitted earlier this morning for nonhemorrhagic, ischemic CVA.  Evaluated patient at bedside.  He states he is feeling much better, no longer has any difficulty with his speech.  Denies any chest pain, shortness of breath, weakness/tingling, or numbness.  He is aware that he has had several previous CVAs in the past, is motivated to work on improving his diabetes and additional risk factors.  Blood pressure (!) 143/98, pulse (!) 106, temperature 98.1 F (36.7 C), temperature source Oral, resp. rate 17, height 5\' 10"  (1.778 m), weight 99.8 kg, SpO2 98 %. General: Alert, NAD, larger gentleman, appears older than stated age HEENT: NCAT, MMM Cardiac: RRR no m/g/r Lungs: Clear bilaterally, no increased WOB  Abdomen: soft, non-tender, non-distended, normoactive BS Msk: Moves all extremities spontaneously Neuro: Alert and oriented, no focal neuro deficits.  Speech understandable and appropriate, can hold conversation.  EOMI. Ext: Warm, dry, 2+ distal pulses, no edema   Assessment/plan:  Dysarthria secondary to CVA: Acute, resolved.  Back to baseline, neurologically intact.  8 mm acute ischemic left occipital lobe infarct and 4 mm subacute infarct in right centrum semiovale noted on MRI.  LDL 118.  UDS negative.  While patient has several risk factors including uncontrolled diabetes, hyperlipidemia, and BMI>30, would not expect someone of his age to already have several CVAs.  Fortunately, has had extensive work-up in the past, including hypercoagulability which was negative. - Neurology on board, appreciate continue recommendations - Follow-up echocardiogram, carotid Dopplers - Continue aspirin 325 and Plavix 75 mg -Continue atorvastatin 80 mg -Continue to allow for permissive hypertension - Follow-up A1c - Consider outpatient sleep study, long-term loop recorder, and TCD bubble study  Abnormal EKG on admit: Patient without chest pain or anginal symptoms.   Troponin negative X1.   -Repeat EKG this a.m.  Allayne Stack, DO

## 2018-10-22 NOTE — Progress Notes (Addendum)
STROKE TEAM PROGRESS NOTE   INTERVAL HISTORY His RN and CM is at the bedside.  He works as a Merchandiser, retailsupervisor at [employer] - there he noted onset speech difficulty which resolved after 5-6 hrs. He went home and took a nap, when he woke he had a HA. At that time his speech output was not right for a few hours.   Vitals:   10/22/18 0300 10/22/18 0426 10/22/18 0630 10/22/18 0841  BP: (!) 132/94 129/87 131/89 (!) 143/98  Pulse: (!) 104 87  (!) 106  Resp: 20  18 17   Temp:  98.2 F (36.8 C) 98.2 F (36.8 C) 98.1 F (36.7 C)  TempSrc:  Oral Oral Oral  SpO2: 92% 94% 97% 98%  Weight:      Height:        CBC:  Recent Labs  Lab 10/21/18 2159 10/22/18 0645  WBC 15.3* 11.9*  NEUTROABS 12.8*  --   HGB 15.4 15.2  HCT 48.1 45.7  MCV 87.9 87.9  PLT 295 318    Basic Metabolic Panel:  Recent Labs  Lab 10/21/18 2159 10/22/18 0645  NA 132* 135  K 4.6 4.0  CL 97* 99  CO2 23 26  GLUCOSE 283* 220*  BUN 20 14  CREATININE 1.12 0.98  CALCIUM 9.7 9.1   Lipid Panel:     Component Value Date/Time   CHOL 175 10/22/2018 0645   TRIG 125 10/22/2018 0645   HDL 32 (L) 10/22/2018 0645   CHOLHDL 5.5 10/22/2018 0645   VLDL 25 10/22/2018 0645   LDLCALC 118 (H) 10/22/2018 0645   HgbA1c:  Lab Results  Component Value Date   HGBA1C 11.9 (H) 04/29/2017   Urine Drug Screen:     Component Value Date/Time   LABOPIA NONE DETECTED 10/22/2018 0327   COCAINSCRNUR NONE DETECTED 10/22/2018 0327   LABBENZ NONE DETECTED 10/22/2018 0327   AMPHETMU NONE DETECTED 10/22/2018 0327   THCU NONE DETECTED 10/22/2018 0327   LABBARB NONE DETECTED 10/22/2018 0327    Alcohol Level     Component Value Date/Time   ETH <5 05/11/2017 2257    IMAGING Ct Head Wo Contrast  Result Date: 10/21/2018 CLINICAL DATA:  Severe headache, brief memory loss, elevated blood pressure. EXAM: CT HEAD WITHOUT CONTRAST TECHNIQUE: Contiguous axial images were obtained from the base of the skull through the vertex without intravenous  contrast. COMPARISON:  Head CT dated 05/11/2017 and brain MRI dated 05/12/2017. FINDINGS: Brain: Old infarct within the LEFT thalamus. No mass, hemorrhage, edema or other evidence of acute parenchymal abnormality. No extra-axial hemorrhage. Vascular: No hyperdense vessel or unexpected calcification. Skull: Normal. Negative for fracture or focal lesion. Sinuses/Orbits: Fluid levels and mucosal thickening within the bilateral maxillary sinuses. Periorbital and retro-orbital soft tissues are unremarkable. Other: None. IMPRESSION: 1. No acute intracranial abnormality. No intracranial mass, hemorrhage or edema. 2. Old infarct within the LEFT thalamus. 3. Maxillary sinus disease, of uncertain chronicity. Electronically Signed   By: Bary RichardStan  Maynard M.D.   On: 10/21/2018 22:36   Thomas Brain Wo Contrast  Result Date: 10/22/2018 CLINICAL DATA:  Initial evaluation for acute headache, generalized weakness, dizziness. EXAM: MRI HEAD WITHOUT CONTRAST TECHNIQUE: Multiplanar, multiecho pulse sequences of the brain and surrounding structures were obtained without intravenous contrast. COMPARISON:  Prior CT from 10/21/2018 FINDINGS: Brain: Generalized age-appropriate cerebral volume. Mild chronic micro vessel ischemic changes present within the periventricular and deep white matter both cerebral hemispheres. Few small remote lacunar infarcts present within the bilateral basal ganglia and thalami. 8  mm acute ischemic infarct present at the left occipital lobe, left PCA territory (series 5, image 64). No associated hemorrhage or mass effect. Probable additional early subacute 4 mm small vessel type infarcts seen involving the posterior right centrum semi ovale (series 5, image 84). No other evidence for acute or recent infarction. No encephalomalacia to suggest chronic cortical infarction. No foci of susceptibility artifact to suggest acute or chronic intracranial hemorrhage. No mass lesion, midline shift or mass effect. No  hydrocephalus. No extra-axial fluid collection. Pituitary gland normal. Vascular: Major intracranial vascular flow voids maintained. Skull and upper cervical spine: Craniocervical junction normal. Upper cervical spine normal. Bone marrow signal intensity within normal limits. No scalp soft tissue abnormality. Sinuses/Orbits: Globes and orbital soft tissues within normal limits. Mucosal thickening with air-fluid levels present within the maxillary sinuses bilaterally, compatible with acute sinusitis. Small left mastoid effusion noted, of doubtful significance. Inner ear structures grossly normal. Other: None. IMPRESSION: 1. 8 mm acute ischemic nonhemorrhagic left occipital lobe infarct. 2. Additional 4 mm early subacute ischemic nonhemorrhagic small vessel type infarct involving the right centrum semi ovale. 3. Underlying mild chronic microvascular ischemic disease with scattered remote lacunar infarcts involving the bilateral basal ganglia and thalami. 4. Acute maxillary sinusitis. Electronically Signed   By: Rise Mu M.D.   On: 10/22/2018 02:06    PHYSICAL EXAM  obese young Caucasian male not in distress. . Afebrile. Head is nontraumatic. Neck is supple without bruit.    Cardiac exam no murmur or gallop. Lungs are clear to auscultation. Distal pulses are well felt. Neurological Exam ;  Awake  Alert oriented x 3. Normal speech and language.eye movements full without nystagmus.fundi were not visualized. Vision acuity and fields appear normal. Hearing is normal. Palatal movements are normal. Face symmetric. Tongue midline. Normal strength, tone, reflexes and coordination. Normal sensation. Gait deferred.   ASSESSMENT/PLAN Thomas. DEWEY Burch is a 33 y.o. male with history of DB, prior stroke 2018 presenting with speech disturbance.   Stroke:  left occipital and right centrum semiovale  Infarcts secondary to small vessel disease source in setting on uncontrolled risk factors  CT head  No  acute stroke. 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 Maxillary sinus dz.     MRI  L occipital infarct. subacute R CSO infarct.  Small vessel disease. Old B BG and thalamic lacunes. Acute maxillary sinusitis  CTA head & neck no significant stenosis.  Mild irregularity R > L M2 and M3 branches bilaterally  2D Echo  pending   LDL 118  HgbA1c 11.9  Lovenox 40 mg sq daily for VTE prophylaxis  No antithrombotic (prescribed but he ran out and did not get refilled) prior to admission, now on aspirin 325 mg daily and clopidogrel 75 mg daily x 3 months then ASPIRIN alone (given IC stenosis)  Therapy recommendations:  No therapy needs  Disposition:  Return home  Hypertension  Stable . BP goal normotensive  Hyperlipidemia  Home meds:  lipitor 80, resumed in hospital  LDL 118, goal < 70  Continue statin at discharge  Diabetes type II  HgbA1c 11.9, goal < 7.0  Uncontrolled  Other Stroke Risk Factors  Obesity, Body mass index is 31.57 kg/m., recommend weight loss, diet and exercise as appropriate   Hx stroke/TIA  05/2017 - Stroke: Acute/subacute infarct left anterior thalamus and posterior internal capsule as noted on recent CT. 5 mm acute infarct left cingulate gyrus, secondary to small vessel disease, in the setting of poorly controlled hypertension, previously untreated diabetes, and  recent stroke  04/2017 - Stroke:  Right CR, right temporal and left splenium punctate infarcts, embolic work up so far negative. Could be synchronized small vessel disease, however, recommend 30 day cardiac event monitoring as outpt to rule out afib.  Family hx stroke (father)  [facility] - Patient interested in participating in the SLEEP SMART STROKE   trial. [facility] will follow up for possible enrollment. Please contact them at [phone] for any questions.    Other Active Problems  Abn EKG, trop neg  Hospital day # 0  Annie Main, MSN, APRN, ANVP-BC, AGPCNP-BC Advanced  Practice Stroke Nurse [facility] See Amion for Schedule & Pager information 10/22/2018 3:26 PM  I have personally obtained history,examined this patient, reviewed notes, independently viewed imaging studies, participated in medical decision making and plan of care.ROS completed by me personally and pertinent positives fully documented  I have made any additions or clarifications directly to the above note. Agree with note above.presented with transient episode of speech disturbance and MRI scan shows a corner new infarcts. He is had prior presentations with strokes with extensive negative workup for cardiac source of embolism, A. Fib, hypercoagulable state, vasculitis, PFO and his previous strokes have been felt to be secondary to small vessel disease with uncontrolled risk factors. He appears to be at high risk for sleep apnea and is interested in participation in the sleep smart stroke trial.continue dual antiplatelet therapy and aggressive risk factor modification Greater than 50% time during this 35 minute visit was spent on counseling and coordination of care about his multiple lacunar infarcts and discussion about stroke treatment, prevention and answering questions Delia Heady, MD Medical Director [facility] Pager: [phone] 10/22/2018 4:44 PM  To contact Stroke Continuity provider, please refer to [email or web address]. After hours, contact General Neurology

## 2018-10-22 NOTE — Progress Notes (Signed)
Received pt from ED, alert and oriented, placed on Tele and verified, on continuous pulse ox, continue to monitor, notified MD of his diet order but placed dietary consult

## 2018-10-22 NOTE — H&P (Addendum)
Family Medicine Teaching [facility] Admission History and Physical Service Pager: [phone]  Patient name: [name] Medical record number: [id number] Date of birth: February 29, 1986 Age: 33 y.o. Gender: male  Primary Care Provider: Shirley, [place], DO Consultants: Neurology Code Status: Prior   Chief Complaint: Altered Mental Status   Assessment and Plan: Thomas Burch is a 33 y.o. male admitted for CVA.  His chronic conditions include poorly controlled DM2, HLD, Obesity.   # CVA - Nonhemorrhagic, ischemic  L occipital lobe, R centrum semi ovale, B/L basal ganglia and thalami. Presenting with symptoms of difficulty with speech. MRI brain showing 48mm acute ischemic nonhemorrhagic left occipital lobe infarct and additional 2mm subaute ischemic non hemorrhagic small vessel type infarct involving right centrum semi ovale. Neurology consulted in ED recommended admission for stroke w/u.  Previous admission in 04/2017 with AMS symptoms of word finding and confusion and stroke to L occipital cortex, subacute infarcts to R cerebrum and mild small vessel ischemia. CTA neck 04/2017 with mild atherosclerosis, CTA head 8/2018with no large vessel occlusion. Severe stenosis R proximalP2 segment. Moderatestenosis R P1 segment. Mild cerebral artery luminal irregularity compatible with atherosclerosis. Carotid doppler and TEE normal. TTE with EF 50-55% w/ grade 2 DD. Stenosis previously treated with medical management per neurology recs. Loop monitor was also recommended, but patient did not follow up. Patient was admitted again on 05/11/2017 and found to have acute/subacute infarct of left anterior thalamus and posterior internal capsule. Previous strokes thought to be 2/2 uncontrolled risk factors. Last seen by outpatient neurology on 12/2017. Instructed to f/u in 3 months.  Given findings from 2018 stroke work ups, will obtain repeat CTA head and neck, carotid dopplers and TTE as patient has been noncompliant  with medical management.  . Admit to FMTS, attending Dr. Perley Jain. Level of care: Med-tele.  Marland Kitchen Neuro Stroke team c/s'ed in ED, appreciate recs . Risk stratification labs: A1C, Lipid Panel . Imaging: TTE, Carotid dopplers, CTA head and neck . SLP, PT, OT  . Rx o ASA 325 + plavix 75 o Continue atorvastatin 80mg  . NPO - please page MD when pt passes stroke swallow screen . Neuro Checks q2 hours x 12-24 hours . AM CBC/BMP . Permissive HTN to 220/120  o If systolic >220 or diastolic >120, PRN 20mg  labetalol   #Abnormal EKG  Signed per ED provider with possible inferior infarct. Patient denies chest pain at this time. Patient with uncontrolled risk factors.   Troponin x3 q 6 hours  Repeat AM EKG  # Leukocytosis  ANC 12.8. Acute maxillary sinusitis, but asymptomatic. Likely stress response.  . If symptomatic, can provide supportive care PRN  . Monitor for other clinical signs of infection   Chronic Issues # DM II  At home, supposed to be on metformin 500mg , lantus 13u QAM, lisinopril 20mg , and atorvastatin 80mg . H/o noncompliance. Has not seen PCP since 2018 . Moderate SSI . F/u Hgb A1C  # HTN Home medications: lisinopril 20mg , clonidine, amlodipine 10mg . Blood pressures coming decreasing. On arrival to the ED, 175/116. Most recent 129/87. H/o noncompliance as above.  . Hold home HTN medications in setting of permissive HTN  # HLD Home atorvastatin 80mg   . F/u Lipid panel . Continue atorvastatin 80mg   # Hx CVA At home, on plavix 75mg . Discontinued ASA in April 2019 per neurology.  . Continue home plavix.  . Starting ASA 325mg .  # Obesity  . Nutrition consult   #Medication noncompliance   Consult social work if found to  be 2/2 medication affordability   #FEN/GI:  . Fluids: none  . Electrolytes: wnl  . Nutrition:  NPO  Access: R hand PIV VTE prophylaxis: Lovenox 40mg    Disposition: Admit to telemetry  History of the Present Illness  Thomas Burch is a 33  y.o. male who presents with aphasia found to have L occipital lobe ischemic stroke on MRI.  Patient reports that he was at work this morning and was fine until about 11:00.  He reports that he was speaking to a coworker but was not speaking clearly.  He felt like he had a hard time getting words out but also new that what he was saying did not make sense.  He denies any weakness, slurred speech or difficulty with ambulation or motor function at this time.  He reports that he went home and noticed that his blood pressure was elevating throughout the early afternoon.  He took a nap for about 3 to 4 hours and saw his blood pressure was still elevated and called "my people" and then came to hospital.  While explaining the events of the morning, patient did have a few episodes of expressive a aphasia such as, "there was a few minutes of acting the clinicals then seemed to go normal". "As normal as it is now its normally not normal".  He also had a difficult time recalling words. Multiple phrases that patient cannot fully say. States "I can't get it right now".  He reports that he had one stroke a year ago and does not have any residual deficits.  There is no family history of stroke or coagulation disorders. He experiences stress at work, but not overly stressful. He reports standing mostly at work rather than sitting. When asked about medical issues, patient reports that he does not have any, but upon specific questioning, patient does have diabetes and high blood pressure. He reports taking his medication daily; however, it appears that he has not refilled his medication since April 2019.   In the ED, CMP was s/f Na 132 - corrected 136, Cl 97, Glucose 283, ALT 47, AG 12, WBC 15.3, ANC 12.8. UDS was negative. MRI w/ evidence of ischemic infarcts and patient admitted to FPTS.  Review Of Systems: Per HPI, with the following additions:  Review of Systems  Constitutional: Positive for diaphoresis. Negative for  chills and fever.  HENT: Negative for hearing loss.   Eyes: Negative for blurred vision.  Respiratory: Negative for cough and shortness of breath.   Cardiovascular: Negative for chest pain.  Gastrointestinal: Negative for abdominal pain, constipation, heartburn, nausea and vomiting.  Genitourinary: Negative for dysuria.  Skin: Negative for itching and rash.  Endo/Heme/Allergies: Does not bruise/bleed easily.   Patient Active Problem List   Diagnosis Date Noted  . Snoring 12/04/2017  . Dysphasia 05/12/2017  . Acute kidney injury (HCC) 05/12/2017  . H/O cerebral artery stenosis 05/12/2017  . 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 Diabetes mellitus type I (HCC)   . Hyperlipidemia   . CVA (cerebral vascular accident) (HCC) 05/02/2017  . Essential hypertension 02/01/2014  . Morbid obesity (HCC) 02/01/2014   Past Medical History: Past Medical History:  Diagnosis Date  . Diabetes mellitus type 2 with complications (HCC)   . Headache   . Hypertension   . Stroke (HCC)    Infarct of left cingulate gyrus   Past Surgical History: Past Surgical History:  Procedure Laterality Date  . TEE WITHOUT CARDIOVERSION N/A  05/04/2017   Procedure: TRANSESOPHAGEAL ECHOCARDIOGRAM (TEE);  Surgeon: Elease Hashimoto Deloris Ping, MD;  Location: [facility] ENDOSCOPY;  Service: Cardiovascular;  Laterality: N/A;   Family History: family history includes Diabetes in his maternal grandmother; Hyperlipidemia in his father; Hypertension in his father; Stroke in his father.   Social History: Social History   Social History Narrative  . Not on file     Allergies and Medications: No Known Allergies No current facility-administered medications on file prior to encounter.    Current Outpatient Medications on File Prior to Encounter  Medication Sig Dispense Refill  . amLODipine (NORVASC) 10 MG tablet Take 1 tablet (10 mg total) by mouth daily. 90 tablet 3  . cholecalciferol  (VITAMIN D) 25 MCG (1000 UT) tablet Take 1,000 Units by mouth daily.    . cloNIDine (CATAPRES) 0.2 MG tablet Take 1 tablet (0.2 mg total) by mouth daily. 90 tablet 3  . clopidogrel (PLAVIX) 75 MG tablet Take 1 tablet (75 mg total) by mouth daily. 90 tablet 3  . LANTUS SOLOSTAR 100 UNIT/ML Solostar Pen INJECT 13 UNITS SUBCUTANEOUSLY INTO THE SKIN EVERY MORNING (Patient taking differently: Inject 13 Units into the skin daily. ) 15 mL 0  . lisinopril (PRINIVIL,ZESTRIL) 20 MG tablet Take 1 tablet (20 mg total) by mouth 2 (two) times daily. (Patient taking differently: Take 20 mg by mouth daily. ) 90 tablet 0  . MENAQUINONE-7 PO Take 90 mcg by mouth daily.    Marland Kitchen PRESCRIPTION MEDICATION Take 500 mg by mouth daily. Metformin IR or XR    . atorvastatin (LIPITOR) 80 MG tablet Take 1 tablet (80 mg total) by mouth daily at 6 PM. 90 tablet 1  . metFORMIN (GLUCOPHAGE) 500 MG tablet Take 1 tablet (500 mg total) by mouth 2 (two) times daily with a meal. (Patient not taking: Reported on 10/21/2018) 60 tablet 0    Objective: BP (!) 132/94   Pulse (!) 104   Temp 99 F (37.2 C) (Oral)   Resp 20   Ht 5\' 10"  (1.778 m)   Wt 99.8 kg   SpO2 92%   BMI 31.57 kg/m   Exam: Appearance: The patient is well-appearing and appears stated age. No obvious dysmorphias. Pt is diaphoretic while sleeping upon arrival to the room. Head: There is no tenderness over the scalp or neck and no bruits over the eyes or at the neck. There is no proptosis, lid swelling, conjunctival injection, or chemosis.  Cardiac: RRR. No murmurs.  Lungs: CTAB with no wheezes, rales, or rhonchi appreciated.  Abdomen:non-distended and non-tender to light and deep palpation. Positive bowel sounds.   Neurologic examination: Level of Consciousness: Awake Mental status:  Cognition:The patient is alert, attentive, and oriented x4.  Speech is clear with good repetition, comprehension, and naming, but with paraphasia Cranial nerves: CN II: Visual fields  are full to confrontation. Pupils are 4 mm and reactive to light.  CN III, IV, VI: At primary gaze, there is no eye deviation. No ptosis. EOMI.  CN V: Facial sensation is intact in all 3 divisions bilaterally.  CN VII: Face is symmetric with normal eye closure and smile. CN VII: Hearing is normal to rubbing fingers CN IX, X: Palate elevates symmetrically. Phonation is normal. CN XI: Head turning and shoulder shrug are intact CN XII: Tongue is midline with normal movements and no atrophy. Motor: There is no pronator drift of out-stretched arms. Muscle bulk and tone are normal. Strength is 5/5 in all major muscle groups bilaterally. Deltoid, biceps, triceps,  wrist extension, finger abduction, hip flexion, hip extension, knee flexion, knee extension, ankle flexion, ankle extension. Reflexes: Reflexes are 2+ and symmetric at the knees. Plantar responses are flexor. Sensory: Grossly intact Coordination: Rapid alternating movements are intact Patient has very difficult time understanding directions to both rapid alternating movements and heel to shin exam. Pt able to perform right heel to shin, but cannot perform left heel to shin.  Gait/Stance: Gait not appreciated   Labs and Imaging: I have personally reviewed following labs and imaging studies  CBC: Recent Labs  Lab 10/21/18 2159  WBC 15.3*  NEUTROABS 12.8*  HGB 15.4  HCT 48.1  MCV 87.9  PLT 295   Basic Metabolic Panel: Recent Labs  Lab 10/21/18 2159  NA 132*  K 4.6  CL 97*  CO2 23  GLUCOSE 283*  BUN 20  CREATININE 1.12  CALCIUM 9.7   GFR: Estimated Creatinine Clearance: 109 mL/min (by C-G formula based on SCr of 1.12 mg/dL). Liver Function Tests: Recent Labs  Lab 10/21/18 2159  AST 26  ALT 47*  ALKPHOS 65  BILITOT 1.0  PROT 7.2  ALBUMIN 4.2   Coagulation Profile: Recent Labs  Lab 10/21/18 2159  INR 1.02   CBG: Recent Labs  Lab 10/21/18 2136  GLUCAP 244*   Urine analysis:    Component Value  Date/Time   COLORURINE YELLOW 05/12/2017 0031   APPEARANCEUR HAZY (A) 05/12/2017 0031   LABSPEC 1.016 05/12/2017 0031   PHURINE 5.0 05/12/2017 0031   GLUCOSEU NEGATIVE 05/12/2017 0031   HGBUR NEGATIVE 05/12/2017 0031   BILIRUBINUR NEGATIVE 05/12/2017 0031   KETONESUR NEGATIVE 05/12/2017 0031   PROTEINUR NEGATIVE 05/12/2017 0031   NITRITE NEGATIVE 05/12/2017 0031   LEUKOCYTESUR NEGATIVE 05/12/2017 0031   Imaging: Ct Head Wo Contrast - Result Date: 10/21/2018 IMPRESSION: 1. No acute intracranial abnormality. No intracranial mass, hemorrhage or edema. 2. Old infarct within the LEFT thalamus. 3. Maxillary sinus disease, of uncertain chronicity.   Mr Brain Wo Contrast - Result Date: 10/22/2018 IMPRESSION: 1. 8 mm acute ischemic nonhemorrhagic left occipital lobe infarct. 2. Additional 4 mm early subacute ischemic nonhemorrhagic small vessel type infarct involving the right centrum semi ovale. 3. Underlying mild chronic microvascular ischemic disease with scattered remote lacunar infarcts involving the bilateral basal ganglia and thalami. 4. Acute maxillary sinusitis.    EKG Interpretation  Date/Time:  Sunday October 21 2018 22:11:49 EST Ventricular Rate:  114 PR Interval:  168 QRS Duration: 90 QT Interval:  318 QTC Calculation: 438 R Axis:   -14 Text Interpretation:  Sinus tachycardia Possible Inferior infarct , age undetermined Cannot rule out Anterior infarct , age undetermined Abnormal ECG No acute changes Confirmed by Derwood Kaplananavati, Ankit ([id number]) on 10/21/2018 11:09:30 PM       Procedures:  None    Melene PlanKim, Rachel E, M.D. 10/22/2018, 3:45 AM PGY-1, [facility] FPTS Intern pager: [phone], text pages welcome   FPTS Upper-Level Resident Addendum   I have independently interviewed and examined the patient. I have discussed the above with the original author and agree with their documentation. My edits for correction/addition/clarification are in blue. Please see also any  attending notes.   Oralia ManisSherin Prakash Kimberling, DO PGY-2, [place] [facility] 10/22/2018 5:27 AM  FPTS Service pager: [phone] (text pages welcome through [facility])

## 2018-10-22 NOTE — Progress Notes (Signed)
SLP Cancellation Note  Patient Details Name: Thomas Burch MRN: [id number] DOB: Jun 08, 1986   Cancelled treatment:       Reason Eval/Treat Not Completed: Patient at procedure or test/unavailable   [name] 10/22/2018, 2:33 PM

## 2018-10-22 NOTE — ED Notes (Signed)
Patient transported to MRI 

## 2018-10-22 NOTE — Progress Notes (Addendum)
Initial Nutrition Assessment  DOCUMENTATION CODES:   Obesity unspecified  INTERVENTION:   Discouraged Keto Diet, emphasized/educated eating a steady diet throughout the day with more balanced meals and lean protein options.    Provided Heart healthy Carb Mod handout from AND Nutrition Care Manual.   Recommend in addition to CHO Mod, also be on Heart Healthy Diet.    Ensure Max Protein BID, Per serving 150 Cal. 30g protein, breakfast and dinner.    NUTRITION DIAGNOSIS:   Inadequate oral intake related to decreased appetite, acute illness(CVA) as evidenced by estimated needs, per patient/family report.    GOAL:   Patient will meet greater than or equal to 90% of their needs    MONITOR:   PO intake, Supplement acceptance  REASON FOR ASSESSMENT:   Consult Assessment of nutrition requirement/status  ASSESSMENT:   31y M with PMH of controlled DM2, HTN, prior stroke (L Splenium of the Corpus Callosum, R occipital and L Thalamus and Posterior internal Capsule 2018). No residual weakness or slurred speech. Pt now admitted for CVA.    Pt reports that his appetite has not been that great over the last couple days to week. His usual weight is typically around 230 lbs (104.5kg) and tends to fluctuate +/- 7-8 lbs a month. Pt reported that before this event, he was trying to follow a keto diet.  Pt works at [employer] and work schedule varies.  So his diet varies. Pt reports that typically he will eat fruit for breakfast (apples, oranges, or a banana). Then for lunch a plain, no bun, Hamburger. Then for dinner sometimes more fruit or nothing at all. Pt states that when he gets home from work he passes out and sleeps until 9-10pm, maybe snacks on something then goes to bed. Pt states that he thinks one or several of his medications are making him excessively tired. Encouraged Pt to speak with his Doctors about medication regime.  Pt reports taking some supplements like  Vitamin D3. Also because of his work schedule he has forgotten to bring his Lantus with him to work several times, more occasional, so he misses is dosing. Pt also reports that he is on Metformin 500mg  at home.  Pt will also have some snacks throughout the day such as more fruit.   Pt has no residual weakness, slurred speech, or difficulty eating that has lingered since any previous/current stroke events.     Labs reviewed:  Glucose 220 Lipid Profile:  HDL 32(L)   LDL 118(H)  Medications reviewed and include:  lisinopril 20mg  BP Atorvastatin 80mg  Cholesterol Aspirin regimen Lipitor 80mg  Cholesterol/TriGly. plavix 75 mg Blood thinner NovoLog SSI 0-15units and 0-5units     NUTRITION - FOCUSED PHYSICAL EXAM:    Most Recent Value  Orbital Region  No depletion  Upper Arm Region  No depletion  Thoracic and Lumbar Region  No depletion  Buccal Region  No depletion  Temple Region  No depletion  Clavicle Bone Region  No depletion  Clavicle and Acromion Bone Region  No depletion  Scapular Bone Region  No depletion  Dorsal Hand  No depletion  Patellar Region  No depletion  Anterior Thigh Region  No depletion  Posterior Calf Region  No depletion  Edema (RD Assessment)  None  Hair  Reviewed  Eyes  Reviewed  Mouth  Reviewed  Skin  Reviewed [Has had a rash on L forearm >29yr]  Nails  Reviewed       Diet Order:   Diet  Order            Diet Carb Modified Fluid consistency: Thin; Room service appropriate? Yes  Diet effective now              EDUCATION NEEDS:   Education needs have been addressed  Skin:  Skin Assessment: Reviewed RN Assessment  Last BM:  Unknown  Height:   Ht Readings from Last 1 Encounters:  10/21/18 5\' 10"  (1.778 m)    Weight:   Wt Readings from Last 1 Encounters:  10/21/18 99.8 kg    Ideal Body Weight:  70.9 kg  BMI:  Body mass index is 31.57 kg/m.  Estimated Nutritional Needs:   Kcal:  2000-2200  Protein:  100-125 grams  Fluid:   >2L    Burnard Bunting, [facility] Dietetic Intern

## 2018-10-22 NOTE — Evaluation (Signed)
Occupational Therapy Evaluation Patient Details Name: Thomas Burch MRN: [id number] DOB: 08/09/1986 Today's Date: 10/22/2018    History of Present Illness 33 yo male presenting with slurred speech and AMS. PMH including CVA (04/2017), Type two DM, and HTN. MRI showing ischemic nonhemorrhagic left occipital lobe infarct and subacute infarct involving the right centrum semi ovale.   Clinical Impression   PTA, pt was living with his grandmother and was independent and working full time. Currently, pt performing ADLs at Mod I level with increased time as needed. Pt requiring increased time for processing and reports he did not sleep well last night. Pt would benefit from further acute OT to facilitate safe dc and further assess cognition. Recommend dc to home once medically stable per physician.      Follow Up Recommendations  No OT follow up;Supervision - Intermittent    Equipment Recommendations  None recommended by OT    Recommendations for Other Services       Precautions / Restrictions Restrictions Weight Bearing Restrictions: No      Mobility Bed Mobility Overal bed mobility: Modified Independent             General bed mobility comments: Increased time  Transfers Overall transfer level: Needs assistance   Transfers: Sit to/from Stand Sit to Stand: Supervision         General transfer comment: supervision for initial safety out of bed    Balance Overall balance assessment: No apparent balance deficits (not formally assessed)                                         ADL either performed or assessed with clinical judgement   ADL Overall ADL's : Modified independent                                       General ADL Comments: Pt performing ADLs at Mod I level with increased time as needed due to tireness. SpO2 >96% on RA throughout     Vision Baseline Vision/History: Wears glasses Wears Glasses: At all times Patient  Visual Report: No change from baseline Additional Comments: Visually tracking throughout. Denies blurry vision or diplopia. Pt with one moment where he could not find cellphone in right visual field (on the table in front of him), but no other times where pt had difficult with locating items and scanning room     Perception     Praxis      Pertinent Vitals/Pain Pain Assessment: No/denies pain     Hand Dominance Right   Extremity/Trunk Assessment Upper Extremity Assessment Upper Extremity Assessment: Overall WFL for tasks assessed   Lower Extremity Assessment Lower Extremity Assessment: Defer to PT evaluation   Cervical / Trunk Assessment Cervical / Trunk Assessment: Normal   Communication Communication Communication: No difficulties   Cognition Arousal/Alertness: Awake/alert(Tired) Behavior During Therapy: WFL for tasks assessed/performed Overall Cognitive Status: Within Functional Limits for tasks assessed                                 General Comments: WFL for tasks completed and able to maintain conversation appropriately. Pt presenting with fatigue and was tired reporting he hadn't slept much. Pt with one moment where he was looking for his  cell phone, which was on the table in front of him. No other times with poor visual scanning and feel this may be due to fatigue and lack of sleep   General Comments  SpO2 >96% on RA    Exercises     Shoulder Instructions      Home Living Family/patient expects to be discharged to:: Private residence Living Arrangements: Other relatives(Grandmother) Available Help at Discharge: Family Type of Home: House Home Access: Stairs to enter Secretary/administrator of Steps: 1 Entrance Stairs-Rails: None Home Layout: One level     Bathroom Shower/Tub: Chief Strategy Officer: Standard     Home Equipment: None          Prior Functioning/Environment Level of Independence: Independent         Comments: was working at [employer]         OT Problem List: Decreased activity tolerance;Decreased knowledge of precautions;Decreased safety awareness      OT Treatment/Interventions: Self-care/ADL training;Therapeutic exercise;Cognitive remediation/compensation;Patient/family education    OT Goals(Current goals can be found in the care plan section) Acute Rehab OT Goals Patient Stated Goal: "Go home" OT Goal Formulation: With patient Time For Goal Achievement: 11/05/18 Potential to Achieve Goals: Good ADL Goals Additional ADL Goal #1: Pt will perform ADLs at Independent level Additional ADL Goal #2: Pt will perform four part trail making task with 2-3 cues Additional ADL Goal #3: Pt will perform simple meal preparation task independently  OT Frequency: Min 2X/week   Barriers to D/C: Other (comment)  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       Co-evaluation              AM-PAC OT "6 Clicks" Daily Activity     Outcome Measure Help from another person eating meals?: None Help from another person taking care of personal grooming?: None Help from another person toileting, which includes using toliet, bedpan, or urinal?: None Help from another person bathing (including washing, rinsing, drying)?: None Help from another person to put on and taking off regular upper body clothing?: None Help from another person to put on and taking off regular lower body clothing?: None 6 Click Score: 24   End of Session Nurse Communication: Mobility status  Activity Tolerance: Patient tolerated treatment well Patient left: in chair;with call bell/phone within reach;with chair alarm set  OT Visit Diagnosis: Other symptoms and signs involving cognitive function;Muscle weakness (generalized) (M62.81);Unsteadiness on feet (R26.81)                Time: 6979-4801 OT Time Calculation (min): 15 min Charges:  OT General Charges $OT Visit: 1 Visit OT Evaluation $OT Eval Moderate  Complexity: 1 Mod  Love Milbourne MSOT, OTR/L Acute Rehab Pager: [phone] Office: [phone]  Thomas Burch 10/22/2018, 8:08 AM

## 2018-10-22 NOTE — CV Procedure (Signed)
Attempted 2D Echo, Patient is going to CT per RN, will try again at a later time.     10/22/18 Sinda Du

## 2018-10-22 NOTE — Progress Notes (Signed)
  Echocardiogram 2D Echocardiogram has been performed.  Thomas Burch 10/22/2018, 12:59 PM

## 2018-10-22 NOTE — Evaluation (Signed)
Physical Therapy Evaluation Patient Details Name: Thomas Burch MRN: [id number] DOB: [birth year] Today's Date: 10/22/2018   History of Present Illness  33 yo male presenting with slurred speech and AMS. PMH including CVA (04/2017), Type two DM, and HTN. MRI showing ischemic nonhemorrhagic left occipital lobe infarct and subacute infarct involving the right centrum semi ovale.  Clinical Impression  Patient received in bed, agrees to PT evaluation. Patient demonstrates 5/5 strength in all extremities. Demonstrates independence with bed mobility and transfers. He ambulated 200 feet without AD or difficulty with supervision.  Patient does seem a little delayed with processing. Patient does not require follow up PT at this time as he is functioning ad independent level.     Follow Up Recommendations No PT follow up    Equipment Recommendations       Recommendations for Other Services       Precautions / Restrictions Precautions Precautions: None Restrictions Weight Bearing Restrictions: No      Mobility  Bed Mobility Overal bed mobility: Modified Independent             General bed mobility comments: Increased time  Transfers Overall transfer level: Modified independent   Transfers: Sit to/from Stand Sit to Stand: Modified independent (Device/Increase time)         General transfer comment: supervision for initial safety out of bed  Ambulation/Gait Ambulation/Gait assistance: Supervision Gait Distance (Feet): 200 Feet Assistive device: None Gait Pattern/deviations: WFL(Within Functional Limits) Gait velocity: WNL   General Gait Details: no unsteadiness noted  Stairs            Wheelchair Mobility    Modified Rankin (Stroke Patients Only)       Balance Overall balance assessment: No apparent balance deficits (not formally assessed)                                           Pertinent Vitals/Pain Pain Assessment: No/denies pain     Home Living Family/patient expects to be discharged to:: Private residence Living Arrangements: Other relatives Available Help at Discharge: Family Type of Home: House Home Access: Stairs to enter Entrance Stairs-Rails: None Secretary/administrator of Steps: 1 Home Layout: One level Home Equipment: None      Prior Function Level of Independence: Independent         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, art [employer]   Dominant Hand: Right    Extremity/Trunk Assessment   Upper Extremity Assessment Upper Extremity Assessment: Overall WFL for tasks assessed    Lower Extremity Assessment Lower Extremity Assessment: Overall WFL for tasks assessed    Cervical / Trunk Assessment Cervical / Trunk Assessment: Normal  Communication   Communication: No difficulties  Cognition Arousal/Alertness: Awake/alert Behavior During Therapy: WFL for tasks assessed/performed Overall Cognitive Status: Within Functional Limits for tasks assessed                                 General Comments: patient seems to have slow processing. Difficulty multitasking,        General Comments General comments (skin integrity, edema, etc.): SpO2 >96% on RA    Exercises     Assessment/Plan    PT Assessment Patent does not need any further PT services  PT Problem List  PT Treatment Interventions      PT Goals (Current goals can be found in the Care Plan section)  Acute Rehab PT Goals Patient Stated Goal: "Go home" PT Goal Formulation: With patient Time For Goal Achievement: 10/29/18 Potential to Achieve Goals: Good    Frequency     Barriers to discharge        Co-evaluation               AM-PAC PT "6 Clicks" Mobility  Outcome Measure Help needed turning from your back to your side while in a flat bed without using bedrails?: None Help needed moving from lying on your back to sitting on the side of a flat bed without using bedrails?:  None Help needed moving to and from a bed to a chair (including a wheelchair)?: None Help needed standing up from a chair using your arms (e.g., wheelchair or bedside chair)?: None Help needed to walk in hospital room?: None Help needed climbing 3-5 steps with a railing? : None 6 Click Score: 24    End of Session Equipment Utilized During Treatment: Gait belt Activity Tolerance: Patient tolerated treatment well Patient left: in bed;with bed alarm set;with call bell/phone within reach Nurse Communication: Mobility status      Time: 1884-1660 PT Time Calculation (min) (ACUTE ONLY): 12 min   Charges:   PT Evaluation $PT Eval Low Complexity: 1 Low          Nimra Puccinelli, PT, GCS 10/22/18,10:09 AM

## 2018-10-22 NOTE — Progress Notes (Addendum)
Inpatient Diabetes Program Recommendations  AACE/ADA: New Consensus Statement on Inpatient Glycemic Control (2015)  Target Ranges:  Prepandial:   less than 140 mg/dL      Peak postprandial:   less than 180 mg/dL (1-2 hours)      Critically ill patients:  140 - 180 mg/dL   Lab Results  Component Value Date   GLUCAP 256 (H) 10/22/2018   HGBA1C 11.9 (H) 04/29/2017    Review of Glycemic Control Results for Thomas Burch, Thomas Burch (MRN [id number]) as of 10/22/2018 14:57  Ref. Range 10/21/2018 21:36 10/22/2018 06:57 10/22/2018 11:15  Glucose-Capillary Latest Ref Range: 70 - 99 mg/dL 409 (H) 811 (H) 914 (H)   Diabetes history: Type 2 DM Outpatient Diabetes medications: Lantus 13 units QD, Metformin 500 mg QD Current orders for Inpatient glycemic control: Lantus 10 units QHS, Novolog 0-15 units TID, Novolog 0-5 units QHS  Inpatient Diabetes Program Recommendations:    Spoke with patient regarding diabetes. Verified home medications, denies lows or missing doses.   Reviewed patient's last A1c of 11.9% back in 2018, and that current one is pending. Explained what a A1c is and what it measures. Also reviewed goal A1c with patient, importance of good glucose control @ home, and blood sugar goals. Reviewed in detail patho of DM, need for insulin, role of pancreas, vascular changes and comorbidites of poor glycemic control.   Patient checks blood glucose once per day (ranges from 200-410 mg/dL). Encouraged to begin checking CBGs 3-4 times per day. Discussed Freestyle Libre to include cost, benefits, and application process. Patient needs to follow up with PCP, as he did not know who was prescribing his insulin. His goal for today was to make f/u appointment. During conversation, patient states, "My insulin costs usually around 125 dollars per month, even with a copay card."  Case management consult placed. Will attempt to do benefits check for other options.   Also, placed outpatient referral for education and  dietitian consult. Patient has no further questions at this time.  Will continue to follow.   Thanks, Lujean Rave, MSN, RNC-OB Diabetes Coordinator [phone] (8a-5p)

## 2018-10-22 NOTE — Care Management (Signed)
#    8.  S/W DESIREE  @ [employer]  RX # [phone]  INSULINS:  1. LANTUS  10 UNITS DAILY ( VAL ) COVER-YES CO-PAY- $ 35.00 TIER- NO PRIOR APPROVAL- NO  2. BASAGLAR 10 UNITS  DAILY ( WIKPEN ) COVER-YES CO-PAY- $ 160.93 TIER- NONE PREFERRED PRIOR APPROVAL- NO  3.LEVEMIRE 10 UNITS DAILY ( VAL ) COVER- YES CO-PAY- $ 35.00 TIER-NO PRIOR APPROVAL- NO  4. TOUJEO 10 UNITS DAILY  (SOLOSTAR  PEN ) COVER- YES CO-PAY- $ 35.00 TIER- NO PRIOR APPROVAL- NO  5. TRESIBA 10 UNITS DAILY ( VAL ) COVER- YES CO-PAY- $ 35.00 TIER- NO PRIOR APPROVAL- NO  PREFERRED PHARMACY : YES [facility], [facility] AND [facility]

## 2018-10-22 NOTE — Progress Notes (Signed)
CM placed benefits check for: Lantus, Basaglar, Levemir, Lafonda Mosses per DM consult. CM following.  Attempted to meet with patient x 3 today. Pt on phone twice and did not get off phone to speak with CM.

## 2018-10-22 NOTE — ED Provider Notes (Signed)
[facility] EMERGENCY DEPARTMENT Provider Note   CSN: [id number] Arrival date & time: 10/21/18  2107     History   Chief Complaint Chief Complaint  Patient presents with  . Altered Mental Status    HPI Thomas Burch is a 33 y.o. male.  HPI 33 year old male with history of diabetes, hypertension, stroke comes in with chief complaint of altered sensorium and difficulty in expressing himself. Patient reports that he woke up this morning not feeling completely well.  He carried forward with the day, but noted that his blood pressure was rising throughout the day.  While he was at work this evening he suddenly had an episode that lasted for about 30 minutes where he could not get the right words out and could not say the name of his colleagues.  Patient had a moment of right hand numbness which resolved on its own.  Currently patient has no complaints.  He denied any focal weakness, vision changes, dizziness.  Patient's prior stroke had led to some left-sided numbness.  Patient admits to taking all of his medications as prescribed except for diabetes meds.   Past Medical History:  Diagnosis Date  . Diabetes mellitus type 2 with complications (HCC)   . Headache   . Hypertension   . Stroke (HCC)    Infarct of left cingulate gyrus    Patient Active Problem List   Diagnosis Date Noted  . Snoring 12/04/2017  . Dysphasia 05/12/2017  . Acute kidney injury (HCC) 05/12/2017  . H/O cerebral artery stenosis 05/12/2017  . 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 Diabetes mellitus type I (HCC)   . Hyperlipidemia   . CVA (cerebral vascular accident) (HCC) 05/02/2017  . Essential hypertension 02/01/2014  . Morbid obesity (HCC) 02/01/2014    Past Surgical History:  Procedure Laterality Date  . TEE WITHOUT CARDIOVERSION N/A 05/04/2017   Procedure: TRANSESOPHAGEAL ECHOCARDIOGRAM (TEE);  Surgeon: Elease Hashimoto Deloris Ping, MD;   Location: [facility] ENDOSCOPY;  Service: Cardiovascular;  Laterality: N/A;        Home Medications    Prior to Admission medications   Medication Sig Start Date End Date Taking? Authorizing Provider  amLODipine (NORVASC) 10 MG tablet Take 1 tablet (10 mg total) by mouth daily. 12/04/17  Yes Marvel Plan, MD  cholecalciferol (VITAMIN D) 25 MCG (1000 UT) tablet Take 1,000 Units by mouth daily.   Yes [provider]  cloNIDine (CATAPRES) 0.2 MG tablet Take 1 tablet (0.2 mg total) by mouth daily. 12/04/17  Yes Marvel Plan, MD  clopidogrel (PLAVIX) 75 MG tablet Take 1 tablet (75 mg total) by mouth daily. 12/04/17  Yes Marvel Plan, MD  LANTUS SOLOSTAR 100 UNIT/ML Solostar Pen INJECT 13 UNITS SUBCUTANEOUSLY INTO THE SKIN EVERY MORNING Patient taking differently: Inject 13 Units into the skin daily.  08/09/18  Yes Talbert Forest, [place], DO  lisinopril (PRINIVIL,ZESTRIL) 20 MG tablet Take 1 tablet (20 mg total) by mouth 2 (two) times daily. Patient taking differently: Take 20 mg by mouth daily.  12/07/17  Yes Marvel Plan, MD  MENAQUINONE-7 PO Take 90 mcg by mouth daily.   Yes [provider]  PRESCRIPTION MEDICATION Take 500 mg by mouth daily. Metformin IR or XR   Yes [provider]  atorvastatin (LIPITOR) 80 MG tablet Take 1 tablet (80 mg total) by mouth daily at 6 PM. 08/04/17   Talbert Forest, [place], DO  metFORMIN (GLUCOPHAGE) 500 MG tablet Take 1 tablet (500 mg total) by  mouth 2 (two) times daily with a meal. Patient not taking: Reported on 10/21/2018 08/04/17   Shirley, [place], DO    Family History Family History  Problem Relation Age of Onset  . Hypertension Father   . Stroke Father   . Hyperlipidemia Father   . Diabetes Maternal Grandmother     Social History Social History   Tobacco Use  . Smoking status: Never Smoker  . Smokeless tobacco: Never Used  Substance Use Topics  . Alcohol use: No    Comment: 1 drink per week  . Drug use: No     Allergies   Patient has no known  allergies.   Review of Systems Review of Systems  Constitutional: Positive for activity change.  Respiratory: Negative for shortness of breath.   Cardiovascular: Negative for chest pain.  Neurological: Positive for speech difficulty.  All other systems reviewed and are negative.    Physical Exam Updated Vital Signs BP 127/85   Pulse 95   Temp 99 F (37.2 C) (Oral)   Resp 16   Ht 5\' 10"  (1.778 m)   Wt 99.8 kg   SpO2 95%   BMI 31.57 kg/m   Physical Exam Vitals signs and nursing note reviewed.  Constitutional:      Appearance: He is well-developed.  HENT:     Head: Atraumatic.  Eyes:     Extraocular Movements: Extraocular movements intact.     Pupils: Pupils are equal, round, and reactive to light.  Neck:     Musculoskeletal: Neck supple.  Cardiovascular:     Rate and Rhythm: Normal rate.  Pulmonary:     Effort: Pulmonary effort is normal.  Skin:    General: Skin is warm.  Neurological:     Mental Status: He is alert and oriented to person, place, and time.     Cranial Nerves: No cranial nerve deficit.     Sensory: No sensory deficit.     Motor: No weakness.     Coordination: Coordination normal.      ED Treatments / Results  Labs (all labs ordered are listed, but only abnormal results are displayed) Labs Reviewed  CBC - Abnormal; Notable for the following components:      Result Value   WBC 15.3 (*)    All other components within normal limits  DIFFERENTIAL - Abnormal; Notable for the following components:   Neutro Abs 12.8 (*)    All other components within normal limits  COMPREHENSIVE METABOLIC PANEL - Abnormal; Notable for the following components:   Sodium 132 (*)    Chloride 97 (*)    Glucose, Bld 283 (*)    ALT 47 (*)    All other components within normal limits  CBG MONITORING, ED - Abnormal; Notable for the following components:   Glucose-Capillary 244 (*)    All other components within normal limits  PROTIME-INR  APTT  CBG MONITORING,  ED    EKG EKG Interpretation  Date/Time:  Sunday October 21 2018 22:11:49 EST Ventricular Rate:  114 PR Interval:  168 QRS Duration: 90 QT Interval:  318 QTC Calculation: 438 R Axis:   -14 Text Interpretation:  Sinus tachycardia Possible Inferior infarct , age undetermined Cannot rule out Anterior infarct , age undetermined Abnormal ECG No acute changes Confirmed by Derwood Kaplananavati, Johndavid Geralds [phone]([id number]) on 10/21/2018 11:09:30 PM   Radiology Ct Head Wo Contrast  Result Date: 10/21/2018 CLINICAL DATA:  Severe headache, brief memory loss, elevated blood pressure. EXAM: CT HEAD WITHOUT CONTRAST TECHNIQUE: Contiguous  axial images were obtained from the base of the skull through the vertex without intravenous contrast. COMPARISON:  Head CT dated 05/11/2017 and brain MRI dated 05/12/2017. FINDINGS: Brain: Old infarct within the LEFT thalamus. No mass, hemorrhage, edema or other evidence of acute parenchymal abnormality. No extra-axial hemorrhage. Vascular: No hyperdense vessel or unexpected calcification. Skull: Normal. Negative for fracture or focal lesion. Sinuses/Orbits: Fluid levels and mucosal thickening within the bilateral maxillary sinuses. Periorbital and retro-orbital soft tissues are unremarkable. Other: None. IMPRESSION: 1. No acute intracranial abnormality. No intracranial mass, hemorrhage or edema. 2. Old infarct within the LEFT thalamus. 3. Maxillary sinus disease, of uncertain chronicity. Electronically Signed   By: Bary RichardStan  Maynard M.D.   On: 10/21/2018 22:36   Mr Brain Wo Contrast  Result Date: 10/22/2018 CLINICAL DATA:  Initial evaluation for acute headache, generalized weakness, dizziness. EXAM: MRI HEAD WITHOUT CONTRAST TECHNIQUE: Multiplanar, multiecho pulse sequences of the brain and surrounding structures were obtained without intravenous contrast. COMPARISON:  Prior CT from 10/21/2018 FINDINGS: Brain: Generalized age-appropriate cerebral volume. Mild chronic micro vessel ischemic changes  present within the periventricular and deep white matter both cerebral hemispheres. Few small remote lacunar infarcts present within the bilateral basal ganglia and thalami. 8 mm acute ischemic infarct present at the left occipital lobe, left PCA territory (series 5, image 64). No associated hemorrhage or mass effect. Probable additional early subacute 4 mm small vessel type infarcts seen involving the posterior right centrum semi ovale (series 5, image 84). No other evidence for acute or recent infarction. No encephalomalacia to suggest chronic cortical infarction. No foci of susceptibility artifact to suggest acute or chronic intracranial hemorrhage. No mass lesion, midline shift or mass effect. No hydrocephalus. No extra-axial fluid collection. Pituitary gland normal. Vascular: Major intracranial vascular flow voids maintained. Skull and upper cervical spine: Craniocervical junction normal. Upper cervical spine normal. Bone marrow signal intensity within normal limits. No scalp soft tissue abnormality. Sinuses/Orbits: Globes and orbital soft tissues within normal limits. Mucosal thickening with air-fluid levels present within the maxillary sinuses bilaterally, compatible with acute sinusitis. Small left mastoid effusion noted, of doubtful significance. Inner ear structures grossly normal. Other: None. IMPRESSION: 1. 8 mm acute ischemic nonhemorrhagic left occipital lobe infarct. 2. Additional 4 mm early subacute ischemic nonhemorrhagic small vessel type infarct involving the right centrum semi ovale. 3. Underlying mild chronic microvascular ischemic disease with scattered remote lacunar infarcts involving the bilateral basal ganglia and thalami. 4. Acute maxillary sinusitis. Electronically Signed   By: Rise MuBenjamin  McClintock M.D.   On: 10/22/2018 02:06    Procedures Procedures (including critical care time)  Medications Ordered in ED Medications  sodium chloride flush (NS) 0.9 % injection 3 mL (has no  administration in time range)  LORazepam (ATIVAN) 2 MG/ML injection (has no administration in time range)  LORazepam (ATIVAN) injection 1 mg (1 mg Intravenous Given 10/22/18 0104)     Initial Impression / Assessment and Plan / ED Course  I have reviewed the triage vital signs and the nursing notes.  Pertinent labs & imaging results that were available during my care of the patient were reviewed by me and considered in my medical decision making (see chart for details).     Patient comes in with chief complaint of speech difficulty.  He has history of hypertension poorly controlled diabetes, stroke.  Patient states that he has about 30-minute episode this evening at work where he had what appears to be a aphasia.  He also had right upper extremity numbness that  lasted just a few seconds.  Patient is back to baseline at this time and not a TPA candidate.  Neuro exam is completely nonfocal.  Neurology consulted and they are recommending that we get an MRI, which if positive then admit patient for further work-up.  MRI results have come back and patient does have new acute stroke.  We will call family medicine to admit.  Stroke team has been made aware of the MRI findings and they will continue to follow. Results of the ER work-up also discussed with the patient.  Final Clinical Impressions(s) / ED Diagnoses   Final diagnoses:  Aphasia  TIA (transient ischemic attack)  Ischemic stroke [facility])    ED Discharge Orders    None       Derwood Kaplan, MD 10/22/18 0236

## 2018-10-23 DIAGNOSIS — E1165 Type 2 diabetes mellitus with hyperglycemia: Secondary | ICD-10-CM

## 2018-10-23 DIAGNOSIS — I10 Essential (primary) hypertension: Secondary | ICD-10-CM

## 2018-10-23 LAB — BASIC METABOLIC PANEL
Anion gap: 9 (ref 5–15)
BUN: 16 mg/dL (ref 6–20)
CO2: 29 mmol/L (ref 22–32)
Calcium: 9.5 mg/dL (ref 8.9–10.3)
Chloride: 98 mmol/L (ref 98–111)
Creatinine, Ser: 1.09 mg/dL (ref 0.61–1.24)
GFR calc Af Amer: >60 mL/min (ref >60)
GFR calc non Af Amer: >60 mL/min (ref >60)
Glucose, Bld: 192 mg/dL — ABNORMAL HIGH (ref 70–99)
Potassium: 3.9 mmol/L (ref 3.5–5.1)
Sodium: 136 mmol/L (ref 135–145)

## 2018-10-23 LAB — CBC WITH DIFFERENTIAL/PLATELET
Abs Immature Granulocytes: 0.02 10*3/uL (ref 0.00–0.07)
BASOS PCT: 1 %
Basophils Absolute: 0.1 10*3/uL (ref 0.0–0.1)
Eosinophils Absolute: 0.1 10*3/uL (ref 0.0–0.5)
Eosinophils Relative: 1 %
HCT: 47.7 % (ref 39.0–52.0)
Hemoglobin: 15.9 g/dL (ref 13.0–17.0)
Immature Granulocytes: 0 %
LYMPHS ABS: 3.6 10*3/uL (ref 0.7–4.0)
Lymphocytes Relative: 34 %
MCH: 29.2 pg (ref 26.0–34.0)
MCHC: 33.3 g/dL (ref 30.0–36.0)
MCV: 87.7 fL (ref 80.0–100.0)
MONOS PCT: 7 %
Monocytes Absolute: 0.7 10*3/uL (ref 0.1–1.0)
Neutro Abs: 6 10*3/uL (ref 1.7–7.7)
Neutrophils Relative %: 57 %
Platelets: 279 10*3/uL (ref 150–400)
RBC: 5.44 MIL/uL (ref 4.22–5.81)
RDW: 12.1 % (ref 11.5–15.5)
WBC: 10.5 10*3/uL (ref 4.0–10.5)
nRBC: 0 % (ref 0.0–0.2)

## 2018-10-23 LAB — GLUCOSE, CAPILLARY
Glucose-Capillary: 221 mg/dL — ABNORMAL HIGH (ref 70–99)
Glucose-Capillary: 267 mg/dL — ABNORMAL HIGH (ref 70–99)
Glucose-Capillary: 172 mg/dL — ABNORMAL HIGH (ref 70–99)
Glucose-Capillary: 186 mg/dL — ABNORMAL HIGH (ref 70–99)

## 2018-10-23 LAB — HEMOGLOBIN A1C
Hgb A1c MFr Bld: 11.4 % — ABNORMAL HIGH (ref 4.8–5.6)
Mean Plasma Glucose: 280 mg/dL

## 2018-10-23 MED ORDER — AMLODIPINE BESYLATE 10 MG PO TABS
10.0000 mg | ORAL_TABLET | Freq: Every day | ORAL | Status: DC
  Administered 2018-10-23 – 2018-10-24 (×2): 10 mg via ORAL
  Filled 2018-10-23 (×2): qty 1

## 2018-10-23 NOTE — Progress Notes (Addendum)
Inpatient Diabetes Program Recommendations  AACE/ADA: New Consensus Statement on Inpatient Glycemic Control (2015)  Target Ranges:  Prepandial:   less than 140 mg/dL      Peak postprandial:   less than 180 mg/dL (1-2 hours)      Critically ill patients:  140 - 180 mg/dL   Lab Results  Component Value Date   GLUCAP 221 (H) 10/23/2018   HGBA1C 11.4 (H) 10/22/2018    Review of Glycemic Control Results for ABHI, QUINT (MRN [id number]) as of 10/23/2018 10:40  Ref. Range 10/22/2018 11:15 10/22/2018 16:39 10/22/2018 21:29 10/23/2018 06:22  Glucose-Capillary Latest Ref Range: 70 - 99 mg/dL 034 (H) 742 (H) 595 (H) 221 (H)   Diabetes history: Type 2 DM Outpatient Diabetes medications: Lantus 13 units QD, Metformin 500 mg QD Current orders for Inpatient glycemic control: Lantus 10 units QHS, Novolog 0-15 units TID, Novolog 0-5 units QHS  Inpatient Diabetes Program Recommendations:    Consider increasing Lantus to 16 units QHS (99.8 kg x 0.16).   Given benefits check yesterday, unsure why patient is spending >$125 dollars on Lantus, will check back in with patient today.   Addendum@1400 :  Discussed plan of care with Dr Dareen Piano. Informed about patient insurance regarding benefits check and would recommend switching to Toujeo, as patient can still get insulin pen. However, cost is $35 per month. Additionally, orders provided for application of [employer]. MD to provide additional orders for sensors at discharge.  @1420 :Spoke with patient again regarding outpatient medications. Patient was getting Lantus (pens) which were not part of benefits check. Patient is open to switching for affordability.  Freestyle Libre applied to patient's right arm. Patient educated on device, cost, application and benefits to using tool. At discharge, MD please add Freestyle Libre 14 day sensor to list [phone]). Patient is to follow up with PCP.   Thanks, Lujean Rave, MSN, RNC-OB Diabetes  Coordinator [phone] (8a-5p)

## 2018-10-23 NOTE — Progress Notes (Signed)
Family Medicine Teaching Service Daily Progress Note Intern Pager: [phone]  Patient name: Thomas Burch Medical record number: [id number] Date of birth: 24-Apr-1986 Age: 33 y.o. Gender: male  Primary Care Provider: Shirley, [place], DO Consultants: Neuro Code Status: Full  Assessment and Plan: hillip D Mussett is a 33 y.o. male admitted for CVA.  His chronic conditions include poorly controlled DM2, HLD, Obesity.   Dysarthria secondary to CVA: Acute, resolved  Continues to be back at baseline, neurologically intact.  A1c 11.4.  CTA of head and neck and echocardiogram within normal limits.  Neurology to set patient up with sleep study. Per neurology, has had extensive work-up in the past including PFO, event monitoring, hypoercoag, all of which have been negative.  We will continue to mitigate his risk factors as discussed below. -Neurology on board, appreciate continued recommendations -Continue aspirin 325 and Plavix 75 mg -Continue atorvastatin 80 mg -Continue to allow for permissive hypertension - Sleep study outpatient  Abnormal EKG  Patient without chest pain or anginal symptoms.  Troponins negative. -Repeat EKG  DM II  A1c 11.4.  Uncontrolled.  CBG average around 200.  Required 16 units of short acting insulin over the last 24 hours.  History of noncompliance, reports taking metformin 500 mg, Lantus 11 units. - Moderate SSI - discharge on Lantus 15 units -Monitor CBGs - Would like to start a GLP-1 agonist at d/c, specifically Ozempic which will be once weekly to help with his history of noncompliance, weight loss, and cardio/stroke protection, will have pharmacy discuss this medication with patient and demonstrate   HTN SBP 140- 160s.  Home medications: lisinopril 20mg , clonidine, amlodipine 10mg .  - Allow for permissive hypertension -Likely discharge back onto lisinopril and amlodipine  HLD LDL 118, question noncompliance of home atorvastatin. -Continue atorvastatin 80mg     FEN/GI:  . Fluids: none  . Electrolytes: wnl  . Nutrition: Heart healthy/carb modified  Access: R hand PIV VTE prophylaxis: Lovenox 40mg    Disposition: Potential discharge this afternoon  Subjective:  No acute events overnight.  Feels back to his baseline.  Denies any chest pain, shortness of breath, coughing, weakness/numbness, or tingling.  Has been able to speak normally.  Objective: Temp:  [97.8 F (36.6 C)-98.5 F (36.9 C)] 97.8 F (36.6 C) (02/18 0427) Pulse Rate:  [88-118] 89 (02/18 0427) Resp:  [16-17] 17 (02/18 0427) BP: (128-168)/(98-119) 156/106 (02/18 0427) SpO2:  [95 %-98 %] 98 % (02/18 0427) Physical Exam: General: Alert, NAD HEENT: NCAT, MMM, oropharynx nonerythematous  Cardiac: RRR no m/g/r Lungs: Clear bilaterally, no increased WOB  Abdomen: soft large abdomen, non-tender, non-distended, normoactive BS Msk: Moves all extremities spontaneously  Ext: Warm, dry, 2+ distal pulses, no edema  Neuro: Alert and oriented, no focal neuro deficits noted.  Speech appropriate.   Laboratory: Recent Labs  Lab 10/21/18 2159 10/22/18 0645 10/23/18 0443  WBC 15.3* 11.9* 10.5  HGB 15.4 15.2 15.9  HCT 48.1 45.7 47.7  PLT 295 318 279   Recent Labs  Lab 10/21/18 2159 10/22/18 0645 10/23/18 0443  NA 132* 135 136  K 4.6 4.0 3.9  CL 97* 99 98  CO2 23 26 29   BUN 20 14 16   CREATININE 1.12 0.98 1.09  CALCIUM 9.7 9.1 9.5  PROT 7.2  --   --   BILITOT 1.0  --   --   ALKPHOS 65  --   --   ALT 47*  --   --   AST 26  --   --  GLUCOSE 283* 220* 192*     Imaging/Diagnostic Tests: Ct Angio Head W Or Wo Contrast  Result Date: 10/22/2018 CLINICAL DATA:  Acute LEFT occipital and subacute RIGHT posterior frontal infarcts, continued surveillance. EXAM: CT ANGIOGRAPHY HEAD AND NECK TECHNIQUE: Multidetector CT imaging of the head and neck was performed using the standard protocol during bolus administration of intravenous contrast. Multiplanar CT image reconstructions  and MIPs were obtained to evaluate the vascular anatomy. Carotid stenosis measurements (when applicable) are obtained utilizing NASCET criteria, using the distal internal carotid diameter as the denominator. CONTRAST:  75mL ISOVUE-370 IOPAMIDOL (ISOVUE-370) INJECTION 76% COMPARISON:  MR brain earlier today. CT head yesterday. FINDINGS: Initial CTA was nondiagnostic. The patient had to return, with a larger caliber IV, for repeat imaging. CTA NECK FINDINGS Aortic arch: Standard branching. Imaged portion shows no evidence of aneurysm or dissection. No significant stenosis of the major arch vessel origins. Right carotid system: No evidence of dissection, stenosis (50% or greater) or occlusion. Minor atheromatous change proximal RIGHT ICA. Left carotid system: No evidence of dissection, stenosis (50% or greater) or occlusion. Minor atheromatous change proximal LEFT ICA. Vertebral arteries: Codominant. No evidence of dissection, stenosis (50% or greater) or occlusion. Skeleton: Unremarkable. Other neck: Noncontributory. Upper chest: No mass or pneumothorax. Mediastinal lipomatosis. Review of the MIP images confirms the above findings CTA HEAD FINDINGS Anterior circulation: No significant proximal stenosis, proximal occlusion, aneurysm, or vascular malformation. Mild irregularity of the RIGHT greater than LEFT M2 and M3 branches bilaterally, as well as the distal anterior cerebral artery branches, suggesting early intracranial atherosclerotic disease. Posterior circulation: No significant stenosis, proximal occlusion, aneurysm, or vascular malformation. Venous sinuses: As permitted by contrast timing, patent. Anatomic variants: None of significance. Delayed phase: No abnormal postcontrast enhancement. Review of the MIP images confirms the above findings IMPRESSION: No extracranial stenosis of significance. No intracranial large vessel occlusion or proximal flow-limiting stenosis. Mild irregularity of the RIGHT greater  than LEFT M2 and M3 MCA branches bilaterally, as well as distal ACA irregularity, suggesting intracranial atherosclerotic disease. Electronically Signed   By: Elsie StainJohn T Curnes M.D.   On: 10/22/2018 14:33   Ct Angio Neck W Or Wo Contrast  Result Date: 10/22/2018 CLINICAL DATA:  Acute LEFT occipital and subacute RIGHT posterior frontal infarcts, continued surveillance. EXAM: CT ANGIOGRAPHY HEAD AND NECK TECHNIQUE: Multidetector CT imaging of the head and neck was performed using the standard protocol during bolus administration of intravenous contrast. Multiplanar CT image reconstructions and MIPs were obtained to evaluate the vascular anatomy. Carotid stenosis measurements (when applicable) are obtained utilizing NASCET criteria, using the distal internal carotid diameter as the denominator. CONTRAST:  75mL ISOVUE-370 IOPAMIDOL (ISOVUE-370) INJECTION 76% COMPARISON:  MR brain earlier today. CT head yesterday. FINDINGS: Initial CTA was nondiagnostic. The patient had to return, with a larger caliber IV, for repeat imaging. CTA NECK FINDINGS Aortic arch: Standard branching. Imaged portion shows no evidence of aneurysm or dissection. No significant stenosis of the major arch vessel origins. Right carotid system: No evidence of dissection, stenosis (50% or greater) or occlusion. Minor atheromatous change proximal RIGHT ICA. Left carotid system: No evidence of dissection, stenosis (50% or greater) or occlusion. Minor atheromatous change proximal LEFT ICA. Vertebral arteries: Codominant. No evidence of dissection, stenosis (50% or greater) or occlusion. Skeleton: Unremarkable. Other neck: Noncontributory. Upper chest: No mass or pneumothorax. Mediastinal lipomatosis. Review of the MIP images confirms the above findings CTA HEAD FINDINGS Anterior circulation: No significant proximal stenosis, proximal occlusion, aneurysm, or vascular malformation. Mild irregularity of  the RIGHT greater than LEFT M2 and M3 branches  bilaterally, as well as the distal anterior cerebral artery branches, suggesting early intracranial atherosclerotic disease. Posterior circulation: No significant stenosis, proximal occlusion, aneurysm, or vascular malformation. Venous sinuses: As permitted by contrast timing, patent. Anatomic variants: None of significance. Delayed phase: No abnormal postcontrast enhancement. Review of the MIP images confirms the above findings IMPRESSION: No extracranial stenosis of significance. No intracranial large vessel occlusion or proximal flow-limiting stenosis. Mild irregularity of the RIGHT greater than LEFT M2 and M3 MCA branches bilaterally, as well as distal ACA irregularity, suggesting intracranial atherosclerotic disease. Electronically Signed   By: Elsie Stain M.D.   On: 10/22/2018 14:33    Allayne Stack, DO 10/23/2018, 6:50 AM PGY-1, [facility] FPTS Intern pager: [phone], text pages welcome

## 2018-10-23 NOTE — Evaluation (Signed)
Speech Language Pathology Evaluation Patient Details Name: Thomas Burch MRN: [id number] DOB: [birth year] Today's Date: 10/23/2018 Time: 5852-7782 SLP Time Calculation (min) (ACUTE ONLY): 11 min  Problem List:  Patient Active Problem List   Diagnosis Date Noted  . Aphasia   . Snoring 12/04/2017  . Expressive aphasia 05/12/2017  . H/O cerebral artery stenosis 05/12/2017  . Diabetes mellitus type II, uncontrolled (HCC), with obesity   . Hyperlipidemia   . CVA (cerebral vascular accident) (HCC) 05/02/2017  . Essential hypertension 02/01/2014  . Morbid obesity (HCC) 02/01/2014   Past Medical History:  Past Medical History:  Diagnosis Date  . 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 Diabetes mellitus type 2 with complications (HCC)   . Dysphasia 05/12/2017  . Headache   . Hypertension   . Stroke (HCC)    Infarct of left cingulate gyrus   Past Surgical History:  Past Surgical History:  Procedure Laterality Date  . TEE WITHOUT CARDIOVERSION N/A 05/04/2017   Procedure: TRANSESOPHAGEAL ECHOCARDIOGRAM (TEE);  Surgeon: Elease Hashimoto Deloris Ping, MD;  Location: [facility] ENDOSCOPY;  Service: Cardiovascular;  Laterality: N/A;   HPI:  Pt admited with CVA. MRI brain showing 53mm acute ischemic nonhemorrhagic left occipital lobe infarct and additional 22mm subaute ischemic non hemorrhagic small vessel type infarct involving right centrum semi ovale.   Assessment / Plan / Recommendation Clinical Impression  Pt presents with functional cognitive linguistic abilites as evienced by good recall of previous therapies and medical evaluations. Pt insightful on risk factors as well as s/s of stroke. Pt is oriented, able to alternate attention appropriately and complete complex problem solving. No acute needs have been identified. Education completed and ST to sign off at this time.     SLP Assessment  SLP Recommendation/Assessment: Patient does not need any further Speech  [place] [facility] SLP Visit Diagnosis: Cognitive communication deficit (R41.841)    Follow Up Recommendations  None    Frequency and Duration           SLP Evaluation Cognition  Overall Cognitive Status: Within Functional Limits for tasks assessed Arousal/Alertness: Awake/alert Orientation Level: Oriented X4       Comprehension  Auditory Comprehension Overall Auditory Comprehension: Appears within functional limits for tasks assessed Visual Recognition/Discrimination Discrimination: Within Function Limits Reading Comprehension Reading Status: Within funtional limits    Expression Expression Primary Mode of Expression: Verbal Verbal Expression Overall Verbal Expression: Appears within functional limits for tasks assessed Written Expression Dominant Hand: Right Written Expression: Not tested   Oral / Motor  Oral Motor/Sensory Function Overall Oral Motor/Sensory Function: Within functional limits Motor Speech Overall Motor Speech: Appears within functional limits for tasks assessed Respiration: Within functional limits Phonation: Normal Resonance: Within functional limits Articulation: Within functional limitis Intelligibility: Intelligible Motor Planning: Witnin functional limits Motor Speech Errors: Not applicable   GO                    Atlee Kluth 10/23/2018, 11:16 AM

## 2018-10-23 NOTE — Progress Notes (Signed)
Occupational Therapy Treatment Patient Details Name: Thomas Burch MRN: [id number] DOB: 17-May-1986 Today's Date: 10/23/2018    History of present illness 33 yo male presenting with slurred speech and AMS. PMH including CVA (04/2017), Type two DM, and HTN. MRI showing ischemic nonhemorrhagic left occipital lobe infarct and subacute infarct involving the right centrum semi ovale.   OT comments  Patient pleasant and cooperative.  Completes ADLs, transfers and mobility with independence.  Short Blessed Test completed for cognition, with patient scoring 10/28 (significant impairment) with difficulty noted in short term recall and increased time with minimal errors with sequencing months backwards. Provided patient with 4 step task to complete (brush teeth, locate room, make coffee, return to room and wash his hands).  Able to complete first task without cueing, but then requires cueing to recall each step after completing a task, with noted increased time to locate his room but no cueing required after giving patient his room number.  Reviewed short term memory strategies with patient, but patient reports he feels at his baseline (reports some deficits prior to admission). Noted BP assessed at completion of session 188/137, RN notified and aware.  Updated dc plan to OP OT to address functional cognition and short term memory further. Will continue to follow.    Follow Up Recommendations  Outpatient OT;Supervision - Intermittent    Equipment Recommendations  None recommended by OT    Recommendations for Other Services      Precautions / Restrictions Precautions Precautions: None       Mobility Bed Mobility Overal bed mobility: Independent                Transfers Overall transfer level: Modified independent   Transfers: Sit to/from Stand Sit to Stand: Modified independent (Device/Increase time)              Balance Overall balance assessment: No apparent balance deficits  (not formally assessed)                                         ADL either performed or assessed with clinical judgement   ADL Overall ADL's : Modified independent                                       General ADL Comments: performing ADls and independent level      Vision   Additional Comments: completed visual assessment with tracking and quadrants, increased time to visually locate items on R side during way finding task in hallway but able to correct and find items without cueing    Perception     Praxis      Cognition Arousal/Alertness: Awake/alert Behavior During Therapy: WFL for tasks assessed/performed Overall Cognitive Status: Impaired/Different from baseline Area of Impairment: Memory                     Memory: Decreased short-term memory         General Comments: Short blessed test completed: scoring 10/28 (significant impairment), in short term memory recall of name/address. Patient reports baseline short term memory deficits and reports this is normal for him.          Exercises     Shoulder Instructions       General Comments Engaged in 4 step recall and trail  making task- requires cueing to recall each step after inital task, after cueing to recall task able to locate inital room, make coffee, and complete ADL tasks without assist (increased time to way find and locate his room upon completion of tasks) ; noted BP assessed at completion of session 188/137, RN notified     Pertinent Vitals/ Pain       Pain Assessment: No/denies pain  Home Living                                          Prior Functioning/Environment              Frequency  Min 2X/week        Progress Toward Goals  OT Goals(current goals can now be found in the care plan section)  Progress towards OT goals: Progressing toward goals  Acute Rehab OT Goals Patient Stated Goal: "Go home" OT Goal Formulation: With  patient Time For Goal Achievement: 11/05/18 Potential to Achieve Goals: Good  Plan Discharge plan remains appropriate;Frequency remains appropriate    Co-evaluation                 AM-PAC OT "6 Clicks" Daily Activity     Outcome Measure   Help from another person eating meals?: None Help from another person taking care of personal grooming?: None Help from another person toileting, which includes using toliet, bedpan, or urinal?: None Help from another person bathing (including washing, rinsing, drying)?: None Help from another person to put on and taking off regular upper body clothing?: None Help from another person to put on and taking off regular lower body clothing?: None 6 Click Score: 24    End of Session    OT Visit Diagnosis: Other symptoms and signs involving cognitive function;Muscle weakness (generalized) (M62.81);Unsteadiness on feet (R26.81)   Activity Tolerance Patient tolerated treatment well   Patient Left with call bell/phone within reach;with nursing/sitter in room;Other (comment)(seated EOB)   Nurse Communication Mobility status;Other (comment)(increased BP, short term memory deficits)        Time: 7322-0254 OT Time Calculation (min): 18 min  Charges: OT General Charges $OT Visit: 1 Visit OT Treatments $Self Care/Home Management : 8-22 mins  Chancy Milroy, OT Acute Rehabilitation Services Pager [phone] Office [phone]     Chancy Milroy 10/23/2018, 5:09 PM

## 2018-10-23 NOTE — Progress Notes (Signed)
Informed by Dr. Pearlean Brownie that patient will need to stay overnight for CPAP tolerability testing tonight.  We will keep patient at least through tomorrow.  Appreciate communication.  Allayne Stack, DO

## 2018-10-23 NOTE — Progress Notes (Signed)
STROKE TEAM PROGRESS NOTE   INTERVAL HISTORY His RN and CM is at the bedside.  He works as a Merchandiser, retail at [employer] - there he noted onset speech difficulty which resolved after 5-6 hrs. He went home and took a nap, when he woke he had a HA. At that time his speech output was not right for a few hours.   Vitals:   10/22/18 2340 10/23/18 0427 10/23/18 0837 10/23/18 1155  BP: (!) 128/101 (!) 156/106 (!) 156/106 (!) 149/117  Pulse: (!) 101 89 (!) 107 (!) 109  Resp: 17 17 17 18   Temp: 98.3 F (36.8 C) 97.8 F (36.6 C) 98.6 F (37 C) 98.3 F (36.8 C)  TempSrc: Oral Oral Oral Oral  SpO2: 96% 98% 96% 95%  Weight:      Height:        CBC:  Recent Labs  Lab 10/21/18 2159 10/22/18 0645 10/23/18 0443  WBC 15.3* 11.9* 10.5  NEUTROABS 12.8*  --  6.0  HGB 15.4 15.2 15.9  HCT 48.1 45.7 47.7  MCV 87.9 87.9 87.7  PLT 295 318 279    Basic Metabolic Panel:  Recent Labs  Lab 10/22/18 0645 10/23/18 0443  NA 135 136  K 4.0 3.9  CL 99 98  CO2 26 29  GLUCOSE 220* 192*  BUN 14 16  CREATININE 0.98 1.09  CALCIUM 9.1 9.5   Lipid Panel:     Component Value Date/Time   CHOL 175 10/22/2018 0645   TRIG 125 10/22/2018 0645   HDL 32 (L) 10/22/2018 0645   CHOLHDL 5.5 10/22/2018 0645   VLDL 25 10/22/2018 0645   LDLCALC 118 (H) 10/22/2018 0645   HgbA1c:  Lab Results  Component Value Date   HGBA1C 11.4 (H) 10/22/2018   Urine Drug Screen:     Component Value Date/Time   LABOPIA NONE DETECTED 10/22/2018 0327   COCAINSCRNUR NONE DETECTED 10/22/2018 0327   LABBENZ NONE DETECTED 10/22/2018 0327   AMPHETMU NONE DETECTED 10/22/2018 0327   THCU NONE DETECTED 10/22/2018 0327   LABBARB NONE DETECTED 10/22/2018 0327    Alcohol Level     Component Value Date/Time   ETH <5 05/11/2017 2257    IMAGING Ct Angio Head W Or Wo Contrast  Result Date: 10/22/2018 CLINICAL DATA:  Acute LEFT occipital and subacute RIGHT posterior frontal infarcts, continued surveillance. EXAM: CT ANGIOGRAPHY  HEAD AND NECK TECHNIQUE: Multidetector CT imaging of the head and neck was performed using the standard protocol during bolus administration of intravenous contrast. Multiplanar CT image reconstructions and MIPs were obtained to evaluate the vascular anatomy. Carotid stenosis measurements (when applicable) are obtained utilizing NASCET criteria, using the distal internal carotid diameter as the denominator. CONTRAST:  54mL ISOVUE-370 IOPAMIDOL (ISOVUE-370) INJECTION 76% COMPARISON:  MR brain earlier today. CT head yesterday. FINDINGS: Initial CTA was nondiagnostic. The patient had to return, with a larger caliber IV, for repeat imaging. CTA NECK FINDINGS Aortic arch: Standard branching. Imaged portion shows no evidence of aneurysm or dissection. No significant stenosis of the major arch vessel origins. Right carotid system: No evidence of dissection, stenosis (50% or greater) or occlusion. Minor atheromatous change proximal RIGHT ICA. Left carotid system: No evidence of dissection, stenosis (50% or greater) or occlusion. Minor atheromatous change proximal LEFT ICA. Vertebral arteries: Codominant. No evidence of dissection, stenosis (50% or greater) or occlusion. Skeleton: Unremarkable. Other neck: Noncontributory. Upper chest: No mass or pneumothorax. Mediastinal lipomatosis. Review of the MIP images confirms the above findings CTA HEAD FINDINGS Anterior  circulation: No significant proximal stenosis, proximal occlusion, aneurysm, or vascular malformation. Mild irregularity of the RIGHT greater than LEFT M2 and M3 branches bilaterally, as well as the distal anterior cerebral artery branches, suggesting early intracranial atherosclerotic disease. Posterior circulation: No significant stenosis, proximal occlusion, aneurysm, or vascular malformation. Venous sinuses: As permitted by contrast timing, patent. Anatomic variants: None of significance. Delayed phase: No abnormal postcontrast enhancement. Review of the MIP  images confirms the above findings IMPRESSION: No extracranial stenosis of significance. No intracranial large vessel occlusion or proximal flow-limiting stenosis. Mild irregularity of the RIGHT greater than LEFT M2 and M3 MCA branches bilaterally, as well as distal ACA irregularity, suggesting intracranial atherosclerotic disease. Electronically Signed   By: Elsie Stain M.D.   On: 10/22/2018 14:33   Ct Head Wo Contrast  Result Date: 10/21/2018 CLINICAL DATA:  Severe headache, brief memory loss, elevated blood pressure. EXAM: CT HEAD WITHOUT CONTRAST TECHNIQUE: Contiguous axial images were obtained from the base of the skull through the vertex without intravenous contrast. COMPARISON:  Head CT dated 05/11/2017 and brain MRI dated 05/12/2017. FINDINGS: Brain: Old infarct within the LEFT thalamus. No mass, hemorrhage, edema or other evidence of acute parenchymal abnormality. No extra-axial hemorrhage. Vascular: No hyperdense vessel or unexpected calcification. Skull: Normal. Negative for fracture or focal lesion. Sinuses/Orbits: Fluid levels and mucosal thickening within the bilateral maxillary sinuses. Periorbital and retro-orbital soft tissues are unremarkable. Other: None. IMPRESSION: 1. No acute intracranial abnormality. No intracranial mass, hemorrhage or edema. 2. Old infarct within the LEFT thalamus. 3. Maxillary sinus disease, of uncertain chronicity. Electronically Signed   By: Bary Richard M.D.   On: 10/21/2018 22:36   Ct Angio Neck W Or Wo Contrast  Result Date: 10/22/2018 CLINICAL DATA:  Acute LEFT occipital and subacute RIGHT posterior frontal infarcts, continued surveillance. EXAM: CT ANGIOGRAPHY HEAD AND NECK TECHNIQUE: Multidetector CT imaging of the head and neck was performed using the standard protocol during bolus administration of intravenous contrast. Multiplanar CT image reconstructions and MIPs were obtained to evaluate the vascular anatomy. Carotid stenosis measurements (when  applicable) are obtained utilizing NASCET criteria, using the distal internal carotid diameter as the denominator. CONTRAST:  69mL ISOVUE-370 IOPAMIDOL (ISOVUE-370) INJECTION 76% COMPARISON:  MR brain earlier today. CT head yesterday. FINDINGS: Initial CTA was nondiagnostic. The patient had to return, with a larger caliber IV, for repeat imaging. CTA NECK FINDINGS Aortic arch: Standard branching. Imaged portion shows no evidence of aneurysm or dissection. No significant stenosis of the major arch vessel origins. Right carotid system: No evidence of dissection, stenosis (50% or greater) or occlusion. Minor atheromatous change proximal RIGHT ICA. Left carotid system: No evidence of dissection, stenosis (50% or greater) or occlusion. Minor atheromatous change proximal LEFT ICA. Vertebral arteries: Codominant. No evidence of dissection, stenosis (50% or greater) or occlusion. Skeleton: Unremarkable. Other neck: Noncontributory. Upper chest: No mass or pneumothorax. Mediastinal lipomatosis. Review of the MIP images confirms the above findings CTA HEAD FINDINGS Anterior circulation: No significant proximal stenosis, proximal occlusion, aneurysm, or vascular malformation. Mild irregularity of the RIGHT greater than LEFT M2 and M3 branches bilaterally, as well as the distal anterior cerebral artery branches, suggesting early intracranial atherosclerotic disease. Posterior circulation: No significant stenosis, proximal occlusion, aneurysm, or vascular malformation. Venous sinuses: As permitted by contrast timing, patent. Anatomic variants: None of significance. Delayed phase: No abnormal postcontrast enhancement. Review of the MIP images confirms the above findings IMPRESSION: No extracranial stenosis of significance. No intracranial large vessel occlusion or proximal flow-limiting stenosis. Mild  irregularity of the RIGHT greater than LEFT M2 and M3 MCA branches bilaterally, as well as distal ACA irregularity, suggesting  intracranial atherosclerotic disease. Electronically Signed   By: Elsie StainJohn T Curnes M.D.   On: 10/22/2018 14:33   Mr Brain Wo Contrast  Result Date: 10/22/2018 CLINICAL DATA:  Initial evaluation for acute headache, generalized weakness, dizziness. EXAM: MRI HEAD WITHOUT CONTRAST TECHNIQUE: Multiplanar, multiecho pulse sequences of the brain and surrounding structures were obtained without intravenous contrast. COMPARISON:  Prior CT from 10/21/2018 FINDINGS: Brain: Generalized age-appropriate cerebral volume. Mild chronic micro vessel ischemic changes present within the periventricular and deep white matter both cerebral hemispheres. Few small remote lacunar infarcts present within the bilateral basal ganglia and thalami. 8 mm acute ischemic infarct present at the left occipital lobe, left PCA territory (series 5, image 64). No associated hemorrhage or mass effect. Probable additional early subacute 4 mm small vessel type infarcts seen involving the posterior right centrum semi ovale (series 5, image 84). No other evidence for acute or recent infarction. No encephalomalacia to suggest chronic cortical infarction. No foci of susceptibility artifact to suggest acute or chronic intracranial hemorrhage. No mass lesion, midline shift or mass effect. No hydrocephalus. No extra-axial fluid collection. Pituitary gland normal. Vascular: Major intracranial vascular flow voids maintained. Skull and upper cervical spine: Craniocervical junction normal. Upper cervical spine normal. Bone marrow signal intensity within normal limits. No scalp soft tissue abnormality. Sinuses/Orbits: Globes and orbital soft tissues within normal limits. Mucosal thickening with air-fluid levels present within the maxillary sinuses bilaterally, compatible with acute sinusitis. Small left mastoid effusion noted, of doubtful significance. Inner ear structures grossly normal. Other: None. IMPRESSION: 1. 8 mm acute ischemic nonhemorrhagic left occipital  lobe infarct. 2. Additional 4 mm early subacute ischemic nonhemorrhagic small vessel type infarct involving the right centrum semi ovale. 3. Underlying mild chronic microvascular ischemic disease with scattered remote lacunar infarcts involving the bilateral basal ganglia and thalami. 4. Acute maxillary sinusitis. Electronically Signed   By: Rise MuBenjamin  McClintock M.D.   On: 10/22/2018 02:06    PHYSICAL EXAM  obese young Caucasian male not in distress. . Afebrile. Head is nontraumatic. Neck is supple without bruit.    Cardiac exam no murmur or gallop. Lungs are clear to auscultation. Distal pulses are well felt. Neurological Exam ;  Awake  Alert oriented x 3. Normal speech and language.eye movements full without nystagmus.fundi were not visualized. Vision acuity and fields appear normal. Hearing is normal. Palatal movements are normal. Face symmetric. Tongue midline. Normal strength, tone, reflexes and coordination. Normal sensation. Gait deferred.   ASSESSMENT/PLAN [name] is a 33 y.o. male with history of DB, prior stroke 2018 presenting with speech disturbance.   Stroke:  left occipital and right centrum semiovale  Infarcts secondary to small vessel disease source in setting on uncontrolled risk factors  CT head  No acute stroke. 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 Maxillary sinus dz.     MRI  L occipital infarct. subacute R CSO infarct.  Small vessel disease. Old B BG and thalamic lacunes. Acute maxillary sinusitis  CTA head & neck no significant stenosis.  Mild irregularity R > L M2 and M3 branches bilaterally  2D Echo  pending   LDL 118  HgbA1c 11.9  Lovenox 40 mg sq daily for VTE prophylaxis  No antithrombotic (prescribed but he ran out and did not get refilled) prior to admission, now on aspirin 325 mg daily and clopidogrel 75 mg daily x 3 months then ASPIRIN alone (given  IC stenosis)  Therapy recommendations:  No therapy needs  Disposition:  Return  home  Hypertension  Stable . BP goal normotensive  Hyperlipidemia  Home meds:  lipitor 80, resumed in hospital  LDL 118, goal < 70  Continue statin at discharge  Diabetes type II  HgbA1c 11.9, goal < 7.0  Uncontrolled  Other Stroke Risk Factors  Obesity, Body mass index is 31.57 kg/m., recommend weight loss, diet and exercise as appropriate   Hx stroke/TIA  05/2017 - Stroke: Acute/subacute infarct left anterior thalamus and posterior internal capsule as noted on recent CT. 5 mm acute infarct left cingulate gyrus, secondary to small vessel disease, in the setting of poorly controlled hypertension, previously untreated diabetes, and recent stroke  04/2017 - Stroke:  Right CR, right temporal and left splenium punctate infarcts, embolic work up so far negative. Could be synchronized small vessel disease, however, recommend 30 day cardiac event monitoring as outpt to rule out afib.  Family hx stroke (father)  [facility]ores - Patient interested in participating in the SLEEP SMART STROKE   trial. [facility] will follow up for possible enrollment. Please contact them at [phone] for any questions.    Other Active Problems  Abn EKG, trop neg  Hospital day # 1     He presented with transient episode of speech disturbance and MRI scan shows a corner new infarcts. He is had prior presentations with strokes with extensive negative workup for cardiac source of embolism, A. Fib, hypercoagulable state, vasculitis, PFO and his previous strokes have been felt to be secondary to small vessel disease with uncontrolled risk factors. He appears to be at high risk for sleep apnea and is   participating in the sleep smart stroke trial and tested positive in [place] 3 screening last night and will undergo CPAP tolerability test tonight..Continue dual antiplatelet therapy and aggressive risk factor modification. D/W patient and family practice team Greater than 50% time during this  25 minute visit was spent on counseling and coordination of care about his multiple lacunar infarcts and discussion about stroke treatment, prevention and answering questions Delia Heady, MD Medical Director [facility] Pager: [phone] 10/23/2018 1:22 PM  To contact Stroke Continuity provider, please refer to [email or web address]. After hours, contact General Neurology

## 2018-10-24 ENCOUNTER — Telehealth: Payer: Self-pay

## 2018-10-24 LAB — CBC WITH DIFFERENTIAL/PLATELET
ABS IMMATURE GRANULOCYTES: 0.04 10*3/uL (ref 0.00–0.07)
BASOS PCT: 1 %
Basophils Absolute: 0.1 10*3/uL (ref 0.0–0.1)
Eosinophils Absolute: 0.1 10*3/uL (ref 0.0–0.5)
Eosinophils Relative: 1 %
HCT: 47 % (ref 39.0–52.0)
Hemoglobin: 15.3 g/dL (ref 13.0–17.0)
Immature Granulocytes: 0 %
Lymphocytes Relative: 33 %
Lymphs Abs: 3.6 10*3/uL (ref 0.7–4.0)
MCH: 28.5 pg (ref 26.0–34.0)
MCHC: 32.6 g/dL (ref 30.0–36.0)
MCV: 87.5 fL (ref 80.0–100.0)
Monocytes Absolute: 0.8 10*3/uL (ref 0.1–1.0)
Monocytes Relative: 7 %
Neutro Abs: 6.3 10*3/uL (ref 1.7–7.7)
Neutrophils Relative %: 58 %
Platelets: 286 10*3/uL (ref 150–400)
RBC: 5.37 MIL/uL (ref 4.22–5.81)
RDW: 12 % (ref 11.5–15.5)
WBC: 10.9 10*3/uL — ABNORMAL HIGH (ref 4.0–10.5)
nRBC: 0 % (ref 0.0–0.2)

## 2018-10-24 LAB — BASIC METABOLIC PANEL
Anion gap: 11 (ref 5–15)
BUN: 19 mg/dL (ref 6–20)
CO2: 26 mmol/L (ref 22–32)
Calcium: 9.2 mg/dL (ref 8.9–10.3)
Chloride: 99 mmol/L (ref 98–111)
Creatinine, Ser: 0.93 mg/dL (ref 0.61–1.24)
GFR calc Af Amer: >60 mL/min (ref >60)
Glucose, Bld: 191 mg/dL — ABNORMAL HIGH (ref 70–99)
POTASSIUM: 3.7 mmol/L (ref 3.5–5.1)
Sodium: 136 mmol/L (ref 135–145)

## 2018-10-24 LAB — GLUCOSE, CAPILLARY
Glucose-Capillary: 194 mg/dL — ABNORMAL HIGH (ref 70–99)
Glucose-Capillary: 273 mg/dL — ABNORMAL HIGH (ref 70–99)

## 2018-10-24 MED ORDER — ASPIRIN 325 MG PO TABS: 325.0000 mg | ORAL_TABLET | Freq: Every day | ORAL | 0 refills | Status: DC

## 2018-10-24 MED ORDER — INSULIN GLARGINE (2 UNIT DIAL) 300 UNIT/ML ~~LOC~~ SOPN: 15.0000 [IU] | PEN_INJECTOR | Freq: Every day | SUBCUTANEOUS | 5 refills | Status: DC

## 2018-10-24 MED ORDER — LISINOPRIL 40 MG PO TABS: 40.0000 mg | ORAL_TABLET | Freq: Every day | ORAL | 0 refills | Status: DC

## 2018-10-24 MED ORDER — SEMAGLUTIDE(0.25 OR 0.5MG/DOS) 2 MG/1.5ML ~~LOC~~ SOPN: 0.2500 mg | PEN_INJECTOR | SUBCUTANEOUS | 5 refills | Status: DC

## 2018-10-24 MED ORDER — INSULIN GLARGINE (1 UNIT DIAL) 300 UNIT/ML ~~LOC~~ SOPN: 15.0000 [IU] | PEN_INJECTOR | Freq: Every day | SUBCUTANEOUS | 5 refills | Status: AC

## 2018-10-24 MED ORDER — LISINOPRIL 20 MG PO TABS
40.0000 mg | ORAL_TABLET | Freq: Once | ORAL | Status: AC
  Administered 2018-10-24: 40 mg via ORAL
  Filled 2018-10-24: qty 2

## 2018-10-24 NOTE — Progress Notes (Signed)
Inpatient Diabetes Program Recommendations  AACE/ADA: New Consensus Statement on Inpatient Glycemic Control (2015)  Target Ranges:  Prepandial:   less than 140 mg/dL      Peak postprandial:   less than 180 mg/dL (1-2 hours)      Critically ill patients:  140 - 180 mg/dL    Diabetes history: Type 2 DM Outpatient Diabetes medications: Lantus 13 units QD, Metformin 500 mg QD Current orders for Inpatient glycemic control: Lantus 10 units QHS, Novolog 0-15 units TID, Novolog 0-5 units QHS  Inpatient Diabetes Program Recommendations:    Consider increasing Lantus to 16 units QHS (99.8 kg x 0.16).   Thanks, Christena Deem RN, MSN, BC-ADM Inpatient Diabetes Coordinator Team Pager [phone] (8a-5p)

## 2018-10-24 NOTE — Progress Notes (Signed)
Family Medicine Teaching Service Daily Progress Note Intern Pager: [phone]  Patient name: Thomas Burch Medical record number: [id number] Date of birth: 16-Sep-1985 Age: 33 y.o. Gender: male  Primary Care Provider: Shirley, [place], DO Consultants: Neuro Code Status: Full  Assessment and Plan: hillip D Orecchio is a 33 y.o. male admitted for CVA.  His chronic conditions include poorly controlled DM2, HLD, Obesity.   Dysarthria secondary to CVA: Acute, resolved  Patient has returned to his baseline and denies any neurologic deficits. Will continue DAPT and risk factors management going forward. Will follow up with Neurology in the outpatient setting. Patient tolerated CPAP machine well overnight given likely OSA. Patient is enrolled in a study with neurology. --Neurology on board, appreciate continued recommendations -Continue aspirin 325 and Plavix 75 mg -Continue atorvastatin 80 mg -Continue to allow for permissive hypertension  Abnormal EKG  Patient without chest pain or anginal symptoms.  Troponins negative. -Repeat EKG  DM II, Uncontrolled A1c 11.4. Fasting this am 194.  Required 16 units of short acting insulin over the last 24 hours.  History of noncompliance, reports taking metformin 500 mg, Lantus 11 units. - Moderate SSI - discharge on Lantus 15 units - Patient has agreed to start Ozempic based on cost, he will receive education from our pharmacy team prior to discharge.   HTN SBP 140- 160s, DBP consistently >110.  Home medications: lisinopril 20mg , clonidine, amlodipine 10mg . Have allowed permissive HTN for the past 48 hrs. --Resume amlodipine 10 mg daily today --Will resume lisinopril 20 mg daily on discharge   HLD LDL 118, question noncompliance of home atorvastatin. -Continue atorvastatin 80mg    FEN/GI:  . Fluids: none  . Electrolytes: wnl  . Nutrition: Heart healthy/carb modified  VTE prophylaxis: Lovenox 40mg    Disposition: Patient is stable an ready for  discharge today.  Subjective:  Mild headaches this morning but otherwise has no acute complaints this monring. Feels back to his baseline.  Denies any chest pain, shortness of breath, coughing, weakness/numbness, or tingling.  Has been able to speak normally. Patient will take 2 days off before returning to work.  Objective: Temp:  [97.9 F (36.6 C)-98.6 F (37 C)] 97.9 F (36.6 C) (02/19 0752) Pulse Rate:  [88-109] 90 (02/19 0752) Resp:  [18-22] 22 (02/19 0752) BP: (147-155)/(110-117) 155/117 (02/19 0752) SpO2:  [95 %-100 %] 97 % (02/19 0752)   Physical Exam: General: Alert, NAD HEENT: NCAT, MMM, oropharynx nonerythematous  Cardiac: RRR no m/g/r Lungs: Clear bilaterally, no increased WOB  Abdomen: soft large abdomen, non-tender, non-distended, normoactive BS Msk: Moves all extremities spontaneously  Ext: Warm, dry, 2+ distal pulses, no edema  Neuro: Alert and oriented, no focal neuro deficits noted.  Speech appropriate.   Laboratory: Recent Labs  Lab 10/22/18 0645 10/23/18 0443 10/24/18 0408  WBC 11.9* 10.5 10.9*  HGB 15.2 15.9 15.3  HCT 45.7 47.7 47.0  PLT 318 279 286   Recent Labs  Lab 10/21/18 2159 10/22/18 0645 10/23/18 0443 10/24/18 0408  NA 132* 135 136 136  K 4.6 4.0 3.9 3.7  CL 97* 99 98 99  CO2 23 26 29 26   BUN 20 14 16 19   CREATININE 1.12 0.98 1.09 0.93  CALCIUM 9.7 9.1 9.5 9.2  PROT 7.2  --   --   --   BILITOT 1.0  --   --   --   ALKPHOS 65  --   --   --   ALT 47*  --   --   --  AST 26  --   --   --   GLUCOSE 283* 220* 192* 191*     Imaging/Diagnostic Tests: No results found.  Lovena Neighbours, MD 10/24/2018, 9:01 AM PGY-3, [facility] FPTS Intern pager: [phone], text pages welcome

## 2018-10-24 NOTE — Telephone Encounter (Signed)
Fax from [facility] for PA on Ozempic. This is non-preferred on insurance.  Per CoverMyMeds, preferred alternatives are:  METFORMIN HCL ER TABS 500MG  METFORMIN HCL ER TABS 750MG  BYETTA INJECT/PEN  BYETTA INJECT/PEN . BYDUREON PEN INJECTOR 4'S 2MG  TRULICITY SD PEN 0.5ML 4'S 0. 0.75MG  TRULICITY SD PEN 0.5ML 4'S 1. 1.5MG  BYDUREON BCISE AUTOIJ 0.85ML4 2MG   Please send preferred alternative or let RN clinic know if you want to attempt a PA.  Ples Specter, RN [facility] RN)

## 2018-10-24 NOTE — Progress Notes (Signed)
   10/24/18 1300  Clinical Encounter Type  Visited With Patient  Visit Type Initial  Referral From Nurse  Consult/Referral To Chaplain  Spiritual Encounters  Spiritual Needs Emotional;Prayer  Stress Factors  Patient Stress Factors Health changes;Major life changes   Responded to spiritual care consult for an AD. PT was alert and requested a copy. After giving PT a copy and giving an overview of the AD, PT stated concern for his health and stated that he wants to be prepared for what may come in the future. He mentioned he has a good family support system. Also, he was very thankful for the Chaplain visit. I offered words of comfort, empathic listening, ministry of presence, and prayer. Chaplain available upon request.  Chaplain Orest Dikes [phone]

## 2018-10-24 NOTE — Progress Notes (Signed)
Pt being discharged from hospital per orders from MD. Pt educated on discharge instructions. Pt verbalized understanding of instructions. All questions and concerns were addressed. Pt's IV was removed prior to discharge. Pt exited hospital via ambulation to emergency room exit accompanied by staff.

## 2018-10-24 NOTE — Progress Notes (Signed)
STROKE TEAM PROGRESS NOTE   INTERVAL HISTORY No one is at the bedside.  He was able to tolerate CPAP mask last night. He has been randomized to the CPAP treatment arm  of the sleep smart study   Vitals:   10/23/18 2000 10/24/18 0550 10/24/18 0752 10/24/18 1341  BP: (!) 147/110 (!) 153/115 (!) 155/117 (!) 173/122  Pulse: (!) 105 88 90 (!) 120  Resp: 20 18 (!) 22 (!) 22  Temp: 98.6 F (37 C) 98.2 F (36.8 C) 97.9 F (36.6 C) 98.7 F (37.1 C)  TempSrc: Oral Oral Oral Oral  SpO2: 97% 100% 97% 99%  Weight:      Height:        CBC:  Recent Labs  Lab 10/23/18 0443 10/24/18 0408  WBC 10.5 10.9*  NEUTROABS 6.0 6.3  HGB 15.9 15.3  HCT 47.7 47.0  MCV 87.7 87.5  PLT 279 286    Basic Metabolic Panel:  Recent Labs  Lab 10/23/18 0443 10/24/18 0408  NA 136 136  K 3.9 3.7  CL 98 99  CO2 29 26  GLUCOSE 192* 191*  BUN 16 19  CREATININE 1.09 0.93  CALCIUM 9.5 9.2   Lipid Panel:     Component Value Date/Time   CHOL 175 10/22/2018 0645   TRIG 125 10/22/2018 0645   HDL 32 (L) 10/22/2018 0645   CHOLHDL 5.5 10/22/2018 0645   VLDL 25 10/22/2018 0645   LDLCALC 118 (H) 10/22/2018 0645   HgbA1c:  Lab Results  Component Value Date   HGBA1C 11.4 (H) 10/22/2018   Urine Drug Screen:     Component Value Date/Time   LABOPIA NONE DETECTED 10/22/2018 0327   COCAINSCRNUR NONE DETECTED 10/22/2018 0327   LABBENZ NONE DETECTED 10/22/2018 0327   AMPHETMU NONE DETECTED 10/22/2018 0327   THCU NONE DETECTED 10/22/2018 0327   LABBARB NONE DETECTED 10/22/2018 0327    Alcohol Level     Component Value Date/Time   ETH <5 05/11/2017 2257    IMAGING No results found.  PHYSICAL EXAM  obese young Caucasian male not in distress. . Afebrile. Head is nontraumatic. Neck is supple without bruit.    Cardiac exam no murmur or gallop. Lungs are clear to auscultation. Distal pulses are well felt. Neurological Exam ;  Awake  Alert oriented x 3. Normal speech and language.eye movements full  without nystagmus.fundi were not visualized. Vision acuity and fields appear normal. Hearing is normal. Palatal movements are normal. Face symmetric. Tongue midline. Normal strength, tone, reflexes and coordination. Normal sensation. Gait deferred.   ASSESSMENT/PLAN [name] is a 33 y.o. male with history of DB, prior stroke 2018 presenting with speech disturbance.   Stroke:  left occipital and right centrum semiovale  Infarcts secondary to small vessel disease source in setting on uncontrolled risk factors  CT head  No acute stroke. 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 Maxillary sinus dz.     MRI  L occipital infarct. subacute R CSO infarct.  Small vessel disease. Old B BG and thalamic lacunes. Acute maxillary sinusitis  CTA head & neck no significant stenosis.  Mild irregularity R > L M2 and M3 branches bilaterally  2D Echo  Left ventricular ejection fraction 60-65 percent. No wall motion abnormalities.   LDL 118  HgbA1c 11.9  Lovenox 40 mg sq daily for VTE prophylaxis  No antithrombotic (prescribed but he ran out and did not get refilled) prior to admission, now on aspirin 325 mg daily and clopidogrel 75 mg daily  x 3 months then ASPIRIN alone (given IC stenosis)  Therapy recommendations:  No therapy needs  Disposition:  Return home  Hypertension  Stable . BP goal normotensive  Hyperlipidemia  Home meds:  lipitor 80, resumed in hospital  LDL 118, goal < 70  Continue statin at discharge  Diabetes type II  HgbA1c 11.9, goal < 7.0  Uncontrolled  Other Stroke Risk Factors  Obesity, Body mass index is 31.57 kg/m., recommend weight loss, diet and exercise as appropriate   Hx stroke/TIA  05/2017 - Stroke: Acute/subacute infarct left anterior thalamus and posterior internal capsule as noted on recent CT. 5 mm acute infarct left cingulate gyrus, secondary to small vessel disease, in the setting of poorly controlled hypertension, previously untreated diabetes, and recent  stroke  04/2017 - Stroke:  Right CR, right temporal and left splenium punctate infarcts, embolic work up so far negative. Could be synchronized small vessel disease, however, recommend 30 day cardiac event monitoring as outpt to rule out afib.  Family hx stroke (father)  [place]ores - Patient  is participating in the SLEEP SMART STROKE   Trial and has been randomized to CPAP treatment. [facility] will follow up for possible enrollment. Please contact them at [phone] for any questions.    Other Active Problems  Abn EKG, trop neg  Hospital day # 2     He presented with transient episode of speech disturbance and MRI scan shows a corner new infarcts. He is had prior presentations with strokes with extensive negative workup for cardiac source of embolism, A. Fib, hypercoagulable state, vasculitis, PFO and his previous strokes have been felt to be secondary to small vessel disease with uncontrolled risk factors. He he has been diagnosed to have sleep apnea on portable overnight sleep monitor and is   participating in the sleep smart stroke trial and has been randomized to the CPAP treatment arm.Marland KitchenMarland KitchenContinue dual antiplatelet therapy and aggressive risk factor modification. D/W patient and family practice team Greater than 50% time during this 25 minute visit was spent on counseling and coordination of care about his multiple lacunar infarcts and discussion about stroke treatment, prevention and answering questions. He will follow-up in the stroke research clinic as scheduled Delia Heady, MD Medical Director [facility] Pager: [phone] 10/24/2018 3:02 PM  To contact Stroke Continuity provider, please refer to [email or web address]. After hours, contact General Neurology

## 2018-10-24 NOTE — Care Management Note (Addendum)
Case Management Note  Patient Details  Name: Thomas Burch MRN: [id number] Date of Birth: Apr 21, 1986  Subjective/Objective:     Pt admitted with a stroke. He is from home with his grandmother.  DME: none Pt has had issues affording his long acting insulin, but not any issues with his other meds.  No issues with transportation.               Action/Plan: CM consulted for long acting insulin benefits checks and this result is in the notes. DM is recommending Toujeo pens per their note. CM following for d/c medications. CM also consulted for Outpatient therapy. Pt is interested in attending [facility] rehab. Orders in Epic and information on the AVS. Pt has transportation home when medically ready.  Addendum: CM provided him coupons for Toujeo and Ozempic he can use with his insurance.   Expected Discharge Date:                  Expected Discharge Plan:  OP Rehab  In-House Referral:     Discharge planning Services  CM Consult  Post Acute Care Choice:    Choice offered to:  Patient  DME Arranged:    DME Agency:     HH Arranged:    HH Agency:     Status of Service:  Completed, signed off  If discussed at [employer] of Stay Meetings, dates discussed:    Additional Comments:  Kermit Balo, RN 10/24/2018, 11:19 AM

## 2018-10-24 NOTE — Progress Notes (Signed)
Occupational Therapy Treatment Patient Details Name: Thomas Burch MRN: [id number] DOB: 11-07-1985 Today's Date: 10/24/2018    History of present illness 33 yo male presenting with slurred speech and AMS. PMH including CVA (04/2017), Type two DM, and HTN. MRI showing ischemic nonhemorrhagic left occipital lobe infarct and subacute infarct involving the right centrum semi ovale.   OT comments  Patient engaged in cognitive session.  Patient reports "feeling much better today", voicing "I just don't have those moments where I feel fuzzy", when asked to explain in further detail voices "I feel like I"m thinking straighter".  Patient completed visual scanning letter and number cancellation tasks, with 100% accuracy on numbers and 1 error on letters (large number and letters, will need to progress to small more distracting forms) with good attention, scanning technique, completed pill box test with 100% accuracy but required 7 minutes (over the allowed 5 minute limit). Educated on memory strategies for work and home, especially with work place rest breaks, organization, and safety (provided handout).  Educated on BE FAST for stroke symptoms.  Continue to recommend OP neuro OT and added neuropsych to optimize independence with IADLs and higher level cognitive function.     Follow Up Recommendations  Outpatient OT;Supervision - Intermittent    Equipment Recommendations  None recommended by OT    Recommendations for Other Services Other (comment)(neuropsych )    Precautions / Restrictions Precautions Precautions: None       Mobility Bed Mobility Overal bed mobility: Independent                Transfers                      Balance Overall balance assessment: No apparent balance deficits (not formally assessed)                                         ADL either performed or assessed with clinical judgement   ADL                                                Vision   Additional Comments: completed visual scanning worksheets, letter and number cancellation.    Perception     Praxis      Cognition Arousal/Alertness: Awake/alert Behavior During Therapy: WFL for tasks assessed/performed Overall Cognitive Status: Impaired/Different from baseline Area of Impairment: Memory                     Memory: Decreased short-term memory                  Exercises     Shoulder Instructions       General Comments pill box test     Pertinent Vitals/ Pain       Pain Assessment: No/denies pain  Home Living                                          Prior Functioning/Environment              Frequency  Min 2X/week        Progress Toward Goals  OT  Goals(current goals can now be found in the care plan section)  Progress towards OT goals: Progressing toward goals  Acute Rehab OT Goals Patient Stated Goal: "Go home" OT Goal Formulation: With patient Time For Goal Achievement: 11/05/18 Potential to Achieve Goals: Good  Plan Discharge plan remains appropriate;Frequency remains appropriate    Co-evaluation                 AM-PAC OT "6 Clicks" Daily Activity     Outcome Measure   Help from another person eating meals?: None Help from another person taking care of personal grooming?: None Help from another person toileting, which includes using toliet, bedpan, or urinal?: None Help from another person bathing (including washing, rinsing, drying)?: None Help from another person to put on and taking off regular upper body clothing?: None Help from another person to put on and taking off regular lower body clothing?: None 6 Click Score: 24    End of Session    OT Visit Diagnosis: Other symptoms and signs involving cognitive function;Muscle weakness (generalized) (M62.81);Unsteadiness on feet (R26.81)   Activity Tolerance Patient tolerated treatment well   Patient  Left in bed;with call bell/phone within reach   Nurse Communication Mobility status        Time: 1962-2297 OT Time Calculation (min): 24 min  Charges: OT General Charges $OT Visit: 1 Visit OT Treatments $Cognitive Funtion inital: Initial 15 mins $Cognitive Funtion additional: Additional15 mins  Chancy Milroy, OT Acute Rehabilitation Services Pager [phone] Office [phone]     Chancy Milroy 10/24/2018, 2:41 PM

## 2018-10-25 ENCOUNTER — Other Ambulatory Visit: Payer: Self-pay | Admitting: Family Medicine

## 2018-10-25 MED ORDER — DULAGLUTIDE 0.75 MG/0.5ML ~~LOC~~ SOAJ: 0.7500 mg | SUBCUTANEOUS | 2 refills | Status: DC

## 2018-10-25 NOTE — Telephone Encounter (Signed)
Alternative sent by PCP. Ples Specter, RN [facility] RN)

## 2018-10-25 NOTE — Telephone Encounter (Signed)
I am coming into the office to discuss with pharmacy as they tried to set this up inpatient and will keep you updated. Thank you.

## 2018-10-25 NOTE — Telephone Encounter (Signed)
Patient called to check status. Alisa Brake, RN ([facility] RN)  

## 2018-10-25 NOTE — Progress Notes (Signed)
Ozempic is not covered by insurance.  It would be preferable for weight loss however will trial Trulicity which is covered by his insurance.  We will start patient on 0.75 mg of Trulicity weekly.  Pharmacy will instruct patient during clinic visit tomorrow at 10:30 AM on how to use Trulicity appropriately.  Dr. Primitivo Gauze made aware.  [place] Lashan Macias, DO PGY-2, [facility]

## 2018-10-26 ENCOUNTER — Ambulatory Visit (INDEPENDENT_AMBULATORY_CARE_PROVIDER_SITE_OTHER): Payer: Commercial Managed Care - PPO | Admitting: Family Medicine

## 2018-10-26 ENCOUNTER — Other Ambulatory Visit: Payer: Self-pay

## 2018-10-26 ENCOUNTER — Telehealth: Payer: Self-pay | Admitting: *Deleted

## 2018-10-26 VITALS — BP 124/80 | HR 88 | Temp 98.0°F | Ht 70.0 in | Wt 232.0 lb

## 2018-10-26 DIAGNOSIS — E1165 Type 2 diabetes mellitus with hyperglycemia: Secondary | ICD-10-CM

## 2018-10-26 DIAGNOSIS — Z09 Encounter for follow-up examination after completed treatment for conditions other than malignant neoplasm: Secondary | ICD-10-CM | POA: Insufficient documentation

## 2018-10-26 DIAGNOSIS — I10 Essential (primary) hypertension: Secondary | ICD-10-CM | POA: Diagnosis not present

## 2018-10-26 DIAGNOSIS — I639 Cerebral infarction, unspecified: Secondary | ICD-10-CM

## 2018-10-26 DIAGNOSIS — E785 Hyperlipidemia, unspecified: Secondary | ICD-10-CM

## 2018-10-26 MED ORDER — DULAGLUTIDE 0.75 MG/0.5ML ~~LOC~~ SOAJ: 0.7500 mg | SUBCUTANEOUS | 0 refills | Status: DC

## 2018-10-26 NOTE — Assessment & Plan Note (Signed)
Doing very well post CVA.  No residual deficit.  Stressed importance of rest better control.

## 2018-10-26 NOTE — Assessment & Plan Note (Signed)
BP 125/80.  Continue lisinopril 40 mg.  Continue amlodipine 10 mg daily.

## 2018-10-26 NOTE — Patient Instructions (Addendum)
It was great meeting you today!  I am sorry you had your stroke but I am glad you do not have any residual deficits.  We discussed the risk factors for having strokes.  Fortunately your blood pressure and your cholesterol look good.  Visit your major risk factors are diabetes.  We discussed a hemoglobin A1c and why this test is important, and why we do this every 3 months.  Due to insurance coverage we are switching her Ozempic to Trulicity.  He received teaching for how to use this.  Insurance should cover this, please let us know if they do not.  In regards to your follow-up, please make an appointment to see Dr. Talbert Forest in 3 months.  We will do another hemoglobin A1c at that visit.  If possible we can go up on your metformin dose at that time as well.  I will add your continuous glucose monitor is going well.   In regards to getting advice on diet and exercise.  There is an opening with our nutrition clinic on Thursday at 9 AM.  There is absolutely no pressure to attend this, but what we are looking for patient feels lot.  People you will build to get off work and will be beneficial please let me know and I can get you scheduled.  With if any questions or concerns your medications or in general please let myself or Dr. Talbert Forest know through [place].  You can also call the clinic and will be happy to call you back.

## 2018-10-26 NOTE — Progress Notes (Signed)
   HPI 33 year old Caucasian male who presents for hospital follow-up.  Patient admitted for 8 mm acute ischemic nonhemorrhagic left occipital lobe infarct.  Symptoms were dysarthria.  This is a second stroke the same symptoms.  They resolved prior to leaving hospital.  Patient's major risk factors hypertension, cholesterol, diabetes.  Blood pressure and cholesterol well controlled.  Hemoglobin A1c around 11.  He is taking metformin 500 mg twice daily.  He was discharged on Ozempic, but insurance would not pay for this.  Per their formulary switch to Trulicity.  Patient is having no issues and has no residual deficits.  Has been doing well.  Been able to get all the medications aside from the Ozempic/Trulicity as above.  He received teaching on how to use his Trulicity from pharmacy team during visit.  CC: Hospital follow-up   ROS:   Review of Systems See HPI for ROS.   CC, SH/smoking status, and VS noted  Objective: BP 124/80   Pulse 88   Temp 98 F (36.7 C) (Oral)   Ht 5\' 10"  (1.778 m)   Wt 232 lb (105.2 kg)   SpO2 99%   BMI 33.29 kg/m  Gen: Well-appearing 33 year old Caucasian male, no acute distress, resting comfortably HEENT: EOMI, PERRLA.  Tongue midline CV: Regular rate and rhythm, no M/R/G.  Palpable radial pulse laterally Resp: Lungs clear to auscultation bilaterally, no wheezing, no respiratory distress Abd: SNTND, BS present, no guarding or organomegaly Neuro: CN II through XII intact.  5 out of strength bilateral upper, bilateral lower extremity.  No gait abnormality.  No speech problems. Abdomen: Nontender, nondistended.   Assessment and plan:  Hospital discharge follow-up Doing very well post CVA.  No residual deficit.  Stressed importance of rest better control.  Diabetes mellitus type II, uncontrolled (HCC), with obesity Metformin 500 mg twice daily.  This is been switched to Trulicity.  Received teaching on how to use Trulicity.  Additional Trulicity order  placed in pharmacy.  Trulicity 0.5 mg weekly.  Gave counseling on diet and exercise.  Offered patient visit in nutrition clinic, he will consider and let me know if he is able to get off work.  Follow-up A1c in 3 months.  At that visit can likely titrate up metformin and possibly Trulicity.  Essential hypertension BP 125/80.  Continue lisinopril 40 mg.  Continue amlodipine 10 mg daily.  CVA (cerebral vascular accident) [facility]) Status post CVA.  Continue aspirin 325 mg daily, continue Plavix 75 mg daily.  Follow-up with neurology as scheduled.  No residual deficit.  Hyperlipidemia LDL 118, HDL 32.  Total cholesterol within normal limits.  Continue atorvastatin 80 mg for statin control.   No orders of the defined types were placed in this encounter.   Meds ordered this encounter  Medications  . Dulaglutide (TRULICITY) 0.75 MG/0.5ML SOPN    Sig: Inject 0.75 mg into the skin once a week.    Dispense:  4 pen    Refill:  0     Myrene Buddy MD PGY-2 Family Medicine Resident  10/26/2018 12:22 PM

## 2018-10-26 NOTE — Discharge Summary (Signed)
Family Medicine Teaching [facility] Discharge Summary  Patient name: Thomas Burch Medical record number: [id number] Date of birth: September 19, 1985 Age: 33 y.o. Gender: male Date of Admission: 10/21/2018  Date of Discharge: 10/24/2018  Admitting Physician: Leighton Roach McDiarmid, MD  Primary Care Provider: Shirley, [place], DO Consultants: Neurology   Indication for Hospitalization: Left occipital and right centrum semiovale  Infarcts secondary to small vessel disease source in setting on uncontrolled risk factors  Discharge Diagnoses/Problem List:  Patient Active Problem List   Diagnosis Date Noted  . Hospital discharge follow-up 10/26/2018  . Aphasia   . Snoring 12/04/2017  . Expressive aphasia 05/12/2017  . H/O cerebral artery stenosis 05/12/2017  . Diabetes mellitus type II, uncontrolled (HCC), with obesity   . Hyperlipidemia   . CVA (cerebral vascular accident) (HCC) 05/02/2017  . Essential hypertension 02/01/2014  . Morbid obesity (HCC) 02/01/2014     Disposition: Home  Discharge Condition: Improved  Discharge Exam:  General: Alert, NAD HEENT: NCAT, MMM, oropharynx nonerythematous  Cardiac: RRR no m/g/r Lungs: Clear bilaterally, no increased WOB  Abdomen: soft large abdomen, non-tender, non-distended, normoactive BS Msk: Moves all extremities spontaneously  Ext: Warm, dry, 2+ distal pulses, no edema  Neuro: Alert and oriented, no focal neuro deficits noted.  Speech appropriate.  Brief Hospital Course:  Thomas Burch is a 33 y.o. male who presents with AMS secondary to a stroke found on MRI. The patient had a left occipital and right centrum semiovale infarcts secondary to small vessel disease source in setting on uncontrolled risk factors including DM, HTN, HLD. He also ran out of his antiplatelet therapy. Neurology was consulted. They recommended CTA Head, Echocardiogram, SLP, PT, OT, risk start labs. Patient's AMS resolved quickly. PT recommended no follow up. OT  recommended outpatient OT. Patient had elevated A1C of 11.7. He was CBGs were aggressively regulated with insulin. Diabetes education was also provided. Patient lipids were at goal and he was already on a high intensity statin. Neurology recommended DAPT therapy ASA 325 + Plavix for 3 months then just ASA. Patient was allowed permissive HTN for 5 days with slow correction of BP. Patient's BP was controlled well on discharge. Patient's clonidine was discontinued on discharge as he had not been taking it prior to admission.   Issues for Follow Up:  1. Pt snores. Could benefit from Sleep study for OSA evaluation 2. Patient could benefit from maximized diabetic oral therapies GLP1 agonist or SGLT2 inhibitors  Significant Procedures:   Procedure Orders     ED EKG     EKG 12-Lead     EKG     EKG 12-Lead     ECHOCARDIOGRAM COMPLETE   Significant Labs and Imaging:  Recent Labs  Lab 10/22/18 0645 10/23/18 0443 10/24/18 0408  WBC 11.9* 10.5 10.9*  HGB 15.2 15.9 15.3  HCT 45.7 47.7 47.0  PLT 318 279 286   Recent Labs  Lab 10/21/18 2159 10/22/18 0645 10/23/18 0443 10/24/18 0408  NA 132* 135 136 136  K 4.6 4.0 3.9 3.7  CL 97* 99 98 99  CO2 23 26 29 26   GLUCOSE 283* 220* 192* 191*  BUN 20 14 16 19   CREATININE 1.12 0.98 1.09 0.93  CALCIUM 9.7 9.1 9.5 9.2  ALKPHOS 65  --   --   --   AST 26  --   --   --   ALT 47*  --   --   --   ALBUMIN 4.2  --   --   --  Lab Results  Component Value Date   HGBA1C 11.4 (H) 10/22/2018   Lab Results  Component Value Date   CHOL 175 10/22/2018   HDL 32 (L) 10/22/2018   LDLCALC 118 (H) 10/22/2018   TRIG 125 10/22/2018   CHOLHDL 5.5 10/22/2018   Lab Results  Component Value Date   TSH 0.493 04/29/2017      Echocardiogram 10/22/2018  IMPRESSIONS  1. The left ventricle has normal systolic function with an ejection fraction of 60-65%. The cavity size was normal. There is mildly increased left ventricular wall thickness. Left ventricular  diastolic Doppler parameters are consistent with impaired  relaxation Indeterminent filling pressures The E/e' is 8-15.  2. The right ventricle has normal systolic function. The cavity was normal. There is no increase in right ventricular wall thickness.  3. Right atrial size was mildly dilated.  4. The mitral valve is normal in structure.  5. The tricuspid valve is normal in structure.  6. The aortic valve is tricuspid.  7. The aortic root and ascending aorta are normal in size and structure.  8. Right atrial pressure is estimated at 8 mmHg.  9. The inferior vena cava was normal in size with <50% respiratory variability. 10. When compared to the prior study: Compared to a prior study in 04/2017, there are no significant changes.  Ct Angio Head W Or Wo Contrast  Result Date: 10/22/2018 CLINICAL DATA:  Acute LEFT occipital and subacute RIGHT posterior frontal infarcts, continued surveillance. EXAM: CT ANGIOGRAPHY HEAD AND NECK TECHNIQUE: Multidetector CT imaging of the head and neck was performed using the standard protocol during bolus administration of intravenous contrast. Multiplanar CT image reconstructions and MIPs were obtained to evaluate the vascular anatomy. Carotid stenosis measurements (when applicable) are obtained utilizing NASCET criteria, using the distal internal carotid diameter as the denominator. CONTRAST:  74mL ISOVUE-370 IOPAMIDOL (ISOVUE-370) INJECTION 76% COMPARISON:  MR brain earlier today. CT head yesterday. FINDINGS: Initial CTA was nondiagnostic. The patient had to return, with a larger caliber IV, for repeat imaging. CTA NECK FINDINGS Aortic arch: Standard branching. Imaged portion shows no evidence of aneurysm or dissection. No significant stenosis of the major arch vessel origins. Right carotid system: No evidence of dissection, stenosis (50% or greater) or occlusion. Minor atheromatous change proximal RIGHT ICA. Left carotid system: No evidence of dissection, stenosis (50%  or greater) or occlusion. Minor atheromatous change proximal LEFT ICA. Vertebral arteries: Codominant. No evidence of dissection, stenosis (50% or greater) or occlusion. Skeleton: Unremarkable. Other neck: Noncontributory. Upper chest: No mass or pneumothorax. Mediastinal lipomatosis. Review of the MIP images confirms the above findings CTA HEAD FINDINGS Anterior circulation: No significant proximal stenosis, proximal occlusion, aneurysm, or vascular malformation. Mild irregularity of the RIGHT greater than LEFT M2 and M3 branches bilaterally, as well as the distal anterior cerebral artery branches, suggesting early intracranial atherosclerotic disease. Posterior circulation: No significant stenosis, proximal occlusion, aneurysm, or vascular malformation. Venous sinuses: As permitted by contrast timing, patent. Anatomic variants: None of significance. Delayed phase: No abnormal postcontrast enhancement. Review of the MIP images confirms the above findings IMPRESSION: No extracranial stenosis of significance. No intracranial large vessel occlusion or proximal flow-limiting stenosis. Mild irregularity of the RIGHT greater than LEFT M2 and M3 MCA branches bilaterally, as well as distal ACA irregularity, suggesting intracranial atherosclerotic disease. Electronically Signed   By: Elsie Stain M.D.   On: 10/22/2018 14:33   Ct Head Wo Contrast  Result Date: 10/21/2018 CLINICAL DATA:  Severe headache, brief memory loss, elevated  blood pressure. EXAM: CT HEAD WITHOUT CONTRAST TECHNIQUE: Contiguous axial images were obtained from the base of the skull through the vertex without intravenous contrast. COMPARISON:  Head CT dated 05/11/2017 and brain MRI dated 05/12/2017. FINDINGS: Brain: Old infarct within the LEFT thalamus. No mass, hemorrhage, edema or other evidence of acute parenchymal abnormality. No extra-axial hemorrhage. Vascular: No hyperdense vessel or unexpected calcification. Skull: Normal. Negative for  fracture or focal lesion. Sinuses/Orbits: Fluid levels and mucosal thickening within the bilateral maxillary sinuses. Periorbital and retro-orbital soft tissues are unremarkable. Other: None. IMPRESSION: 1. No acute intracranial abnormality. No intracranial mass, hemorrhage or edema. 2. Old infarct within the LEFT thalamus. 3. Maxillary sinus disease, of uncertain chronicity. Electronically Signed   By: Bary RichardStan  Maynard M.D.   On: 10/21/2018 22:36   Ct Angio Neck W Or Wo Contrast  Result Date: 10/22/2018 CLINICAL DATA:  Acute LEFT occipital and subacute RIGHT posterior frontal infarcts, continued surveillance. EXAM: CT ANGIOGRAPHY HEAD AND NECK TECHNIQUE: Multidetector CT imaging of the head and neck was performed using the standard protocol during bolus administration of intravenous contrast. Multiplanar CT image reconstructions and MIPs were obtained to evaluate the vascular anatomy. Carotid stenosis measurements (when applicable) are obtained utilizing NASCET criteria, using the distal internal carotid diameter as the denominator. CONTRAST:  75mL ISOVUE-370 IOPAMIDOL (ISOVUE-370) INJECTION 76% COMPARISON:  MR brain earlier today. CT head yesterday. FINDINGS: Initial CTA was nondiagnostic. The patient had to return, with a larger caliber IV, for repeat imaging. CTA NECK FINDINGS Aortic arch: Standard branching. Imaged portion shows no evidence of aneurysm or dissection. No significant stenosis of the major arch vessel origins. Right carotid system: No evidence of dissection, stenosis (50% or greater) or occlusion. Minor atheromatous change proximal RIGHT ICA. Left carotid system: No evidence of dissection, stenosis (50% or greater) or occlusion. Minor atheromatous change proximal LEFT ICA. Vertebral arteries: Codominant. No evidence of dissection, stenosis (50% or greater) or occlusion. Skeleton: Unremarkable. Other neck: Noncontributory. Upper chest: No mass or pneumothorax. Mediastinal lipomatosis. Review of  the MIP images confirms the above findings CTA HEAD FINDINGS Anterior circulation: No significant proximal stenosis, proximal occlusion, aneurysm, or vascular malformation. Mild irregularity of the RIGHT greater than LEFT M2 and M3 branches bilaterally, as well as the distal anterior cerebral artery branches, suggesting early intracranial atherosclerotic disease. Posterior circulation: No significant stenosis, proximal occlusion, aneurysm, or vascular malformation. Venous sinuses: As permitted by contrast timing, patent. Anatomic variants: None of significance. Delayed phase: No abnormal postcontrast enhancement. Review of the MIP images confirms the above findings IMPRESSION: No extracranial stenosis of significance. No intracranial large vessel occlusion or proximal flow-limiting stenosis. Mild irregularity of the RIGHT greater than LEFT M2 and M3 MCA branches bilaterally, as well as distal ACA irregularity, suggesting intracranial atherosclerotic disease. Electronically Signed   By: Elsie StainJohn T Curnes M.D.   On: 10/22/2018 14:33   Mr Brain Wo Contrast  Result Date: 10/22/2018 CLINICAL DATA:  Initial evaluation for acute headache, generalized weakness, dizziness. EXAM: MRI HEAD WITHOUT CONTRAST TECHNIQUE: Multiplanar, multiecho pulse sequences of the brain and surrounding structures were obtained without intravenous contrast. COMPARISON:  Prior CT from 10/21/2018 FINDINGS: Brain: Generalized age-appropriate cerebral volume. Mild chronic micro vessel ischemic changes present within the periventricular and deep white matter both cerebral hemispheres. Few small remote lacunar infarcts present within the bilateral basal ganglia and thalami. 8 mm acute ischemic infarct present at the left occipital lobe, left PCA territory (series 5, image 64). No associated hemorrhage or mass effect. Probable additional early subacute  4 mm small vessel type infarcts seen involving the posterior right centrum semi ovale (series 5, image  84). No other evidence for acute or recent infarction. No encephalomalacia to suggest chronic cortical infarction. No foci of susceptibility artifact to suggest acute or chronic intracranial hemorrhage. No mass lesion, midline shift or mass effect. No hydrocephalus. No extra-axial fluid collection. Pituitary gland normal. Vascular: Major intracranial vascular flow voids maintained. Skull and upper cervical spine: Craniocervical junction normal. Upper cervical spine normal. Bone marrow signal intensity within normal limits. No scalp soft tissue abnormality. Sinuses/Orbits: Globes and orbital soft tissues within normal limits. Mucosal thickening with air-fluid levels present within the maxillary sinuses bilaterally, compatible with acute sinusitis. Small left mastoid effusion noted, of doubtful significance. Inner ear structures grossly normal. Other: None. IMPRESSION: 1. 8 mm acute ischemic nonhemorrhagic left occipital lobe infarct. 2. Additional 4 mm early subacute ischemic nonhemorrhagic small vessel type infarct involving the right centrum semi ovale. 3. Underlying mild chronic microvascular ischemic disease with scattered remote lacunar infarcts involving the bilateral basal ganglia and thalami. 4. Acute maxillary sinusitis. Electronically Signed   By: Rise Mu M.D.   On: 10/22/2018 02:06    Results/Tests Pending at Time of Discharge:  Unresulted Labs (From admission, onward)   None       Discharge Medications:  Allergies as of 10/24/2018   No Known Allergies     Medication List    STOP taking these medications   cloNIDine 0.2 MG tablet Commonly known as:  CATAPRES   LANTUS SOLOSTAR 100 UNIT/ML Solostar Pen Generic drug:  Insulin Glargine Replaced by:  Insulin Glargine (1 Unit Dial) 300 UNIT/ML Sopn     TAKE these medications   amLODipine 10 MG tablet Commonly known as:  NORVASC Take 1 tablet (10 mg total) by mouth daily.   aspirin 325 MG tablet Take 1 tablet (325 mg  total) by mouth daily.   atorvastatin 80 MG tablet Commonly known as:  LIPITOR Take 1 tablet (80 mg total) by mouth daily at 6 PM.   cholecalciferol 25 MCG (1000 UT) tablet Commonly known as:  VITAMIN D Take 1,000 Units by mouth daily.   clopidogrel 75 MG tablet Commonly known as:  PLAVIX Take 1 tablet (75 mg total) by mouth daily.   Insulin Glargine (1 Unit Dial) 300 UNIT/ML Sopn Commonly known as:  TOUJEO SOLOSTAR Inject 15 Units into the skin at bedtime. Replaces:  LANTUS SOLOSTAR 100 UNIT/ML Solostar Pen   lisinopril 40 MG tablet Commonly known as:  PRINIVIL,ZESTRIL Take 1 tablet (40 mg total) by mouth daily. What changed:    medication strength  how much to take  when to take this   metFORMIN 500 MG tablet Commonly known as:  GLUCOPHAGE Take 1 tablet (500 mg total) by mouth 2 (two) times daily with a meal. What changed:  when to take this       Discharge Instructions: Please refer to Patient Instructions section of EMR for full details.  Patient was counseled important signs and symptoms that should prompt return to medical care, changes in medications, dietary instructions, activity restrictions, and follow up appointments.   Follow-Up Appointments: Follow-up Information    [facility] Follow [facility].   Specialty:  Rehabilitation Why:  They will contact you for the first appointment Contact information: [address] [id number] mc [place] [address] [phone]          Garnette Gunner, MD 10/26/2018, 4:40 PM PGY-2, [facility]

## 2018-10-26 NOTE — Assessment & Plan Note (Signed)
Status post CVA.  Continue aspirin 325 mg daily, continue Plavix 75 mg daily.  Follow-up with neurology as scheduled.  No residual deficit.

## 2018-10-26 NOTE — Assessment & Plan Note (Signed)
Metformin 500 mg twice daily.  This is been switched to Trulicity.  Received teaching on how to use Trulicity.  Additional Trulicity order placed in pharmacy.  Trulicity 0.5 mg weekly.  Gave counseling on diet and exercise.  Offered patient visit in nutrition clinic, he will consider and let me know if he is able to get off work.  Follow-up A1c in 3 months.  At that visit can likely titrate up metformin and possibly Trulicity.

## 2018-10-26 NOTE — Assessment & Plan Note (Signed)
LDL 118, HDL 32.  Total cholesterol within normal limits.  Continue atorvastatin 80 mg for statin control.

## 2018-10-26 NOTE — Telephone Encounter (Signed)
Received fax from pharmacy, PA needed on trulicity.  Clinical questions submitted via Cover My Meds.  Approved Start Date:09/26/2018;Coverage End Date:10/25/2021.  Cover My Meds info: Key: [id number]   Baldwin Racicot, Maryjo Rochester, CMA

## 2018-10-29 ENCOUNTER — Other Ambulatory Visit: Payer: Self-pay

## 2018-10-29 NOTE — Patient Outreach (Signed)
[facility]) Care Management  10/29/2018  Thomas Burch [birth year] [id number]  EMMI: stroke red alert.  Referral date: 10/29/18 Referral reason: questions / problems with medications,  Filled new prescriptions: NO,  Been able to take every dose of medication: no Insurance: [place] health care Day # 1  Telephone call to patient regarding EMMI stroke red alert.  HIPAA verified with patient. Explained reason for call. Patient state initially he was having difficulty with getting his medication filled. He states this issue had to do with his insurance. Patient states he has now gotten all of his medications filled and takes them as prescribed. Patient states he is scheduled to see a registered dietician on 11/09/18 regarding his diabetes.  Patient states he is waiting for call from the neurology office for appointment. Patient states he is scheduled to see Dr. Primitivo Gauze on 10/26/18  Patient states he has transportation to his appointments.  Patient denies any new symptoms since his discharge from the hospital.  RNCM discussed signs/ symptoms of stroke. Advised patient that 911 should be called for stroke like symptoms. Patient verbalized understanding.   Advised patient that they would receive another automated EMMI-General discharge post discharge calls to assess how they are doing following recent hospitalization and will receive a call from a nurse if any of their responses were abnormal. Patient voiced understanding and was appreciative of follow up call. RNCM advised patient to notify MD of any changes in condition prior to scheduled appointment. RNCM verified patient aware of 911 services for urgent/ emergent needs.   PLAN: RNCm will close patient due to patient being assessed and having no further needs.   George Ina RN,BSN,CCM [facility] Telephonic  [phone]

## 2018-10-29 NOTE — Patient Outreach (Signed)
Patient triggered Red on EMMI Stroke Dashboard, notification sent to Davina Green, RN 

## 2018-11-05 ENCOUNTER — Other Ambulatory Visit: Payer: Self-pay

## 2018-11-05 NOTE — Patient Outreach (Signed)
[facility]) Care Management  11/05/2018  Thomas Burch 18-May-1986 [id number]    EMMI-STROKE RED ON EMMI ALERT Day # 9 Date: 11/03/2018 Red Alert Reason: " Lost interest in things they used to enjoy? Yes"   Outreach attempt # 1 to patient.No answer at present. RN CM left HIPAA compliant voicemail message along with contact info.        Plan: RN CM will make outreach attempt to patient within 3-4 business days. RN CM will send unsuccessful outreach letter to patient.  Antionette Fairy, RN,BSN,CCM [facility] Care Management Telephonic Care Management Coordinator Direct Phone: [phone] Toll Free: [phone] Fax: [phone]

## 2018-11-06 ENCOUNTER — Other Ambulatory Visit: Payer: Self-pay

## 2018-11-06 NOTE — Patient Outreach (Signed)
[facility]) Care Management  11/06/2018  DELTA ISING Aug 31, 1986 [id number]   EMMI-STROKE RED ON EMMI ALERT Day # 9 Date: 11/03/2018 Red Alert Reason: " Lost interest in things they used to enjoy? Yes"   Voicemail message received from patient. Return call placed to patient. No answer. RN CM left HIPAA compliant voicemail message along with contact info.    Plan: RN CM will make outreach attempt to patient within 3-4 business days.  Antionette Fairy, RN,BSN,CCM [facility] Care Management Telephonic Care Management Coordinator Direct Phone: [phone] Toll Free: [phone] Fax: [phone]

## 2018-11-06 NOTE — Patient Outreach (Signed)
[facility]) Care Management  11/06/2018  Thomas Burch 11-28-1985 [id number]   EMMI-STROKE RED ON EMMI ALERT Day #9 Date:11/03/2018 Red Alert Reason:" Lost interest in things they used to enjoy? Yes"   Incoming call from patient returning RN CM call. Patient voices he is doing well and denies any acute issues or concerns. RN CM reviewed and addressed red alert with patient. He voices error in response. He denies those feelings. He states that he is one of the fortunate ones as he has made a full recovery from stroke. Patient is back to work and back driving. He voices that he feels good. He has follow up appts in place. RN CM confirmed with patient that he has all his meds in the home and no issues or concerns regarding them. No further RN CM needs or concerns identified at this time. Patient appreciative of follow up call.   Plan: RN CM will close case as no further interventions needed at this time.   Antionette Fairy, RN,BSN,CCM [facility] Care Management Telephonic Care Management Coordinator Direct Phone: [phone] Toll Free: [phone] Fax: [phone]

## 2018-11-09 ENCOUNTER — Other Ambulatory Visit: Payer: Self-pay | Admitting: Family Medicine

## 2018-11-09 ENCOUNTER — Encounter: Payer: Commercial Managed Care - PPO | Attending: Family Medicine | Admitting: *Deleted

## 2018-11-09 DIAGNOSIS — E1165 Type 2 diabetes mellitus with hyperglycemia: Secondary | ICD-10-CM

## 2018-11-09 NOTE — Progress Notes (Signed)
Diabetes Self-Management Education  Visit Type: First/Initial  Appt. Start Time: 0940 Appt. End Time: 1100  11/09/2018  [name], identified by name and date of birth, is a 33 y.o. male with a diagnosis of Diabetes: Type 2. Patient works full time as Merchandiser, retail at [employer], various shifts but usually 7 AM -3:30. He has had two episodes of strokes recently and states he is ready to learn more how to improve his overall health. He states he was given a [employer] in the hospital and he wore it for about 2 weeks. He does not have a Rx for it, so not using anymore.  ASSESSMENT  There were no vitals taken for this visit. There is no height or weight on file to calculate BMI.  Diabetes Self-Management Education - 11/09/18 0946      Visit Information   Visit Type  First/Initial      Initial Visit   Diabetes Type  Type 2    Are you currently following a meal plan?  Yes    What type of meal plan do you follow?  Keto    Are you taking your medications as prescribed?  Yes    Date Diagnosed  2018-2019      Health Coping   How would you rate your overall health?  Good      Psychosocial Assessment   Patient Belief/Attitude about Diabetes  Motivated to manage diabetes    Self-care barriers  Other (comment);Lack of material resources   limited insurance coverage   Other persons present  Patient    Patient Concerns  Nutrition/Meal planning;Weight Control;Glycemic Control    Special Needs  None    Preferred Learning Style  No preference indicated    Learning Readiness  Change in progress    How often do you need to have someone help you when you read instructions, pamphlets, or other written materials from your doctor or pharmacy?  1 - Never    What is the last grade level you completed in school?  12      Pre-Education Assessment   Patient understands the diabetes disease and treatment process.  Needs Instruction    Patient understands incorporating nutritional  management into lifestyle.  Needs Instruction    Patient undertands incorporating physical activity into lifestyle.  Needs Instruction    Patient understands using medications safely.  Needs Instruction    Patient understands monitoring blood glucose, interpreting and using results  Needs Instruction    Patient understands prevention, detection, and treatment of acute complications.  Needs Instruction    Patient understands prevention, detection, and treatment of chronic complications.  Needs Instruction    Patient understands how to develop strategies to address psychosocial issues.  Needs Instruction    Patient understands how to develop strategies to promote health/change behavior.  Needs Instruction      Complications   Last HgB A1C per patient/outside source  11.4 %    How often do you check your blood sugar?  1-2 times/day   was on [place] for a couple of weeks,    Fasting Blood glucose range (mg/dL)  161-096    Postprandial Blood glucose range (mg/dL)  045-409    Number of hypoglycemic episodes per month  0    Have you had a dilated eye exam in the past 12 months?  No    Have you had a dental exam in the past 12 months?  No      Dietary Intake  Breakfast  fat free greek yogurt OR fresh fruit    Lunch  buys off site OR eats at work: hot meal with meat, occasionally with potato or vegetable    Snack (afternoon)  occasionally whatever is around: small bag of chips, dark chocolate bite size, nuts    Dinner  he cooks usually- lean meat, vegetables, infrequently starch choice    Snack (evening)  rarely has food after dinner    Beverage(s)  water, slightly sweetened ice tea, diet soda. Has stopped fruit juices lately      Exercise   Exercise Type  Light (walking / raking leaves)    How many days per week to you exercise?  5    How many minutes per day do you exercise?  30    Total minutes per week of exercise  150      Patient Education   Previous Diabetes Education  No    Disease  state   Factors that contribute to the development of diabetes;Definition of diabetes, type 1 and 2, and the diagnosis of diabetes    Nutrition management   Role of diet in the treatment of diabetes and the relationship between the three main macronutrients and blood glucose level;Food label reading, portion sizes and measuring food.;Carbohydrate counting;Reviewed blood glucose goals for pre and post meals and how to evaluate the patients' food intake on their blood glucose level.    Physical activity and exercise   Role of exercise on diabetes management, blood pressure control and cardiac health.;Helped patient identify appropriate exercises in relation to his/her diabetes, diabetes complications and other health issue.    Medications  Reviewed patients medication for diabetes, action, purpose, timing of dose and side effects.    Monitoring  Purpose and frequency of SMBG.;Identified appropriate SMBG and/or A1C goals.    Chronic complications  Relationship between chronic complications and blood glucose control    Psychosocial adjustment  Role of stress on diabetes      Individualized Goals (developed by patient)   Nutrition  Follow meal plan discussed    Physical Activity  Exercise 3-5 times per week    Medications  take my medication as prescribed    Monitoring   test blood glucose pre and post meals as discussed    Reducing Risk  Other (comment)   Get follow up appt. with PCP and see regularly     Post-Education Assessment   Patient understands the diabetes disease and treatment process.  Demonstrates understanding / competency    Patient understands incorporating nutritional management into lifestyle.  Demonstrates understanding / competency    Patient undertands incorporating physical activity into lifestyle.  Demonstrates understanding / competency    Patient understands using medications safely.  Demonstrates understanding / competency    Patient understands monitoring blood glucose,  interpreting and using results  Demonstrates understanding / competency    Patient understands prevention, detection, and treatment of acute complications.  Demonstrates understanding / competency    Patient understands prevention, detection, and treatment of chronic complications.  Demonstrates understanding / competency    Patient understands how to develop strategies to address psychosocial issues.  Demonstrates understanding / competency    Patient understands how to develop strategies to promote health/change behavior.  Demonstrates understanding / competency      Outcomes   Expected Outcomes  Demonstrated interest in learning. Expect positive outcomes    Future DMSE  PRN    Program Status  Completed       Individualized Plan  for Diabetes Self-Management Training:   Learning Objective:  Patient will have a greater understanding of diabetes self-management. Patient education plan is to attend individual and/or group sessions per assessed needs and concerns.   Plan:   Patient Instructions  Plan:  Aim for 3 Carb Choices per meal (45 grams) +/- 1 either way  Aim for 0-2 Carbs per snack if hungry  Include protein in moderation with your meals and snacks Consider reading food labels for Total Carbohydrate of foods Continue with your activity level by walking for 30 minutes daily as tolerated Consider checking BG at alternate times per day Ask MD for Rx for Freestyle Libre Continuous Glucose Monitor  Continue taking medication as directed by MD  Expected Outcomes:  Demonstrated interest in learning. Expect positive outcomes  Education material provided: Food label handouts, A1C conversion sheet, Meal plan card and Carbohydrate counting sheet, Diabetes Medication handout. Snack handout  If problems or questions, patient to contact team via:  Phone  Future DSME appointment: PRN

## 2018-11-09 NOTE — Patient Instructions (Signed)
Plan:  Aim for 3 Carb Choices per meal (45 grams) +/- 1 either way  Aim for 0-2 Carbs per snack if hungry  Include protein in moderation with your meals and snacks Consider reading food labels for Total Carbohydrate of foods Continue with your activity level by walking for 30 minutes daily as tolerated Consider checking BG at alternate times per day Ask MD for Rx for Freestyle Libre Continuous Glucose Monitor  Continue taking medication as directed by MD

## 2018-11-10 MED ORDER — LISINOPRIL 40 MG PO TABS: 40.0000 mg | ORAL_TABLET | Freq: Every day | ORAL | 2 refills | Status: DC

## 2018-11-10 MED ORDER — DULAGLUTIDE 0.75 MG/0.5ML ~~LOC~~ SOAJ: 0.7500 mg | SUBCUTANEOUS | 2 refills | Status: DC

## 2018-11-10 MED ORDER — METFORMIN HCL 500 MG PO TABS: 500.0000 mg | ORAL_TABLET | Freq: Two times a day (BID) | ORAL | 2 refills | Status: DC

## 2018-11-16 ENCOUNTER — Other Ambulatory Visit: Payer: Self-pay | Admitting: Family Medicine

## 2018-11-27 ENCOUNTER — Encounter: Payer: Self-pay | Admitting: Family Medicine

## 2018-11-29 IMAGING — MR MR HEAD W/O CM
9 of 10 series · 35 of 48 positions shown · non-contrast
Comparison: Head CT 3 days ago

CLINICAL DATA: Altered mental status.

EXAM:
MRI HEAD WITHOUT CONTRAST
TECHNIQUE: Multiplanar, multiecho pulse sequences of the brain and surrounding
structures were obtained without intravenous contrast.

[Series 3: DWI · axial · 3.0mm · 0.94mm/px · z∈[-105,+49]mm · 9 of 106 slices shown (1 of 2)]
[im 1/106]
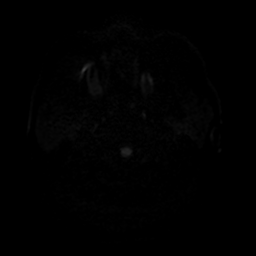
[im 14/106]
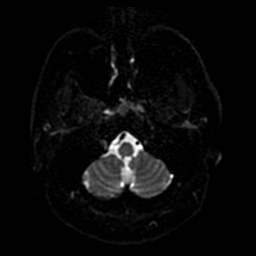
[im 27/106]
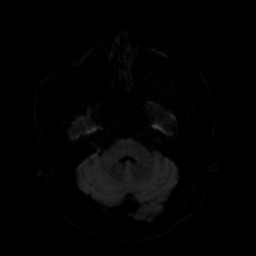
[im 40/106]
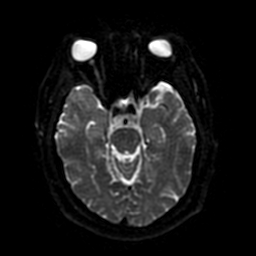
[im 53/106]
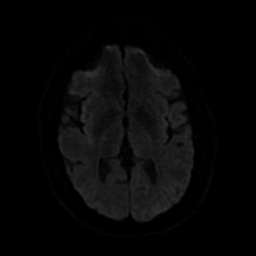
[im 66/106]
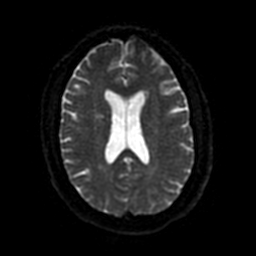
[im 79/106]
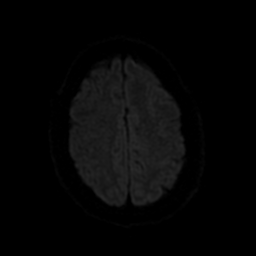
[im 92/106]
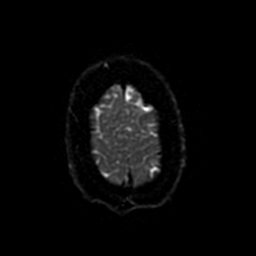
[im 106/106]
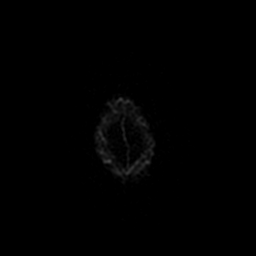

[Series 4: DWI · coronal · 4.0mm · 0.94mm/px · 6 of 72 slices shown (2 of 2)]
[im 1/72]
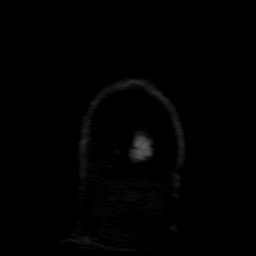
[im 15/72]
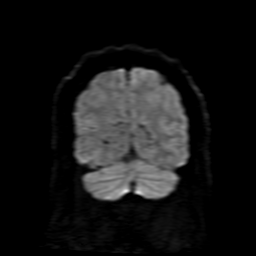
[im 29/72]
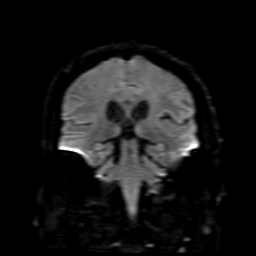
[im 43/72]
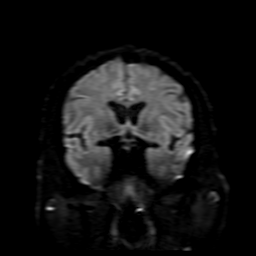
[im 57/72]
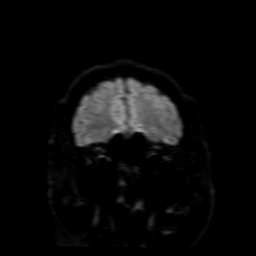
[im 72/72]
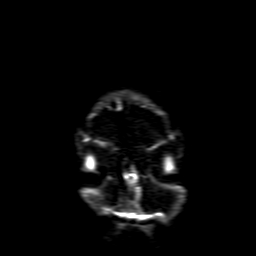

[Series 10: T2 · axial · 5.0mm · 0.47mm/px · z∈[-109,+57]mm · 3 of 29 slices shown (1 of 2)]
[im 1/29]
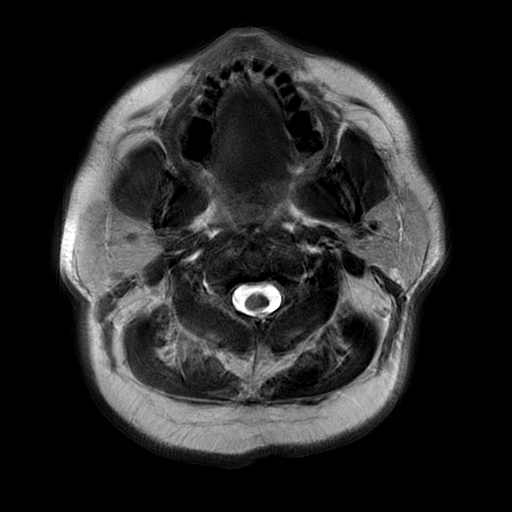
[im 15/29]
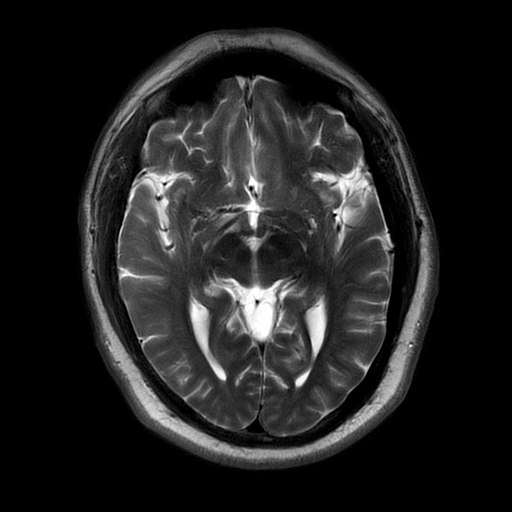
[im 29/29]
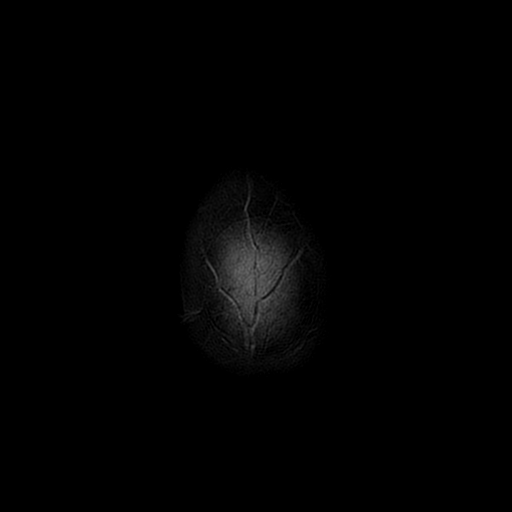

[Series 12: FLAIR · axial · 5.0mm · 0.47mm/px · z∈[-108,+58]mm · 3 of 29 slices shown (1 of 2)]
[im 1/29]
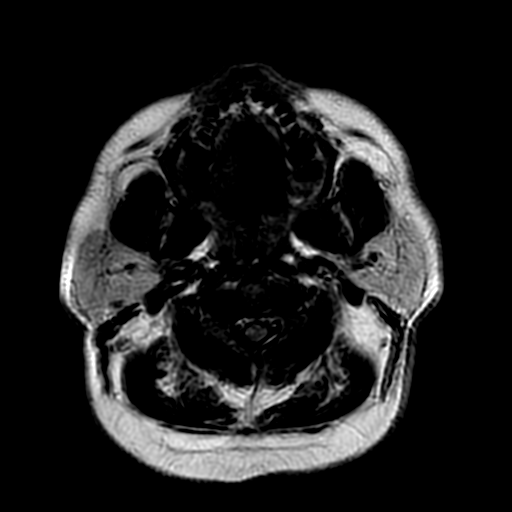
[im 15/29]
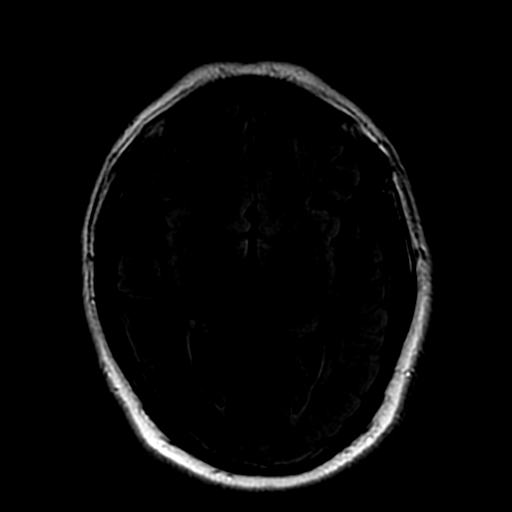
[im 29/29]
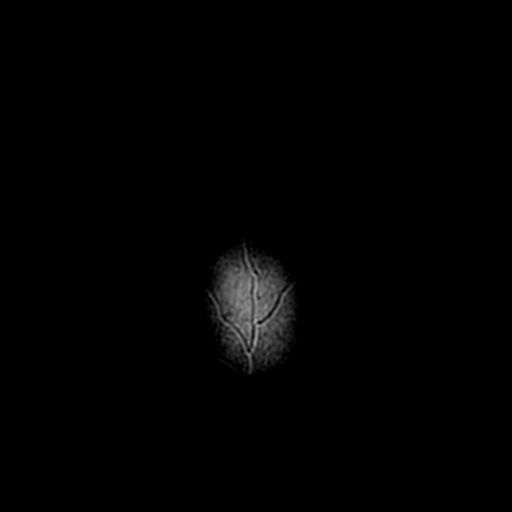

[Series 13: (person_name) · axial · 3.0mm · 0.47mm/px · 1 of 100 slices shown]
[im 1/100]
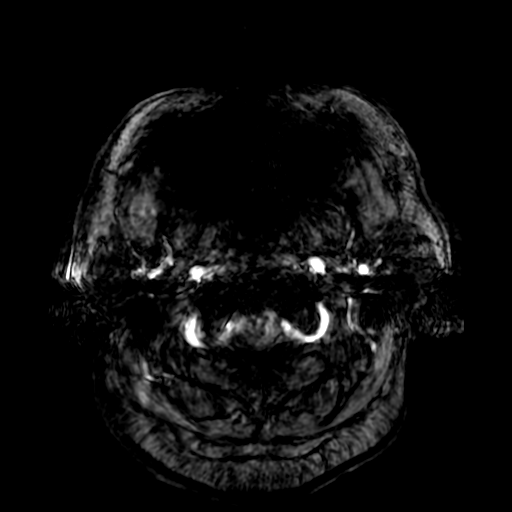

[Series 15: T2 · coronal · 5.0mm · 0.39mm/px · 3 of 30 slices shown (2 of 2)]
[im 1/30]
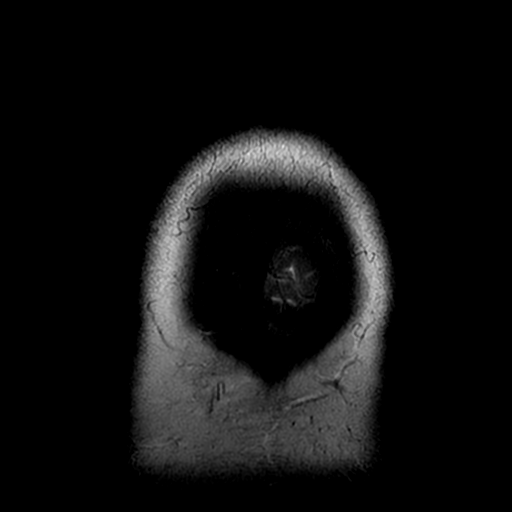
[im 15/30]
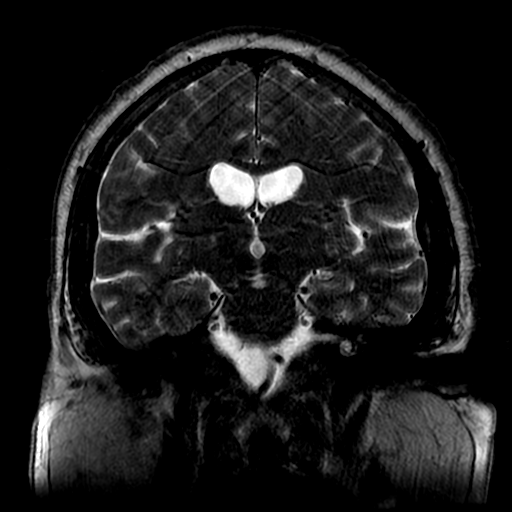
[im 30/30]
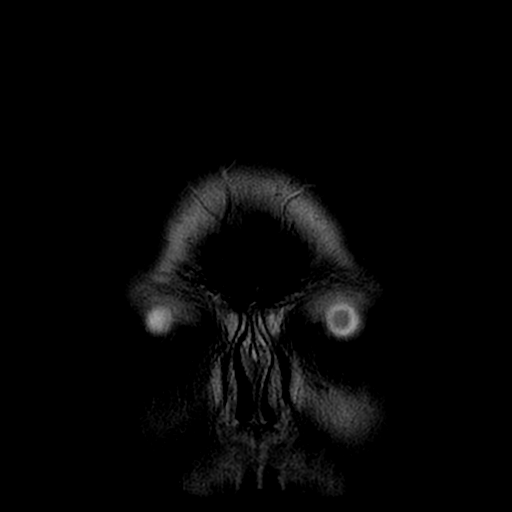

[Series 16: FLAIR · sagittal · 5.0mm · 0.47mm/px · 2 of 24 slices shown (2 of 2)]
[im 1/24]
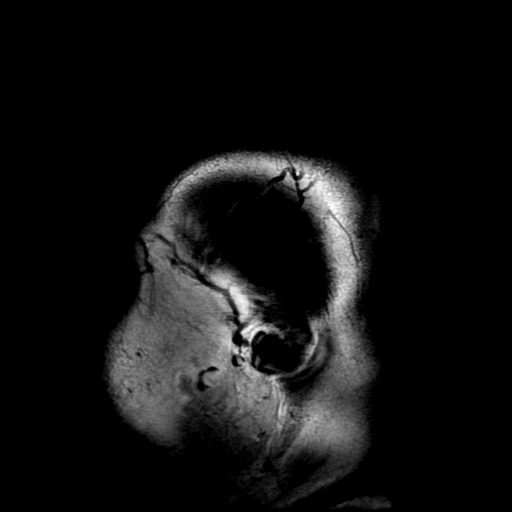
[im 24/24]
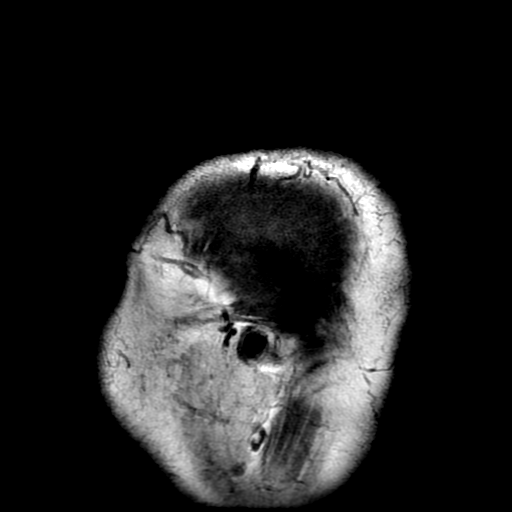

[Series 350: ADC · axial · 3.0mm · 0.94mm/px · z∈[-105,+49]mm · 5 of 53 slices shown (1 of 2)]
[im 1/53]
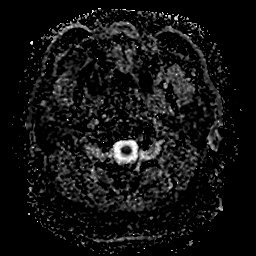
[im 14/53]
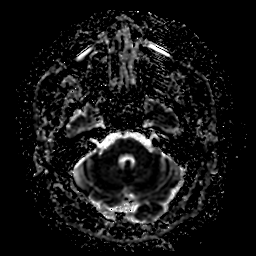
[im 27/53]
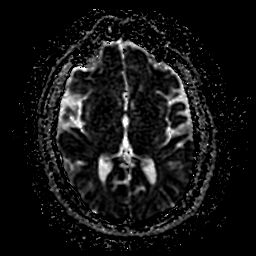
[im 40/53]
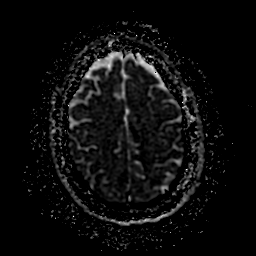
[im 53/53]
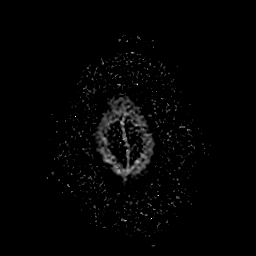

[Series 450: ADC · coronal · 4.0mm · 0.94mm/px · 3 of 36 slices shown (2 of 2)]
[im 1/36]
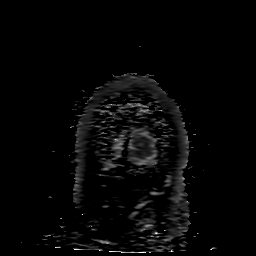
[im 18/36]
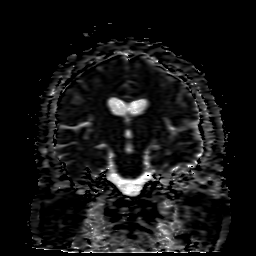
[im 36/36]
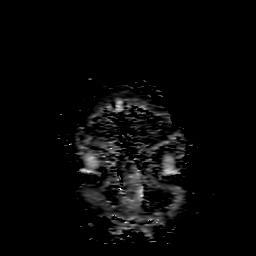

[35 of 48 positions shown; findings below may reference images not displayed]

FINDINGS: Brain: 6 mm acute cortical infarct in the parasagittal left
occipital cortex. Abnormal but less intense diffusion hyperintensity
in the deep right cerebral white matter (posterior corona radiata),
and cortex of the right occipital temporal lobe - these are in the
right MCA/ posterior border zone distribution. There is patchy FLAIR
hyperintensity in the cerebral white matter, mild but abnormal for
age. Patient's history of diabetes and hypertension and this is
likely premature chronic microvascular ischemia. No typical multiple
sclerosis type pattern. No hemorrhage, hydrocephalus, or masslike
findings.

Vascular: Major flow voids are preserved

Skull and upper cervical spine: Negative for marrow lesion

Sinuses/Orbits: Negative
IMPRESSION: 1. Subcentimeter acute infarct in the left occipital cortex. 2 even
smaller and likely subacute infarcts in the posterior right
cerebrum.
2. Mild but premature white matter signal abnormality, patient's
medical history suggesting chronic small vessel ischemia.

## 2018-12-06 ENCOUNTER — Encounter: Payer: Self-pay | Admitting: Family Medicine

## 2019-02-09 ENCOUNTER — Encounter: Payer: Self-pay | Admitting: Family Medicine

## 2019-02-11 NOTE — Telephone Encounter (Signed)
Will wait to hear back from pt on whether he would like a visit.   Jazmin Hartsell,CMA'

## 2019-03-06 ENCOUNTER — Encounter: Payer: Self-pay | Admitting: Family Medicine

## 2019-03-22 ENCOUNTER — Encounter: Payer: Self-pay | Admitting: Family Medicine

## 2019-03-26 ENCOUNTER — Other Ambulatory Visit: Payer: Self-pay | Admitting: Family Medicine

## 2019-03-26 DIAGNOSIS — Z20828 Contact with and (suspected) exposure to other viral communicable diseases: Secondary | ICD-10-CM

## 2019-03-26 DIAGNOSIS — Z20822 Contact with and (suspected) exposure to covid-19: Secondary | ICD-10-CM

## 2019-03-27 ENCOUNTER — Other Ambulatory Visit: Payer: Self-pay

## 2019-03-27 DIAGNOSIS — Z20822 Contact with and (suspected) exposure to covid-19: Secondary | ICD-10-CM

## 2019-03-31 LAB — NOVEL CORONAVIRUS, NAA: SARS-CoV-2, NAA: NOT DETECTED

## 2019-05-08 ENCOUNTER — Ambulatory Visit: Payer: Commercial Managed Care - PPO | Admitting: Emergency Medicine

## 2019-05-15 ENCOUNTER — Ambulatory Visit: Payer: Commercial Managed Care - PPO | Admitting: Emergency Medicine

## 2019-05-29 ENCOUNTER — Other Ambulatory Visit: Payer: Self-pay | Admitting: Family Medicine

## 2019-05-29 ENCOUNTER — Emergency Department (HOSPITAL_COMMUNITY): Payer: Commercial Managed Care - PPO

## 2019-05-29 ENCOUNTER — Inpatient Hospital Stay (HOSPITAL_COMMUNITY): Payer: Commercial Managed Care - PPO

## 2019-05-29 ENCOUNTER — Other Ambulatory Visit: Payer: Self-pay

## 2019-05-29 ENCOUNTER — Encounter (HOSPITAL_COMMUNITY): Payer: Self-pay | Admitting: Pharmacy Technician

## 2019-05-29 ENCOUNTER — Inpatient Hospital Stay (HOSPITAL_COMMUNITY)
Admission: EM | Admit: 2019-05-29 | Discharge: 2019-06-01 | DRG: 069 | Disposition: A | Discharge status: Home / Self Care | Payer: Commercial Managed Care - PPO | Attending: Family Medicine | Admitting: Family Medicine

## 2019-05-29 DIAGNOSIS — F419 Anxiety disorder, unspecified: Secondary | ICD-10-CM | POA: Diagnosis present

## 2019-05-29 DIAGNOSIS — Z794 Long term (current) use of insulin: Secondary | ICD-10-CM

## 2019-05-29 DIAGNOSIS — I340 Nonrheumatic mitral (valve) insufficiency: Secondary | ICD-10-CM | POA: Diagnosis not present

## 2019-05-29 DIAGNOSIS — E782 Mixed hyperlipidemia: Secondary | ICD-10-CM | POA: Diagnosis not present

## 2019-05-29 DIAGNOSIS — I6340 Cerebral infarction due to embolism of unspecified cerebral artery: Secondary | ICD-10-CM | POA: Diagnosis not present

## 2019-05-29 DIAGNOSIS — E1151 Type 2 diabetes mellitus with diabetic peripheral angiopathy without gangrene: Secondary | ICD-10-CM | POA: Diagnosis present

## 2019-05-29 DIAGNOSIS — Z8249 Family history of ischemic heart disease and other diseases of the circulatory system: Secondary | ICD-10-CM

## 2019-05-29 DIAGNOSIS — I639 Cerebral infarction, unspecified: Secondary | ICD-10-CM | POA: Diagnosis not present

## 2019-05-29 DIAGNOSIS — Z6835 Body mass index (BMI) 35.0-35.9, adult: Secondary | ICD-10-CM

## 2019-05-29 DIAGNOSIS — I10 Essential (primary) hypertension: Secondary | ICD-10-CM | POA: Diagnosis present

## 2019-05-29 DIAGNOSIS — Z20828 Contact with and (suspected) exposure to other viral communicable diseases: Secondary | ICD-10-CM | POA: Diagnosis present

## 2019-05-29 DIAGNOSIS — R4182 Altered mental status, unspecified: Secondary | ICD-10-CM | POA: Diagnosis present

## 2019-05-29 DIAGNOSIS — D72829 Elevated white blood cell count, unspecified: Secondary | ICD-10-CM | POA: Diagnosis present

## 2019-05-29 DIAGNOSIS — E785 Hyperlipidemia, unspecified: Secondary | ICD-10-CM | POA: Diagnosis present

## 2019-05-29 DIAGNOSIS — Z8673 Personal history of transient ischemic attack (TIA), and cerebral infarction without residual deficits: Secondary | ICD-10-CM | POA: Diagnosis not present

## 2019-05-29 DIAGNOSIS — Z7982 Long term (current) use of aspirin: Secondary | ICD-10-CM

## 2019-05-29 DIAGNOSIS — R29898 Other symptoms and signs involving the musculoskeletal system: Secondary | ICD-10-CM | POA: Diagnosis not present

## 2019-05-29 DIAGNOSIS — Z833 Family history of diabetes mellitus: Secondary | ICD-10-CM | POA: Diagnosis not present

## 2019-05-29 DIAGNOSIS — G4733 Obstructive sleep apnea (adult) (pediatric): Secondary | ICD-10-CM | POA: Diagnosis present

## 2019-05-29 DIAGNOSIS — G40909 Epilepsy, unspecified, not intractable, without status epilepticus: Secondary | ICD-10-CM | POA: Diagnosis present

## 2019-05-29 DIAGNOSIS — E1165 Type 2 diabetes mellitus with hyperglycemia: Secondary | ICD-10-CM | POA: Diagnosis present

## 2019-05-29 DIAGNOSIS — Z823 Family history of stroke: Secondary | ICD-10-CM | POA: Diagnosis not present

## 2019-05-29 DIAGNOSIS — R569 Unspecified convulsions: Secondary | ICD-10-CM | POA: Diagnosis not present

## 2019-05-29 DIAGNOSIS — Z7902 Long term (current) use of antithrombotics/antiplatelets: Secondary | ICD-10-CM

## 2019-05-29 DIAGNOSIS — I6782 Cerebral ischemia: Secondary | ICD-10-CM | POA: Diagnosis present

## 2019-05-29 DIAGNOSIS — Z79899 Other long term (current) drug therapy: Secondary | ICD-10-CM | POA: Diagnosis not present

## 2019-05-29 DIAGNOSIS — N179 Acute kidney failure, unspecified: Secondary | ICD-10-CM

## 2019-05-29 DIAGNOSIS — Z9114 Patient's other noncompliance with medication regimen: Secondary | ICD-10-CM | POA: Diagnosis not present

## 2019-05-29 DIAGNOSIS — F329 Major depressive disorder, single episode, unspecified: Secondary | ICD-10-CM | POA: Diagnosis present

## 2019-05-29 DIAGNOSIS — R404 Transient alteration of awareness: Secondary | ICD-10-CM | POA: Diagnosis not present

## 2019-05-29 DIAGNOSIS — D751 Secondary polycythemia: Secondary | ICD-10-CM | POA: Diagnosis present

## 2019-05-29 DIAGNOSIS — I69398 Other sequelae of cerebral infarction: Secondary | ICD-10-CM

## 2019-05-29 DIAGNOSIS — Z8349 Family history of other endocrine, nutritional and metabolic diseases: Secondary | ICD-10-CM | POA: Diagnosis not present

## 2019-05-29 DIAGNOSIS — E119 Type 2 diabetes mellitus without complications: Secondary | ICD-10-CM | POA: Diagnosis not present

## 2019-05-29 DIAGNOSIS — R809 Proteinuria, unspecified: Secondary | ICD-10-CM | POA: Diagnosis present

## 2019-05-29 DIAGNOSIS — R319 Hematuria, unspecified: Secondary | ICD-10-CM | POA: Diagnosis present

## 2019-05-29 LAB — RAPID URINE DRUG SCREEN, HOSP PERFORMED
Amphetamines: NOT DETECTED
Barbiturates: NOT DETECTED
Benzodiazepines: NOT DETECTED
Cocaine: NOT DETECTED
Opiates: NOT DETECTED
Tetrahydrocannabinol: NOT DETECTED

## 2019-05-29 LAB — TROPONIN I (HIGH SENSITIVITY)
Troponin I (High Sensitivity): 7 ng/L (ref <18)
Troponin I (High Sensitivity): 30 ng/L — ABNORMAL HIGH (ref <18)

## 2019-05-29 LAB — CBC WITH DIFFERENTIAL/PLATELET
Abs Immature Granulocytes: 0.12 10*3/uL — ABNORMAL HIGH (ref 0.00–0.07)
Basophils Absolute: 0.1 10*3/uL (ref 0.0–0.1)
Basophils Relative: 0 %
Eosinophils Absolute: 0 10*3/uL (ref 0.0–0.5)
Eosinophils Relative: 0 %
HCT: 54.6 % — ABNORMAL HIGH (ref 39.0–52.0)
Hemoglobin: 17.7 g/dL — ABNORMAL HIGH (ref 13.0–17.0)
Immature Granulocytes: 1 %
Lymphocytes Relative: 6 %
Lymphs Abs: 1.2 10*3/uL (ref 0.7–4.0)
MCH: 30.4 pg (ref 26.0–34.0)
MCHC: 32.4 g/dL (ref 30.0–36.0)
MCV: 93.7 fL (ref 80.0–100.0)
Monocytes Absolute: 0.8 10*3/uL (ref 0.1–1.0)
Monocytes Relative: 4 %
Neutro Abs: 18.2 10*3/uL — ABNORMAL HIGH (ref 1.7–7.7)
Neutrophils Relative %: 89 %
Platelets: 271 10*3/uL (ref 150–400)
RBC: 5.83 MIL/uL — ABNORMAL HIGH (ref 4.22–5.81)
RDW: 12.1 % (ref 11.5–15.5)
WBC: 20.3 10*3/uL — ABNORMAL HIGH (ref 4.0–10.5)
nRBC: 0 % (ref 0.0–0.2)

## 2019-05-29 LAB — CBC
HCT: 46.8 % (ref 39.0–52.0)
Hemoglobin: 16.1 g/dL (ref 13.0–17.0)
MCH: 30.3 pg (ref 26.0–34.0)
MCHC: 34.4 g/dL (ref 30.0–36.0)
MCV: 88 fL (ref 80.0–100.0)
Platelets: 302 10*3/uL (ref 150–400)
RBC: 5.32 MIL/uL (ref 4.22–5.81)
RDW: 12.1 % (ref 11.5–15.5)
WBC: 20.3 10*3/uL — ABNORMAL HIGH (ref 4.0–10.5)
nRBC: 0 % (ref 0.0–0.2)

## 2019-05-29 LAB — COMPREHENSIVE METABOLIC PANEL
ALT: 41 U/L (ref 0–44)
AST: 31 U/L (ref 15–41)
Albumin: 3.5 g/dL (ref 3.5–5.0)
Alkaline Phosphatase: 85 U/L (ref 38–126)
Anion gap: 22 — ABNORMAL HIGH (ref 5–15)
BUN: 11 mg/dL (ref 6–20)
CO2: 15 mmol/L — ABNORMAL LOW (ref 22–32)
Calcium: 8.9 mg/dL (ref 8.9–10.3)
Chloride: 97 mmol/L — ABNORMAL LOW (ref 98–111)
Creatinine, Ser: 1.48 mg/dL — ABNORMAL HIGH (ref 0.61–1.24)
GFR calc Af Amer: >60 mL/min (ref >60)
GFR calc non Af Amer: >60 mL/min (ref >60)
Glucose, Bld: 396 mg/dL — ABNORMAL HIGH (ref 70–99)
Potassium: 4.5 mmol/L (ref 3.5–5.1)
Sodium: 134 mmol/L — ABNORMAL LOW (ref 135–145)
Total Bilirubin: 1 mg/dL (ref 0.3–1.2)
Total Protein: 7.3 g/dL (ref 6.5–8.1)

## 2019-05-29 LAB — URINALYSIS, ROUTINE W REFLEX MICROSCOPIC
Bacteria, UA: NONE SEEN
Bilirubin Urine: NEGATIVE
Glucose, UA: >=500 mg/dL — AB
Ketones, ur: 5 mg/dL — AB
Leukocytes,Ua: NEGATIVE
Nitrite: NEGATIVE
Protein, ur: >=300 mg/dL — AB
Specific Gravity, Urine: 1.032 — ABNORMAL HIGH (ref 1.005–1.030)
pH: 5 (ref 5.0–8.0)

## 2019-05-29 LAB — SARS CORONAVIRUS 2 (TAT 6-24 HRS): SARS Coronavirus 2: NEGATIVE

## 2019-05-29 LAB — LACTIC ACID, PLASMA: Lactic Acid, Venous: 3.4 mmol/L (ref 0.5–1.9)

## 2019-05-29 LAB — CBG MONITORING, ED
Glucose-Capillary: 370 mg/dL — ABNORMAL HIGH (ref 70–99)
Glucose-Capillary: 310 mg/dL — ABNORMAL HIGH (ref 70–99)

## 2019-05-29 LAB — PROTIME-INR
INR: 1 (ref 0.8–1.2)
Prothrombin Time: 12.9 seconds (ref 11.4–15.2)

## 2019-05-29 LAB — ETHANOL: Alcohol, Ethyl (B): <10 mg/dL (ref <10)

## 2019-05-29 LAB — CREATININE, SERUM
Creatinine, Ser: 1.08 mg/dL (ref 0.61–1.24)
GFR calc Af Amer: >60 mL/min (ref >60)
GFR calc non Af Amer: >60 mL/min (ref >60)

## 2019-05-29 MED ORDER — INSULIN GLARGINE 100 UNIT/ML ~~LOC~~ SOLN
15.0000 [IU] | Freq: Every day | SUBCUTANEOUS | Status: DC
  Administered 2019-05-29 – 2019-05-30 (×2): 15 [IU] via SUBCUTANEOUS
  Filled 2019-05-29 (×3): qty 0.15

## 2019-05-29 MED ORDER — HYDROXYZINE HCL 10 MG PO TABS
10.0000 mg | ORAL_TABLET | Freq: Two times a day (BID) | ORAL | Status: DC | PRN
  Filled 2019-05-29: qty 1

## 2019-05-29 MED ORDER — AMLODIPINE BESYLATE 5 MG PO TABS: 10.0000 mg | ORAL_TABLET | Freq: Every day | ORAL | Status: DC

## 2019-05-29 MED ORDER — LISINOPRIL 20 MG PO TABS: 40.0000 mg | ORAL_TABLET | Freq: Every day | ORAL | Status: DC

## 2019-05-29 MED ORDER — ASPIRIN 325 MG PO TABS
325.0000 mg | ORAL_TABLET | Freq: Every day | ORAL | Status: DC
  Administered 2019-05-29 – 2019-06-01 (×4): 325 mg via ORAL
  Filled 2019-05-29 (×4): qty 1

## 2019-05-29 MED ORDER — ADULT MULTIVITAMIN W/MINERALS CH
1.0000 | ORAL_TABLET | Freq: Every day | ORAL | Status: DC
  Administered 2019-05-29 – 2019-06-01 (×4): 1 via ORAL
  Filled 2019-05-29 (×4): qty 1

## 2019-05-29 MED ORDER — ATORVASTATIN CALCIUM 80 MG PO TABS
80.0000 mg | ORAL_TABLET | Freq: Every day | ORAL | Status: DC
  Administered 2019-05-30 – 2019-05-31 (×2): 80 mg via ORAL
  Filled 2019-05-29 (×2): qty 1

## 2019-05-29 MED ORDER — INSULIN ASPART 100 UNIT/ML ~~LOC~~ SOLN
0.0000 [IU] | Freq: Three times a day (TID) | SUBCUTANEOUS | Status: DC
  Administered 2019-05-30: 11 [IU] via SUBCUTANEOUS
  Administered 2019-05-30: 3 [IU] via SUBCUTANEOUS
  Administered 2019-05-30 – 2019-05-31 (×2): 5 [IU] via SUBCUTANEOUS
  Administered 2019-05-31: 3 [IU] via SUBCUTANEOUS
  Administered 2019-05-31: 5 [IU] via SUBCUTANEOUS
  Administered 2019-06-01: 3 [IU] via SUBCUTANEOUS

## 2019-05-29 MED ORDER — ENOXAPARIN SODIUM 40 MG/0.4ML ~~LOC~~ SOLN
40.0000 mg | SUBCUTANEOUS | Status: DC
  Administered 2019-05-29 – 2019-05-31 (×3): 40 mg via SUBCUTANEOUS
  Filled 2019-05-29 (×4): qty 0.4

## 2019-05-29 MED ORDER — ACETAMINOPHEN 325 MG PO TABS: 650.0000 mg | ORAL_TABLET | ORAL | Status: DC | PRN

## 2019-05-29 MED ORDER — VITAMIN D-3 25 MCG (1000 UT) PO CAPS: 1000.0000 [IU] | ORAL_CAPSULE | Freq: Every day | ORAL | Status: DC

## 2019-05-29 MED ORDER — ACETAMINOPHEN 325 MG PO TABS
650.0000 mg | ORAL_TABLET | Freq: Once | ORAL | Status: AC
  Administered 2019-05-29: 650 mg via ORAL
  Filled 2019-05-29: qty 2

## 2019-05-29 MED ORDER — SODIUM CHLORIDE 0.9 % IV BOLUS
500.0000 mL | Freq: Once | INTRAVENOUS | Status: AC
  Administered 2019-05-29: 500 mL via INTRAVENOUS

## 2019-05-29 MED ORDER — SODIUM CHLORIDE 0.9 % IV SOLN
75.0000 mL/h | INTRAVENOUS | Status: DC
  Administered 2019-05-29: 75 mL/h via INTRAVENOUS

## 2019-05-29 MED ORDER — SODIUM CHLORIDE 0.9 % IV BOLUS
1000.0000 mL | Freq: Once | INTRAVENOUS | Status: AC
  Administered 2019-05-29: 1000 mL via INTRAVENOUS

## 2019-05-29 MED ORDER — ESCITALOPRAM OXALATE 10 MG PO TABS
10.0000 mg | ORAL_TABLET | Freq: Every day | ORAL | Status: DC
  Administered 2019-05-31: 10 mg via ORAL
  Filled 2019-05-29 (×3): qty 1

## 2019-05-29 MED ORDER — LABETALOL HCL 5 MG/ML IV SOLN
5.0000 mg | INTRAVENOUS | Status: DC | PRN
  Administered 2019-05-30 – 2019-06-01 (×4): 5 mg via INTRAVENOUS
  Filled 2019-05-29 (×4): qty 4

## 2019-05-29 MED ORDER — INSULIN GLARGINE (1 UNIT DIAL) 300 UNIT/ML ~~LOC~~ SOPN: 15.0000 [IU] | PEN_INJECTOR | Freq: Every day | SUBCUTANEOUS | Status: DC

## 2019-05-29 MED ORDER — VITAMIN D 25 MCG (1000 UNIT) PO TABS
1000.0000 [IU] | ORAL_TABLET | Freq: Every day | ORAL | Status: DC
  Administered 2019-05-30 – 2019-06-01 (×4): 1000 [IU] via ORAL
  Filled 2019-05-29 (×4): qty 1

## 2019-05-29 MED ORDER — ACETAMINOPHEN 650 MG RE SUPP: 650.0000 mg | RECTAL | Status: DC | PRN

## 2019-05-29 NOTE — Progress Notes (Signed)
EEG reviewed by Dr. Hortense Ramal. Right sided slowing. No seizures Await MRI and repeat exam in AM before making decision on starting AED.   -- Amie Portland, MD [facility] Pager: [phone] If 7pm to 7am, please call on call as listed on AMION.

## 2019-05-29 NOTE — H&P (Addendum)
Family Medicine Teaching [facility] Admission History and Physical Service Pager: [phone]  Patient name: Thomas Burch Medical record number: [id number] Date of birth: 04-12-1986 Age: 33 y.o. Gender: male  Primary Care Provider: Shirley, [place], DO Consultants: Neurology Code Status: Full  Chief Complaint: AMS  Assessment and Plan: Thomas Burch is a 33 y.o. male presenting with AMS . PMH is significant for a past medical history of multiple CVAs (most recent in 10/2018), DMT2, HTN, HLD, OSA  #Altered mental status secondary to TIA versus seizure Patient presented with transient AMS.  However this had resolved on admission.  He had no neurological deficits on admission.  CT head without contrast not show any evidence of acute stroke, showed prior evidence of microvascular disease.  Patient has significant history of CVAs and was fairly hypertensive on admission with systolic BPs in the 180s, raising concern for TIA, with CVA much less likely.  However, seizure seems to be slightly more likely given negative head CT and the fact that it took several hours for patient to fully arouse and return to neurologic baseline.  There is also this episode of right arm contracture which may be due to the seizure.  Patient's history of CVAs could be the nidus of seizure.  Labs are also consistent with possible stress reaction with elevated WBC and elevated lactic acid which would be consistent with possible seizure as patient really has no infectious symptoms otherwise.  He is also afebrile.  Patient also presented with small tongue laceration which could have happened during the seizure as well.   Admit to FPTS, attending Dr. Pollie Meyer, cardiac telemetry  Neurology consulted in ED, recommendations appreciated  Seizure precautions  Neuro checks  Permissive hypertension with goal less than systolic 210, diastolic less than 120, until cleared by neurology  IV labetalol to maintain blood pressures  per goal above  EEG  MRI Brain  PT/OT  Stratification labs: Hemoglobin A1c and lipid panel  Trend lactic acid  Continue ASA  Tylenol PRN headache  #AKI Baseline creatinine around 1.  Patient presents with creatinine of 1.4.  Status post 1.5 L normal saline bolus.  Patient may be mildly dehydrated.  Avoid nephro toxic agents  Repeat creatinine in a.m.  #Leukocytosis with neutrophil predominance Possible stress reaction due to unprovoked seizure.  Patient is afebrile.  No history of infectious symptoms.  Monitor CBC in a.m.  #Polycythemia Mildly elevated at 17.7.  Possibly due to dehydration.  Does not smoke.  Will monitor with CBC.  #Elevated troponin Initial high-sensitivity troponin was negative at 7.  Second troponin was mildly elevated at 30.  EKG showed sinus tachycardia but did not show any other acute ST abnormalities.  Patient does not have any chest pain or shortness of breath.  Trend troponins  Cardiac telemetry  #Hematuria  calcium oxalate crystals in urine  proteinuria UA obtained in the ED.  Patient did not have any symptoms of dysuria or urinary pain.  Calcium oxalate crystals may show evidence of a small stone that patient may be passing causing hematuria.  Patient's proteinuria may be from renal damage from uncontrolled diabetes.  Patient may need ACE inhibitor/arm due to proteinuria  Recommend repeating UA in the outpatient setting  Recommend getting outpatient microalbumin/creatinine ratio when acute abnormalities on UA and AKI resolved to look for evidence of proteinuria  #Diabetes type 2, uncontrolled Patient takes metformin 500 mg twice daily, Toujeo 15 units nightly.  Last A1c was 11.4 on 10/2018.  Presenting with CBG  near 400 on mission.  Hold metformin  Continue insulin glargine 15 units nightly,  Moderate sliding scale insulin with 3 times daily CBG checks/AC  #Prior CVAs Patient had multiple strokes 2018 and most recently in 10/2018.   CT head without contrast showed no acute stroke, however showed chronic micro vessel disease, bilateral chronic thalamic lacunar infarcts.  Allowing for permissive hypertension, until cleared by neurology  Neurology consulted in ED, appreciate recommendations  #Hypertension Hypertensive on admission to systolics in the 180s.Takes amlodipine 10, lisinopril 40 at home  Permissive hypertension until cleared by neurology  IV labetalol as above  Hold amlodipine and lisinopril  #Hyperlipidemia On atorvastatin 80 mg.  Continue atorvastatin  Repeat lipid panel  #OSA  CPAP nightly  #Depression and anxiety On home escitalopram 10 mg and Atarax 10 mg twice daily as needed  Continue home medication  FEN/GI: Heart healthy diet/carb modified Prophylaxis: Lovenox VTE prophylaxis  Disposition: Inpatient admission  History of Present Illness:  Thomas Burch is a 33 y.o. male presenting with Patient was at home babysitting friends 472 year old son.  All patient remembers is that he lay down because he had a headache and was feeling tired and he woke when EMS got to him.  Per ED note and after discussing with patient's friend who was at bedside, patient was unresponsive to the 33 year old who then contacted an adult who eventually contacted [phone].  There is also reports that the patient during this episode complained of right arm pain and had contracture of the right arm.  Patient does not remember this.  When patient arrived to the ED, he was was still mildly confused.  When I went to go see them, the patient felt that he was back to normal.  He was alert and oriented x4 with no neurological deficits.  He was just complaining of some mild headache.  He had Thomas Burch received 1 dose of Tylenol and said that this improved it.  Patient has been altered before in the past, however so that this is may be slightly different.  The ED consulted neurology and said that they would be willing to see the patient,  however they were very busy and would take little while.  CT head without contrast showed no acute intracranial abnormalities.  Patient received 1.5 L normal saline bolus in the ED.  Review Of Systems: Per HPI with the following additions:   Review of Systems  Constitutional: Negative for chills and fever.  HENT: Negative for congestion.   Eyes: Negative for blurred vision and pain.  Respiratory: Negative for cough, sputum production and shortness of breath.   Cardiovascular: Negative for chest pain.  Gastrointestinal: Negative for abdominal pain, constipation, diarrhea, nausea and vomiting.  Genitourinary: Negative for dysuria.  Musculoskeletal: Negative.   Skin: Positive for rash (Chronic, has not changed, bilateral arms and lower extremities).  Neurological: Positive for headaches. Negative for dizziness.  Psychiatric/Behavioral: Negative.   All other systems reviewed and are negative.   Patient Active Problem List   Diagnosis Date Noted  . AMS (altered mental status) 05/29/2019  . Hospital discharge follow-up 10/26/2018  . Aphasia   . Snoring 12/04/2017  . Expressive aphasia 05/12/2017  . H/O cerebral artery stenosis 05/12/2017  . Diabetes mellitus type II, uncontrolled (HCC), with obesity   . Hyperlipidemia   . CVA (cerebral vascular accident) (HCC) 05/02/2017  . Essential hypertension 02/01/2014  . Morbid obesity (HCC) 02/01/2014    Past Medical History: Past Medical History:  Diagnosis Date  .  Cerebrovascular accident (CVA) due to thrombosis of cerebral artery ([place]) 05/11/2017  . Diabetes mellitus type 2 with complications ([place])   . Dysphasia 05/12/2017  . Headache   . Hypertension   . Stroke ([place])    Infarct of left cingulate gyrus    Past Surgical History: Past Surgical History:  Procedure Laterality Date  . TEE WITHOUT CARDIOVERSION N/A 05/04/2017   Procedure: TRANSESOPHAGEAL ECHOCARDIOGRAM (TEE);  Surgeon: Acie Fredrickson Wonda Cheng, MD;  Location[facility] ENDOSCOPY;   Service: Cardiovascular;  Laterality: N/A;    Social History: Social History   Tobacco Use  . Smoking status: Never Smoker  . Smokeless tobacco: Never Used  Substance Use Topics  . Alcohol use: No    Comment: 1 drink per week  . Drug use: No   Additional social history: Denies smoking, drinking alcohol, drugs Please also refer to relevant sections of EMR.  Family History: Family History  Problem Relation Age of Onset  . Hypertension Father   . Stroke Father   . Hyperlipidemia Father   . Diabetes Maternal Grandmother   . Clotting disorder Neg Hx      Allergies and Medications: No Known Allergies No current facility-administered medications on file prior to encounter.    Current Outpatient Medications on File Prior to Encounter  Medication Sig Dispense Refill  . amLODipine (NORVASC) 10 MG tablet Take 1 tablet (10 mg total) by mouth daily. 90 tablet 3  . aspirin 325 MG tablet Take 1 tablet (325 mg total) by mouth daily. 30 tablet 0  . Cholecalciferol (VITAMIN D-3) 25 MCG (1000 UT) CAPS Take 1,000 Units by mouth daily.    Marland Kitchen escitalopram (LEXAPRO) 10 MG tablet Take 10 mg by mouth at bedtime.     . hydrOXYzine (ATARAX/VISTARIL) 10 MG tablet Take 10 mg by mouth 2 (two) times daily as needed for anxiety.     . Insulin Glargine, 1 Unit Dial, (TOUJEO SOLOSTAR) 300 UNIT/ML SOPN Inject 15 Units into the skin at bedtime. 300 mL 5  . PRESCRIPTION MEDICATION See admin instructions. CPAP- At bedtime    . atorvastatin (LIPITOR) 80 MG tablet Take 1 tablet (80 mg total) by mouth daily at 6 PM. (Patient not taking: Reported on 05/29/2019) 90 tablet 1  . clopidogrel (PLAVIX) 75 MG tablet Take 1 tablet (75 mg total) by mouth daily. (Patient not taking: Reported on 05/29/2019) 90 tablet 3  . Dulaglutide 0.75 MG/0.5ML SOPN INJECT 0.75 MG INTO THE SKIN ONCE A WEEK. (Patient not taking: Reported on 05/29/2019) 3 pen 3  . lisinopril (PRINIVIL,ZESTRIL) 40 MG tablet Take 1 tablet (40 mg total) by mouth  daily. (Patient not taking: Reported on 05/29/2019) 30 tablet 2  . metFORMIN (GLUCOPHAGE) 500 MG tablet Take 1 tablet (500 mg total) by mouth 2 (two) times daily with a meal. (Patient not taking: Reported on 05/29/2019) 60 tablet 2    Objective: BP (!) 181/125   Pulse (!) 110   Temp 99.5 F (37.5 C) (Oral)   Resp 20   SpO2 97%  Exam: General: No acute distress Eyes: Anicteric, PERRLA, EOMI ENTM: MMM, small laceration on tongue Neck: Supple Cardiovascular: RRR, no MRG Respiratory: CTAB, NWOB Gastrointestinal: Soft, nontender nondistended MSK: Moves all extremities, no lower extremity edema Derm: Erythematous macules bilaterally distributed on upper and lower extremities, chronic, unchanged per patient Neuro: CN II through XII grossly intact, Psych: Normal affect and thought content  Labs and Imaging: CBC BMET  Recent Labs  Lab 05/29/19 1557  WBC 20.3*  HGB 17.7*  HCT 54.6*  PLT 271   Recent Labs  Lab 05/29/19 1557  NA 134*  K 4.5  CL 97*  CO2 15*  BUN 11  CREATININE 1.48*  GLUCOSE 396*  CALCIUM 8.9     EtOH: Less than 10  LA: 3.4  Troponin: 7 > 30  UA: Greater than 500 glucose, large hemoglobin, 5 ketones, greater than 300 protein, no bacteria negative leukocyte esterase, negative nitrites  UDS: Negative  EKG: Sinus tachycardia, old anterior lateral infarct  Ct Head Wo Contrast  Result Date: 05/29/2019 CLINICAL DATA:  Altered level consciousness (LOC), unexplained. EXAM: CT HEAD WITHOUT CONTRAST TECHNIQUE: Contiguous axial images were obtained from the base of the skull through the vertex without intravenous contrast. COMPARISON:  CT angiogram head/neck 10/22/2018, brain MRI 10/22/2018 FINDINGS: Brain: No evidence of acute intracranial hemorrhage. No demarcated cortical infarction. Redemonstrated chronic lacunar infarcts within the bilateral thalami. Additional known chronic infarcts within bilateral basal ganglia were better appreciated on brain MRI  10/22/2018. No evidence of intracranial mass. No midline shift or extra-axial fluid collection. Minimal patchy hypodensity within the cerebral white matter is nonspecific, but consistent chronic small vessel ischemic disease. Cerebral volume is normal for age. Partially empty sella turcica. Vascular: No definite hyperdense vessel. Skull: No calvarial fracture. Sinuses/Orbits: Small inferior right maxillary sinus mucous retention cyst partially imaged. Mild scattered ethmoid sinus mucosal thickening. No significant effusion. IMPRESSION: No CT evidence of acute intracranial abnormality. Mild chronic small vessel ischemic disease. Redemonstrated chronic bilateral thalamic lacunar infarcts. Additional known chronic lacunar infarcts within the basal ganglia were better appreciated on brain MRI 10/22/2018. Right maxillary sinus mucous retention cyst. Electronically Signed   By: Jackey Loge   On: 05/29/2019 18:24   Dg Chest Port 1 View  Result Date: 05/29/2019 CLINICAL DATA:  Pt states he laid down to take a nap because he didn't feel good and was woken up by EMS. Pt states he just started taking an antidepressant and thinks that may have caused this episode. No heart or lung diseases. HTN and diabetes. Nonsmoker. EXAM: PORTABLE CHEST 1 VIEW COMPARISON:  Chest radiograph 04/29/2017 FINDINGS: Stable cardiomediastinal contours which are within normal limits. The lungs are clear. No pneumothorax or pleural effusion. No acute finding in the visualized skeleton. IMPRESSION: No evidence of active disease. Electronically Signed   By: Emmaline Kluver M.D.   On: 05/29/2019 16:36    Garnette Gunner, MD 05/29/2019, 8:31 PM PGY-3, [facility] FPTS Intern pager: [phone], text pages welcome

## 2019-05-29 NOTE — Progress Notes (Signed)
EEG complete - results pending 

## 2019-05-29 NOTE — Consult Note (Signed)
Neurology Consultation  Reason for Consult: Altered mental status, concern for seizure Referring Physician: Dr. Rodena Medin  CC: Episode of unconsciousness, history of strokes  History is obtained from: Patient, chart  HPI: Thomas Burch is a 33 y.o. male past medical history of diabetes with complications, hypertension, stroke 2 years ago with no residual deficits but unclear etiology-most likely secondary to uncontrolled hypertension and diabetes with most of the stroke risk factor work-up in young negative or unremarkable, presents to the emergency room for evaluation of an episode of unresponsiveness. According the patient, he was babysitting a friend's child when all of a sudden the next thing he knew was that EMTs were called to evaluate him because he had lost consciousness. Does not report any seizure activity. Says he started a new antidepressant and took the first dose within the last 24 hours and thinks that that might have caused his current presentation. Reports some left-sided numbness on the arm but not on the face.  No facial droop.  No headache.  No visual changes. Neurology consultation was obtained because of unusual presentation of loss of consciousness as well as history of stroke in this young patient.  LKW: Unclear-maybe 3 PM today tpa given?: no, outside the window and minimal residual deficits-only sensory Premorbid modified Rankin scale (mRS): 0  ROS: ROS was performed and is negative except as noted in the HPI.  Past Medical History:  Diagnosis Date  . 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 Diabetes mellitus type 2 with complications (HCC)   . Dysphasia 05/12/2017  . Headache   . Hypertension   . Stroke (HCC)    Infarct of left cingulate gyrus    Family History  Problem Relation Age of Onset  . Hypertension Father   . Stroke Father   . Hyperlipidemia Father   . Diabetes Maternal Grandmother   . Clotting  disorder Neg Hx     Social History:   reports that he has never smoked. He has never used smokeless tobacco. He reports that he does not drink alcohol or use drugs.  Medications  Current Facility-Administered Medications:  .  0.9 %  sodium chloride infusion, 75 mL/hr, Intravenous, Continuous, Garnette Gunner, MD .  acetaminophen (TYLENOL) tablet 650 mg, 650 mg, Oral, Q4H PRN **OR** acetaminophen (TYLENOL) suppository 650 mg, 650 mg, Rectal, Q4H PRN, Garnette Gunner, MD .  amLODipine (NORVASC) tablet 10 mg, 10 mg, Oral, Daily, Garnette Gunner, MD .  aspirin tablet 325 mg, 325 mg, Oral, Daily, Garnette Gunner, MD .  Melene Muller ON 05/30/2019] atorvastatin (LIPITOR) tablet 80 mg, 80 mg, Oral, q1800, Garnette Gunner, MD .  enoxaparin (LOVENOX) injection 40 mg, 40 mg, Subcutaneous, Q24H, Garnette Gunner, MD .  escitalopram (LEXAPRO) tablet 10 mg, 10 mg, Oral, QHS, Garnette Gunner, MD .  hydrOXYzine (ATARAX/VISTARIL) tablet 10 mg, 10 mg, Oral, BID PRN, Garnette Gunner, MD .  Melene Muller ON 05/30/2019] insulin aspart (novoLOG) injection 0-15 Units, 0-15 Units, Subcutaneous, TID WC, Garnette Gunner, MD .  Insulin Glargine (1 Unit Dial) SOPN 15 Units, 15 Units, Subcutaneous, QHS, Garnette Gunner, MD .  lisinopril (ZESTRIL) tablet 40 mg, 40 mg, Oral, Daily, Garnette Gunner, MD .  multivitamin with minerals tablet 1 tablet, 1 tablet, Oral, Daily, Garnette Gunner, MD .  Vitamin D-3 CAPS 1,000 Units, 1,000 Units, Oral, Daily, Garnette Gunner, MD  Current Outpatient Medications:  .  amLODipine (NORVASC) 10 MG  tablet, Take 1 tablet (10 mg total) by mouth daily., Disp: 90 tablet, Rfl: 3 .  aspirin 325 MG tablet, Take 1 tablet (325 mg total) by mouth daily., Disp: 30 tablet, Rfl: 0 .  Cholecalciferol (VITAMIN D-3) 25 MCG (1000 UT) CAPS, Take 1,000 Units by mouth daily., Disp: , Rfl:  .  escitalopram (LEXAPRO) 10 MG tablet, Take 10 mg by mouth at bedtime. , Disp: , Rfl:  .  hydrOXYzine  (ATARAX/VISTARIL) 10 MG tablet, Take 10 mg by mouth 2 (two) times daily as needed for anxiety. , Disp: , Rfl:  .  Insulin Glargine, 1 Unit Dial, (TOUJEO SOLOSTAR) 300 UNIT/ML SOPN, Inject 15 Units into the skin at bedtime., Disp: 300 mL, Rfl: 5 .  PRESCRIPTION MEDICATION, See admin instructions. CPAP- At bedtime, Disp: , Rfl:  .  atorvastatin (LIPITOR) 80 MG tablet, Take 1 tablet (80 mg total) by mouth daily at 6 PM. (Patient not taking: Reported on 05/29/2019), Disp: 90 tablet, Rfl: 1 .  clopidogrel (PLAVIX) 75 MG tablet, Take 1 tablet (75 mg total) by mouth daily. (Patient not taking: Reported on 05/29/2019), Disp: 90 tablet, Rfl: 3 .  Dulaglutide 0.75 MG/0.5ML SOPN, INJECT 0.75 MG INTO THE SKIN ONCE A WEEK. (Patient not taking: Reported on 05/29/2019), Disp: 3 pen, Rfl: 3 .  lisinopril (PRINIVIL,ZESTRIL) 40 MG tablet, Take 1 tablet (40 mg total) by mouth daily. (Patient not taking: Reported on 05/29/2019), Disp: 30 tablet, Rfl: 2 .  metFORMIN (GLUCOPHAGE) 500 MG tablet, Take 1 tablet (500 mg total) by mouth 2 (two) times daily with a meal. (Patient not taking: Reported on 05/29/2019), Disp: 60 tablet, Rfl: 2  Exam: Current vital signs: BP (!) 161/104   Pulse (!) 110   Temp 99.5 F (37.5 C) (Oral)   Resp 20   SpO2 97%  Vital signs in last 24 hours: Temp:  [99.5 F (37.5 C)] 99.5 F (37.5 C) (09/23 1555) Pulse Rate:  [110-129] 110 (09/23 1830) Resp:  [14-24] 20 (09/23 1830) BP: (161-184)/(101-106) 161/104 (09/23 1715) SpO2:  [94 %-97 %] 97 % (09/23 1830) General: Awake alert in no distress HEENT: Normocephalic atraumatic, small tongue bite on the left side of the tongue. Lungs: Breathing well saturating normally on room air Cardiovascular: Regular rate rhythm-slightly tachycardic, no murmurs rubs gallops Abdomen: Nondistended nontender Neurological exam Awake alert oriented x3 Speech is clear Naming comprehension repetition fluency intact Cranial nerves: Pupils equal round react light,  extraocular movements intact, visual fields full, face is symmetric, facial sensations intact, auditory acuity is intact, palate midline, tongue midline. Motor exam: No drift in either of the 4 extremities on testing.  Bilaterally symmetrical strength in all fours Sensory exam: Decreased sensation to light touch on the left with no extinction. Coordination: Intact finger-nose-finger testing Gait testing deferred for patient safety and comfort NIH stroke scale-1 for sensory  Labs I have reviewed labs in epic and the results pertinent to this consultation are:  CBC    Component Value Date/Time   WBC 20.3 (H) 05/29/2019 1557   RBC 5.83 (H) 05/29/2019 1557   HGB 17.7 (H) 05/29/2019 1557   HCT 54.6 (H) 05/29/2019 1557   PLT 271 05/29/2019 1557   MCV 93.7 05/29/2019 1557   MCH 30.4 05/29/2019 1557   MCHC 32.4 05/29/2019 1557   RDW 12.1 05/29/2019 1557   LYMPHSABS 1.2 05/29/2019 1557   MONOABS 0.8 05/29/2019 1557   EOSABS 0.0 05/29/2019 1557   BASOSABS 0.1 05/29/2019 1557    CMP  Component Value Date/Time   NA 134 (L) 05/29/2019 1557   NA 141 05/22/2017 1458   K 4.5 05/29/2019 1557   CL 97 (L) 05/29/2019 1557   CO2 15 (L) 05/29/2019 1557   GLUCOSE 396 (H) 05/29/2019 1557   BUN 11 05/29/2019 1557   BUN 17 05/22/2017 1458   CREATININE 1.48 (H) 05/29/2019 1557   CREATININE 1.17 02/01/2014 0940   CALCIUM 8.9 05/29/2019 1557   PROT 7.3 05/29/2019 1557   ALBUMIN 3.5 05/29/2019 1557   AST 31 05/29/2019 1557   ALT 41 05/29/2019 1557   ALKPHOS 85 05/29/2019 1557   BILITOT 1.0 05/29/2019 1557   GFRNONAA >60 05/29/2019 1557   GFRAA >60 05/29/2019 1557    Imaging I have reviewed the images obtained:  CT-scan of the brain-no acute changes.  Mild chronic small vessel disease.  Chronic bilateral thalamic lacunes.  Chronic basal ganglia lacunes.   Assessment: 33 year old man past history of diabetes, hypertension, strokes of unclear etiology with an unremarkable work-up for  stroke in the young presenting to emergency room for evaluation of an episode of unconsciousness which she has no recollection of. Examination consistent with some left-sided subjective numbness as well as a left-sided tongue bite. No reported seizure activity that he could recollect but does have a tongue bite that he does not recall details of.  No other witnesses who could have substantiated on the history. Given the history of strokes already in this young patient, I would work him up for strokes and seizures as an inpatient. The caveat to all this is that he recently started a new antidepressant-his medication list has Atarax as well as Lexapro listed-he could not tell me which 1 is new-this episode could have just been a side effect of these medication as well but given his substantial cerebrovascular risk factor history, I would work-up as below.   Impression: Episode of unresponsiveness-evaluate for stroke versus seizure versus toxic metabolic encephalopathy from medication  Recommendations: Admit to hospitalist MRI brain without contrast Routine EEG in the morning Frequent neurochecks PT OT I do not see the need for doing a full stroke work-up unless the MRI of the brain comes out positive for stroke for now. Neurology will continue to follow with you as the results become available. Plan discussed with Dr. Rodena Medin in the ER.  -- Milon Dikes, MD [facility] Pager: [phone] If 7pm to 7am, please call on call as listed on AMION.

## 2019-05-29 NOTE — ED Notes (Signed)
Pt. States tingling in left arm and slight pain over right eye. Alerty and able to mfollow commands

## 2019-05-29 NOTE — ED Provider Notes (Signed)
[facility] Provider Note   CSN: [id number] Arrival date & time: 05/29/19  1546     History   Chief Complaint Chief Complaint  Patient presents with  . Altered Mental Status    HPI Thomas Burch is a 33 y.o. male.     33 year old male with prior medical history as detailed below presents for evaluation of transient alteration in his mental status.  Patient arrives by EMS.  Patient is unable to provide significant details of the events leading to his arrival.  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  The child found him asleep on the couch and was unable to arouse him.  Child then notified her parents and then EMS arrived for evaluation.  EMS reports the patient was initially confused but arousable.  His mental status has improved during transport to the ED.  He complains of a very mild headache.  He denies weakness.  He denies visual change, speech changes, nausea, vomiting, fever, chest pain, shortness of breath, or other acute complaint.  Of note, patient denies prior history of seizures.  He is not currently on antiepileptics.  He denies loss of bladder or bowel control.     The history is provided by the patient and medical records.  Altered Mental Status Presenting symptoms: confusion   Severity:  Mild Most recent episode:  Today Episode history:  Unable to specify Timing:  Unable to specify Progression:  Improving   Past Medical History:  Diagnosis Date  . 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 Diabetes mellitus type 2 with complications (HCC)   . Dysphasia 05/12/2017  . Headache   . Hypertension   . Stroke (HCC)    Infarct of left cingulate gyrus    Patient Active Problem List   Diagnosis Date Noted  . Hospital discharge follow-up 10/26/2018  . Aphasia   . Snoring 12/04/2017  . Expressive aphasia 05/12/2017  . H/O cerebral artery stenosis  05/12/2017  . Diabetes mellitus type II, uncontrolled (HCC), with obesity   . Hyperlipidemia   . CVA (cerebral vascular accident) (HCC) 05/02/2017  . Essential hypertension 02/01/2014  . Morbid obesity (HCC) 02/01/2014    Past Surgical History:  Procedure Laterality Date  . TEE WITHOUT CARDIOVERSION N/A 05/04/2017   Procedure: TRANSESOPHAGEAL ECHOCARDIOGRAM (TEE);  Surgeon: Elease HashimotoNahser, Deloris PingPhilip J, MD;  [facility];  Service: Cardiovascular;  Laterality: N/A;        Home Medications    Prior to Admission medications   Medication Sig Start Date End Date Taking? Authorizing Provider  amLODipine (NORVASC) 10 MG tablet Take 1 tablet (10 mg total) by mouth daily. 12/04/17   Marvel PlanXu, Jindong, MD  aspirin 325 MG tablet Take 1 tablet (325 mg total) by mouth daily. 10/25/18   Dollene ClevelandAnderson, Hannah C, DO  atorvastatin (LIPITOR) 80 MG tablet Take 1 tablet (80 mg total) by mouth daily at 6 PM. 08/04/17   Shirley, [place], DO  cholecalciferol (VITAMIN D) 25 MCG (1000 UT) tablet Take 1,000 Units by mouth daily.    [provider]  clopidogrel (PLAVIX) 75 MG tablet Take 1 tablet (75 mg total) by mouth daily. 12/04/17   Marvel PlanXu, Jindong, MD  Dulaglutide 0.75 MG/0.5ML SOPN INJECT 0.75 MG INTO THE SKIN ONCE A WEEK. 11/16/18   Shirley, [place], DO  escitalopram (LEXAPRO) 10 MG tablet  05/28/19   [provider]  hydrOXYzine (ATARAX/VISTARIL) 10 MG tablet  05/28/19  [provider]  Insulin Glargine, 1 Unit Dial, (TOUJEO SOLOSTAR) 300 UNIT/ML SOPN Inject 15 Units into the skin at bedtime. 10/24/18   Dollene Cleveland, DO  lisinopril (PRINIVIL,ZESTRIL) 40 MG tablet Take 1 tablet (40 mg total) by mouth daily. 11/10/18   Shirley, [place], DO  metFORMIN (GLUCOPHAGE) 500 MG tablet Take 1 tablet (500 mg total) by mouth 2 (two) times daily with a meal. 11/10/18   Shirley, [place], DO    Family History Family History  Problem Relation Age of Onset  . Hypertension Father   . Stroke Father   .  Hyperlipidemia Father   . Diabetes Maternal Grandmother   . Clotting disorder Neg Hx     Social History Social History   Tobacco Use  . Smoking status: Never Smoker  . Smokeless tobacco: Never Used  Substance Use Topics  . Alcohol use: No    Comment: 1 drink per week  . Drug use: No     Allergies   Patient has no known allergies.   Review of Systems Review of Systems  Psychiatric/Behavioral: Positive for confusion.  All other systems reviewed and are negative.    Physical Exam Updated Vital Signs BP (!) 162/106   Pulse (!) 125   Temp 99.5 F (37.5 C) (Oral)   Resp 14   SpO2 95%   Physical Exam Vitals signs and nursing note reviewed.  Constitutional:      General: He is not in acute distress.    Appearance: He is well-developed.  HENT:     Head: Normocephalic and atraumatic.     Mouth/Throat:     Comments: Small abrasion noted to the distal left tongue.  Patient denies recalling biting his tongue.  No active bleeding noted. Eyes:     Conjunctiva/sclera: Conjunctivae normal.     Pupils: Pupils are equal, round, and reactive to light.  Neck:     Musculoskeletal: Normal range of motion and neck supple.  Cardiovascular:     Rate and Rhythm: Normal rate and regular rhythm.     Heart sounds: Normal heart sounds.  Pulmonary:     Effort: Pulmonary effort is normal. No respiratory distress.     Breath sounds: Normal breath sounds.  Abdominal:     General: There is no distension.     Palpations: Abdomen is soft.     Tenderness: There is no abdominal tenderness.  Musculoskeletal: Normal range of motion.        General: No deformity.  Skin:    General: Skin is warm and dry.  Neurological:     General: No focal deficit present.     Mental Status: He is alert and oriented to person, place, and time. Mental status is at baseline.     Cranial Nerves: No cranial nerve deficit.     Motor: No weakness.     Comments: Normal speech  No facial droop  VAN negative         ED Treatments / Results  Labs (all labs ordered are listed, but only abnormal results are displayed) Labs Reviewed  CBC WITH DIFFERENTIAL/PLATELET - Abnormal; Notable for the following components:      Result Value   WBC 20.3 (*)    RBC 5.83 (*)    Hemoglobin 17.7 (*)    HCT 54.6 (*)    Neutro Abs 18.2 (*)    Abs Immature Granulocytes 0.12 (*)    All other components within normal limits  COMPREHENSIVE METABOLIC PANEL - Abnormal; Notable for  the following components:   Sodium 134 (*)    Chloride 97 (*)    CO2 15 (*)    Glucose, Bld 396 (*)    Creatinine, Ser 1.48 (*)    Anion gap 22 (*)    All other components within normal limits  LACTIC ACID, PLASMA - Abnormal; Notable for the following components:   Lactic Acid, Venous 3.4 (*)    All other components within normal limits  CBG MONITORING, ED - Abnormal; Notable for the following components:   Glucose-Capillary 370 (*)    All other components within normal limits  SARS CORONAVIRUS 2 (TAT 6-24 HRS)  ETHANOL  PROTIME-INR  URINALYSIS, ROUTINE W REFLEX MICROSCOPIC  RAPID URINE DRUG SCREEN, HOSP PERFORMED  LACTIC ACID, PLASMA  TROPONIN I (HIGH SENSITIVITY)  TROPONIN I (HIGH SENSITIVITY)    EKG None  Radiology Ct Head Wo Contrast  Result Date: 05/29/2019 CLINICAL DATA:  Altered level consciousness (LOC), unexplained. EXAM: CT HEAD WITHOUT CONTRAST TECHNIQUE: Contiguous axial images were obtained from the base of the skull through the vertex without intravenous contrast. COMPARISON:  CT angiogram head/neck 10/22/2018, brain MRI 10/22/2018 FINDINGS: Brain: No evidence of acute intracranial hemorrhage. No demarcated cortical infarction. Redemonstrated chronic lacunar infarcts within the bilateral thalami. Additional known chronic infarcts within bilateral basal ganglia were better appreciated on brain MRI 10/22/2018. No evidence of intracranial mass. No midline shift or extra-axial fluid collection. Minimal patchy  hypodensity within the cerebral white matter is nonspecific, but consistent chronic small vessel ischemic disease. Cerebral volume is normal for age. Partially empty sella turcica. Vascular: No definite hyperdense vessel. Skull: No calvarial fracture. Sinuses/Orbits: Small inferior right maxillary sinus mucous retention cyst partially imaged. Mild scattered ethmoid sinus mucosal thickening. No significant effusion. IMPRESSION: No CT evidence of acute intracranial abnormality. Mild chronic small vessel ischemic disease. Redemonstrated chronic bilateral thalamic lacunar infarcts. Additional known chronic lacunar infarcts within the basal ganglia were better appreciated on brain MRI 10/22/2018. Right maxillary sinus mucous retention cyst. Electronically Signed   By: Jackey Loge   On: 05/29/2019 18:24   Dg Chest Port 1 View  Result Date: 05/29/2019 CLINICAL DATA:  Pt states he laid down to take a nap because he didn't feel good and was woken up by EMS. Pt states he just started taking an antidepressant and thinks that may have caused this episode. No heart or lung diseases. HTN and diabetes. Nonsmoker. EXAM: PORTABLE CHEST 1 VIEW COMPARISON:  Chest radiograph 04/29/2017 FINDINGS: Stable cardiomediastinal contours which are within normal limits. The lungs are clear. No pneumothorax or pleural effusion. No acute finding in the visualized skeleton. IMPRESSION: No evidence of active disease. Electronically Signed   By: Emmaline Kluver M.D.   On: 05/29/2019 16:36    Procedures Procedures (including critical care time)  CRITICAL CARE Performed by: Wynetta Fines   Total critical care time: 45 minutes  Critical care time was exclusive of separately billable procedures and treating other patients.  Critical care was necessary to treat or prevent imminent or life-threatening deterioration.  Critical care was time spent personally by me on the following activities: development of treatment plan with  patient and/or surrogate as well as nursing, discussions with consultants, evaluation of patient's response to treatment, examination of patient, obtaining history from patient or surrogate, ordering and performing treatments and interventions, ordering and review of laboratory studies, ordering and review of radiographic studies, pulse oximetry and re-evaluation of patient's condition.   Medications Ordered in ED Medications  sodium chloride 0.9 %  bolus 500 mL (has no administration in time range)     Initial Impression / Assessment and Plan / ED Course  I have reviewed the triage vital signs and the nursing notes.  Pertinent labs & imaging results that were available during my care of the patient were reviewed by me and considered in my medical decision making (see chart for details).        MDM  Screen complete  YOGESH COMINSKY was evaluated in Emergency Department on 05/29/2019 for the symptoms described in the history of present illness. He was evaluated in the context of the global COVID-19 pandemic, which necessitated consideration that the patient might be at risk for infection with the SARS-CoV-2 virus that causes COVID-19. Institutional protocols and algorithms that pertain to the evaluation of patients at risk for COVID-19 are in a state of rapid change based on information released by regulatory bodies including the CDC and federal and state organizations. These policies and algorithms were followed during the patient's care in the ED.  Patient is presenting for evaluation of reported episode of AMS.  His mentation has improved.  He appears to be close to his baseline.  Patient's presentation is concerning for possible seizure-like activity.  Work-up thus far in the ED does not reveal other significant abnormality.  Neurology is aware of case and will evaluate in consultation.  Family medicine is aware of case and will evaluate for admission for further work-up and  observation.  Patient is aware of plan of care.   Final Clinical Impressions(s) / ED Diagnoses   Final diagnoses:  Altered mental status, unspecified altered mental status type    ED Discharge Orders    None       Valarie Merino, MD 05/29/19 [phone]

## 2019-05-29 NOTE — Discharge Summary (Signed)
[facility] Discharge Summary  Patient name: Thomas Burch Medical record number: [id number] Date of birth: Mar 24, 1986 Age: 33 y.o. Gender: male Date of Admission: 05/29/2019  Date of Discharge: 06/01/2019 Admitting Physician: Leeanne Rio, MD  Primary Care Provider: Shirley, [place], DO Consultants: neuro  Indication for Hospitalization: seizure like activity 2/2 stroke  Discharge Diagnoses/Problem List:  Stroke Seizure like activity HTN HLD DMII Obesity AKI Elevated troponin Hematuria OSA Polycythemia Leukocytosis   Disposition: Home  Discharge Condition: Stable, improved  Discharge Exam:  General: alert, lying comfortably in bed, NAD Cardiovascular: Regular rate and rhythm, no murmur/rub/gallop Respiratory: Clear to auscultation bilaterally, normal effort of breathing Abdomen: Soft, nontender, nondistended, normal bowel sounds present Extremities: No edema Neuro: CNs II through XII intact, PERRLA, EOMI, bilateral upper extremities strength 5/5 and sensation 5/5, AO x4   Brief Hospital Course:  Thomas Burch was admitted on 9/23 after having an episode of altered mental status concerning for possible seizure or stroke with evidence of tongue biting, right arm contracture, and postictal state.  He has a history of multiple CVAs.  CT head was negative for acute abnormality.  He obtained an EEG that did not show seizure-like activity, and an MRI brain was significant for small areas of acute ischemia in the frontal and parietal areas.  MRA head and neck did not show significant abnormalities, and echo was also noncontributory.  Lower extremity venous Dopplers were negative for DVT.  Overnight on 9/24, he developed altered mental status, so an overnight EEG was performed on 9/25, which was negative for seizure activity.  Nevertheless, he was started on Keppra 500 mg twice daily and was told not to drive for 6 months.  Patient had not been compliant  with his atorvastatin 80 mg, lisinopril, metformin, and Plavix, and his hemoglobin A1c was indicative of poor control of his type 2 diabetes at 11.5.  He was continued on aspirin 325 mg and put back on Plavix 75 mg as well as atorvastatin 80 mg.  Neurology added Zetia for cholesterol control as well.  Patient was advised to follow-up with neurology outpatient.  Issues for Follow Up:  1. Please continue to counsel the patient on adherence to his medication regimen since this will hopefully prevent strokes in the future. 2. Patient reports that he is taking Toujeo rather than Trulicity, and his N8G is uncontrolled at 11.5.  Please reassess his diabetes medication regimen.  He was counseled on restarting metformin. 3. Ensure that patient is taking Keppra 500 mg twice daily for possible seizures even though EEGs were negative since he displayed symptoms of seizure-like activity before and during admission. 4. Patient was increased from amlodipine 5 mg to amlodipine 10 mg during admission.  Please ensure that he is taking amlodipine and lisinopril for blood pressure control.  Significant Procedures: EEG x 2  Significant Labs and Imaging:  Recent Labs  Lab 05/30/19 0009 05/31/19 0907 06/01/19 0211  WBC 17.7* 11.0* 13.3*  HGB 15.6 15.5 14.7  HCT 45.0 46.7 44.2  PLT 290 267 257   Recent Labs  Lab 05/29/19 1557 05/29/19 2018 05/30/19 0009 05/31/19 0907 06/01/19 0211  NA 134*  --  136 138 138  K 4.5  --  4.1 4.0 3.1*  CL 97*  --  99 102 102  CO2 15*  --  27 28 27   GLUCOSE 396*  --  307* 276* 172*  BUN 11  --  10 10 9   CREATININE 1.48* 1.08 1.30* 1.19  1.06  CALCIUM 8.9  --  8.4* 8.7* 8.2*  ALKPHOS 85  --   --   --   --   AST 31  --   --   --   --   ALT 41  --   --   --   --   ALBUMIN 3.5  --   --   --   --     Results/Tests Pending at Time of Discharge: none  Discharge Medications:  Allergies as of 06/01/2019   No Known Allergies     Medication List    STOP taking these  medications   Dulaglutide 0.75 MG/0.5ML Sopn     TAKE these medications   amLODipine 10 MG tablet Commonly known as: NORVASC Take 1 tablet (10 mg total) by mouth daily.   aspirin 325 MG tablet Take 1 tablet (325 mg total) by mouth daily.   atorvastatin 80 MG tablet Commonly known as: LIPITOR Take 1 tablet (80 mg total) by mouth daily at 6 PM.   clopidogrel 75 MG tablet Commonly known as: PLAVIX Take 1 tablet (75 mg total) by mouth daily.   escitalopram 10 MG tablet Commonly known as: LEXAPRO Take 10 mg by mouth at bedtime.   ezetimibe 10 MG tablet Commonly known as: ZETIA Take 1 tablet (10 mg total) by mouth daily. Start taking on: June 02, 2019   hydrOXYzine 10 MG tablet Commonly known as: ATARAX/VISTARIL Take 10 mg by mouth 2 (two) times daily as needed for anxiety.   Insulin Glargine (1 Unit Dial) 300 UNIT/ML Sopn Commonly known as: Toujeo SoloStar Inject 15 Units into the skin at bedtime.   levETIRAcetam 500 MG tablet Commonly known as: KEPPRA Take 1 tablet (500 mg total) by mouth 2 (two) times daily.   lisinopril 40 MG tablet Commonly known as: ZESTRIL Take 1 tablet (40 mg total) by mouth daily.   metFORMIN 500 MG tablet Commonly known as: GLUCOPHAGE Take 1 tablet (500 mg total) by mouth 2 (two) times daily with a meal.   PRESCRIPTION MEDICATION See admin instructions. CPAP- At bedtime   Vitamin D-3 25 MCG (1000 UT) Caps Take 1,000 Units by mouth daily.       Discharge Instructions: Please refer to Patient Instructions section of EMR for full details.  Patient was counseled important signs and symptoms that should prompt return to medical care, changes in medications, dietary instructions, activity restrictions, and follow up appointments.   Follow-Up Appointments: Follow-up Information    Micki Riley, MD. Schedule an appointment as soon as possible for a visit in 4 week(s).   Specialties: Neurology, Radiology Contact information: [address] [place] [address] [phone]        Marthenia Rolling, DO. Go on 06/04/2019.   Specialty: Family Medicine Why: Please go to your follow up appointment at 10:00AM. Contact information: [address] [place] [address] [phone]           Lennox Solders, MD 06/01/2019, 4:33 PM PGY-3, [facility]

## 2019-05-29 NOTE — ED Triage Notes (Signed)
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 Child was unable to wake pt up. On scene pt easily aroused but lethargic. repetitive questioning. Stroke screen cleared per EMS.  Endorses migraine last night CBG 398 o2 93-95% @ 4L [place] HR 140 with LVH 182/123

## 2019-05-30 ENCOUNTER — Observation Stay (HOSPITAL_COMMUNITY): Payer: Commercial Managed Care - PPO

## 2019-05-30 ENCOUNTER — Encounter (HOSPITAL_COMMUNITY): Payer: Self-pay

## 2019-05-30 ENCOUNTER — Other Ambulatory Visit: Payer: Self-pay

## 2019-05-30 DIAGNOSIS — Z8673 Personal history of transient ischemic attack (TIA), and cerebral infarction without residual deficits: Secondary | ICD-10-CM

## 2019-05-30 DIAGNOSIS — I639 Cerebral infarction, unspecified: Secondary | ICD-10-CM

## 2019-05-30 DIAGNOSIS — R4182 Altered mental status, unspecified: Secondary | ICD-10-CM

## 2019-05-30 DIAGNOSIS — E119 Type 2 diabetes mellitus without complications: Secondary | ICD-10-CM

## 2019-05-30 DIAGNOSIS — R29898 Other symptoms and signs involving the musculoskeletal system: Secondary | ICD-10-CM

## 2019-05-30 LAB — CBC
HCT: 45 % (ref 39.0–52.0)
Hemoglobin: 15.6 g/dL (ref 13.0–17.0)
MCH: 30.2 pg (ref 26.0–34.0)
MCHC: 34.7 g/dL (ref 30.0–36.0)
MCV: 87.2 fL (ref 80.0–100.0)
Platelets: 290 10*3/uL (ref 150–400)
RBC: 5.16 MIL/uL (ref 4.22–5.81)
RDW: 12.1 % (ref 11.5–15.5)
WBC: 17.7 10*3/uL — ABNORMAL HIGH (ref 4.0–10.5)
nRBC: 0 % (ref 0.0–0.2)

## 2019-05-30 LAB — GLUCOSE, CAPILLARY
Glucose-Capillary: 333 mg/dL — ABNORMAL HIGH (ref 70–99)
Glucose-Capillary: 204 mg/dL — ABNORMAL HIGH (ref 70–99)
Glucose-Capillary: 195 mg/dL — ABNORMAL HIGH (ref 70–99)
Glucose-Capillary: 187 mg/dL — ABNORMAL HIGH (ref 70–99)

## 2019-05-30 LAB — BASIC METABOLIC PANEL
Anion gap: 10 (ref 5–15)
BUN: 10 mg/dL (ref 6–20)
CO2: 27 mmol/L (ref 22–32)
Calcium: 8.4 mg/dL — ABNORMAL LOW (ref 8.9–10.3)
Chloride: 99 mmol/L (ref 98–111)
Creatinine, Ser: 1.3 mg/dL — ABNORMAL HIGH (ref 0.61–1.24)
GFR calc Af Amer: >60 mL/min (ref >60)
GFR calc non Af Amer: >60 mL/min (ref >60)
Glucose, Bld: 307 mg/dL — ABNORMAL HIGH (ref 70–99)
Potassium: 4.1 mmol/L (ref 3.5–5.1)
Sodium: 136 mmol/L (ref 135–145)

## 2019-05-30 LAB — LIPID PANEL
Cholesterol: 233 mg/dL — ABNORMAL HIGH (ref 0–200)
HDL: 38 mg/dL — ABNORMAL LOW (ref >40)
LDL Cholesterol: 168 mg/dL — ABNORMAL HIGH (ref 0–99)
Total CHOL/HDL Ratio: 6.1 RATIO
Triglycerides: 134 mg/dL (ref <150)
VLDL: 27 mg/dL (ref 0–40)

## 2019-05-30 LAB — HEMOGLOBIN A1C
Hgb A1c MFr Bld: 11.5 % — ABNORMAL HIGH (ref 4.8–5.6)
Mean Plasma Glucose: 283.35 mg/dL

## 2019-05-30 LAB — TROPONIN I (HIGH SENSITIVITY)
Troponin I (High Sensitivity): 66 ng/L — ABNORMAL HIGH (ref <18)
Troponin I (High Sensitivity): 67 ng/L — ABNORMAL HIGH (ref <18)
Troponin I (High Sensitivity): 63 ng/L — ABNORMAL HIGH (ref <18)
Troponin I (High Sensitivity): 53 ng/L — ABNORMAL HIGH (ref <18)

## 2019-05-30 LAB — LACTIC ACID, PLASMA: Lactic Acid, Venous: 1.5 mmol/L (ref 0.5–1.9)

## 2019-05-30 MED ORDER — SODIUM CHLORIDE 0.9 % IV SOLN
INTRAVENOUS | Status: DC
  Administered 2019-05-30 – 2019-05-31 (×2): via INTRAVENOUS

## 2019-05-30 MED ORDER — LORAZEPAM 2 MG/ML IJ SOLN
2.0000 mg | INTRAMUSCULAR | Status: DC | PRN
  Administered 2019-05-30: 1 mg via INTRAVENOUS
  Filled 2019-05-30: qty 1

## 2019-05-30 MED ORDER — GADOBUTROL 1 MMOL/ML IV SOLN
10.0000 mL | Freq: Once | INTRAVENOUS | Status: AC | PRN
  Administered 2019-05-30: 10 mL via INTRAVENOUS

## 2019-05-30 NOTE — Progress Notes (Signed)
Patient placed on CPAP for HS using nasal mask as per patients home regimen.  Auto titration mode (min 5, max 20) used.  Patient is comfortable, able to remove and replace mask at will and is familiar with equipment and procedure. BBS clear.  RR 18.

## 2019-05-30 NOTE — Progress Notes (Signed)
MRI completed: 1. Small acute right frontal and parietal lobe infarcts with small volume petechial hemorrhage. 2. Chronic small vessel ischemia.  Discussed with primary team. Recommendations given for MRA of head and neck, TTE, cardiac telemetry. Continue ASA. Stroke team to follow in the AM.   Electronically signed: Dr. Kerney Elbe

## 2019-05-30 NOTE — Procedures (Signed)
Patient Name: Thomas Burch  MRN: [id number]  Epilepsy Attending: Lora Havens  Referring Physician/Provider: Dr Josephine Igo Date: 05/30/2019 Duration: 23.32 mins  Patient history: 33yo M with h/o stroke who presented with ams. EEG to evaluate for seizure  Level of alertness: awake  AEDs during EEG study: None  Technical aspects: This EEG study was done with scalp electrodes positioned according to the 10-20 International system of electrode placement. Electrical activity was acquired at a sampling rate of 500Hz  and reviewed with a high frequency filter of 70Hz  and a low frequency filter of 1Hz . EEG data were recorded continuously and digitally stored.   DESCRIPTION:  The posterior dominant rhythm consists of 8-9Hz  activity of moderate voltage (25-35 uV) seen predominantly in posterior head regions, asymmetric decreased right and reactive to eye opening and eye closing. There is an excessive amount of 15 to 18 Hz, 2-3 uV beta activity with irregular morphology distributed symmetrically and diffusely. There is also continuous low amplitude 2-5Hz  theta-delta slowing in right hemisphere, maximal parieto-occipital. Hyperventilation and photic stimulation were not performed.   ABNORMALITY 1. Continuous slow, right hemisphere 2. Excessive beta, generalized  IMPRESSION: This study is suggestive of cortical dysfunction in right hemisphere, maximal parieto-occipital. No seizures or epileptiform discharges were seen throughout the recording.   The excessive beta activity seen in the background is most likely due to the effect of benzodiazepine and is a benign EEG pattern.

## 2019-05-30 NOTE — Progress Notes (Signed)
RN paged MD d/t elevated Trop. RN informed Pt MD Trop labs will cont trend

## 2019-05-30 NOTE — Progress Notes (Signed)
Family Medicine Teaching Service Daily Progress Note Intern Pager: [phone]  Patient name: Thomas Burch Medical record number: [id number] Date of birth: December 21, 1985 Age: 33 y.o. Gender: male  Primary Care Provider: Shirley, [place], DO Consultants: Neurology Code Status: Full  Pt Overview and Major Events to Date:  9/23- admitted, CT head, EEG 9/24- MRI   Assessment and Plan: Thomas Burch is a 33 y.o. male presenting with AMS . PMH is significant for a past medical history of multiple CVAs (most recent in 10/2018), DMT2, HTN, HLD, OSA  #Altered mental status secondary to TIA versus seizure Patient presented with transient AMS, now resolved.  Patient states that he did not sleep well last night, but feels well overall and is back at his baseline mentation.  He is AO x4.  Concern for TIA versus seizure, likely etiology with seizure due to a likely postictal state, tingling in his left forearm, contracture of an upper extremity found by EMS at the time.   EEG reveals right-sided slowing, and in note reporting results from neurology, there is a suggestion about potential benzodiazepines causing the right-sided slowing, however patient had a negative UDS, and after review of the [facility], he did not receive any benzodiazepines in the hospital.  Hypertension within appropriate parameters.  Lactic acid trended down from 3.4-1.5, will not continue to follow.  A1c 11.5.  Lipid panel back, total cholesterol 233, HDL 38, LDL 168, triglycerides 134.  Patient has been on high intensity statin before, will restart.  Awaiting MRI brain results and neurology recommendations.  Admit to FPTS, attending Dr. Ardelia Mems, cardiac telemetry  Neurology consulted in ED, recommendations appreciated  Seizure precautions  Neuro checks  Permissive hypertension with goal less than systolic 269, diastolic less than 485, until cleared by neurology  IV labetalol to maintain blood pressures per goal above  MRI  Brain  PT/OT  Continue ASA  Tylenol PRN headache  #AKI-mildly improved Baseline creatinine around 1.  Patient presents with creatinine of 1.4, and 1.3 today..  Status post 1.5 L normal saline bolus.  Patient mildly dehydrated as is evidenced by UA with ketones.  Avoid nephro toxic agents  Repeat creatinine in a.m.  Start maintenance IV fluids  #Leukocytosis with neutrophil predominance-improved Possible stress reaction due to unprovoked seizure.  Patient is afebrile.  No history of infectious symptoms.  Leukocytosis somewhat increased to 17.7 today  Monitor CBC in a.m.  #Polycythemia-improved Mildly elevated at 17.7 admission, today hemoglobin decreased to 15.6. Possibly due to dehydration.  Does not smoke.  Will monitor with CBC  We will start maintenance IV fluids  #Elevated troponin-resolved Initial high-sensitivity troponin was negative at 7.  Second troponin was mildly elevated at 30, increased to a high of 67, has now trended downward at 64, we can discontinue trending at this point time. EKG showed sinus tachycardia but did not show any other acute ST abnormalities.  Patient does not have any chest pain or shortness of breath.  Discontinue trending troponins  Discontinue cardiac telemetry  #Hematuria  calcium oxalate crystals in urine  proteinuria UA obtained in the ED.  Patient did not have any symptoms of dysuria or urinary pain.  Calcium oxalate likely due to passing stone recently.  Patient's proteinuria may be from renal damage from uncontrolled diabetes.  Patient may need ACE inhibitor/arb due to proteinuria  Recommend repeating UA in the outpatient setting  Recommend getting outpatient microalbumin/creatinine ratio when acute abnormalities on UA and AKI resolved to look for evidence of proteinuria  Will start maintenance IV fluids  #Diabetes type 2, uncontrolled Patient takes metformin 500 mg twice daily, Toujeo 15 units nightly.  Last A1c was 11.4  on 10/2018.  Presenting with CBG near 400 on mission.  A1c repeated in hospital shows 11.5.  Hold metformin  Continue insulin glargine 15 units nightly,  Moderate sliding scale insulin with 3 times daily CBG checks/AC  #Prior CVAs Patient had multiple strokes 2018 and most recently in 10/2018.  CT head without contrast showed no acute stroke, however showed chronic micro vessel disease, bilateral chronic thalamic lacunar infarcts.  Allowing for permissive hypertension, until cleared by neurology  Neurology consulted in ED, appreciate recommendations  Awaiting brain MRI  #Hypertension Hypertensive on admission to systolics in the 180s admission, most recently his blood pressure has been 170/125..Takes amlodipine 10, lisinopril 40 at home  Permissive hypertension until cleared by neurology  IV labetalol as above  Hold amlodipine and lisinopril  #Hyperlipidemia On atorvastatin 80 mg.  The panel reveals total cholesterol of 233, HDL 38, LDL 168, triglycerides 134.  Continue atorvastatin  #OSA  CPAP nightly  #Depression and anxiety On home escitalopram 10 mg and Atarax 10 mg twice daily as needed  Continue home medication  FEN/GI: Heart healthy diet/carb modified PPx: Lovenox VTE PBX  Disposition: To home once medical work-up complete  Subjective:  Patient appears sleepy, but reports that he is at his baseline, AO x4.  He states that he did not sleep well overnight, but is feeling much better overall today.  He reports some tingling in his left hand today, but his motor skills and sensation are fully intact.  Objective: Temp:  [98.4 F (36.9 C)-99.5 F (37.5 C)] 98.4 F (36.9 C) (09/24 0543) Pulse Rate:  [91-129] 91 (09/24 0543) Resp:  [14-24] 18 (09/24 0112) BP: (160-184)/(101-147) 171/126 (09/24 1013) SpO2:  [93 %-99 %] 99 % (09/24 0543) Weight:  [104.3 kg] 104.3 kg (09/24 0003) Physical Exam: General:, Overweight man lying comfortably in bed,  NAD Cardiovascular: Regular rate and rhythm, no murmur/rub/gallop Respiratory: Clear to auscultation bilaterally, normal effort of breathing Abdomen: Soft, nontender, nondistended, normal bowel sounds present Extremities: No edema Neuro: CNs II through XII intact, PERRLA, EOMI, bilateral upper extremities strength 5/5 and sensation 5/5, DTRs intact, AO x4  Laboratory: Recent Labs  Lab 05/29/19 1557 05/29/19 2018 05/30/19 0009  WBC 20.3* 20.3* 17.7*  HGB 17.7* 16.1 15.6  HCT 54.6* 46.8 45.0  PLT 271 302 290   Recent Labs  Lab 05/29/19 1557 05/29/19 2018 05/30/19 0009  NA 134*  --  136  K 4.5  --  4.1  CL 97*  --  99  CO2 15*  --  27  BUN 11  --  10  CREATININE 1.48* 1.08 1.30*  CALCIUM 8.9  --  8.4*  PROT 7.3  --   --   BILITOT 1.0  --   --   ALKPHOS 85  --   --   ALT 41  --   --   AST 31  --   --   GLUCOSE 396*  --  307*    EEG: 1. Continuous slow, right hemisphere 2. Excessive beta, generalized  IMPRESSION: This study is suggestive of cortical dysfunction in right hemisphere, maximal parieto-occipital. No seizures or epileptiform discharges were seen throughout the recording.   The excessive beta activity seen in the background is most likely due to the effect of benzodiazepine and is a benign EEG pattern.  Imaging/Diagnostic Tests: Ct Head Wo  Contrast  Result Date: 05/29/2019 CLINICAL DATA:  Altered level consciousness (LOC), unexplained. EXAM: CT HEAD WITHOUT CONTRAST TECHNIQUE: Contiguous axial images were obtained from the base of the skull through the vertex without intravenous contrast. COMPARISON:  CT angiogram head/neck 10/22/2018, brain MRI 10/22/2018 FINDINGS: Brain: No evidence of acute intracranial hemorrhage. No demarcated cortical infarction. Redemonstrated chronic lacunar infarcts within the bilateral thalami. Additional known chronic infarcts within bilateral basal ganglia were better appreciated on brain MRI 10/22/2018. No evidence of intracranial  mass. No midline shift or extra-axial fluid collection. Minimal patchy hypodensity within the cerebral white matter is nonspecific, but consistent chronic small vessel ischemic disease. Cerebral volume is normal for age. Partially empty sella turcica. Vascular: No definite hyperdense vessel. Skull: No calvarial fracture. Sinuses/Orbits: Small inferior right maxillary sinus mucous retention cyst partially imaged. Mild scattered ethmoid sinus mucosal thickening. No significant effusion. IMPRESSION: No CT evidence of acute intracranial abnormality. Mild chronic small vessel ischemic disease. Redemonstrated chronic bilateral thalamic lacunar infarcts. Additional known chronic lacunar infarcts within the basal ganglia were better appreciated on brain MRI 10/22/2018. Right maxillary sinus mucous retention cyst. Electronically Signed   By: Jackey Loge   On: 05/29/2019 18:24   Dg Chest Port 1 View  Result Date: 05/29/2019 CLINICAL DATA:  Pt states he laid down to take a nap because he didn't feel good and was woken up by EMS. Pt states he just started taking an antidepressant and thinks that may have caused this episode. No heart or lung diseases. HTN and diabetes. Nonsmoker. EXAM: PORTABLE CHEST 1 VIEW COMPARISON:  Chest radiograph 04/29/2017 FINDINGS: Stable cardiomediastinal contours which are within normal limits. The lungs are clear. No pneumothorax or pleural effusion. No acute finding in the visualized skeleton. IMPRESSION: No evidence of active disease. Electronically Signed   By: Emmaline Kluver M.D.   On: 05/29/2019 16:36    Shirlean Mylar, MD 05/30/2019, 12:50 PM PGY-1, [facility] Family Medicine FPTS Intern pager: [phone], text pages welcome

## 2019-05-30 NOTE — Progress Notes (Signed)
OT Cancellation Note  Patient Details Name: Thomas Burch MRN: [id number] DOB: 1986-01-01   Cancelled Treatment:    Reason Eval/Treat Not Completed: Patient at procedure or test/ unavailable.  Pt with another discipline.  Will reattempt.  Lucille Passy, OTR/L [place] Pager [phone] Office [phone]   Lucille Passy M 05/30/2019, 12:44 PM

## 2019-05-30 NOTE — Progress Notes (Addendum)
Reason for consult: Episode of unconsciousness, history of strokes  Subjective: Patient states he remembers laying on the couch. His friend's 35 year old son was in house with him talking to his mother on phone and noted that he became unresponsive. He called 911 and when EMS arrived they found him unconscious. After waking up, he remembers feeling numb on left arm as if it "is asleep" which is gradually improving. He also noted that he had a tongue bite on the left side of his tongue. Of note, patient states he had his first dose of lexapro on the night before. No other concerns.  Denies previous seizures, febrile seizures, head injury, meningitis/encephalitis, family history of seizure, alcohol use,   ROS: negative except above  Examination  Vital signs in last 24 hours: Temp:  [98.4 F (36.9 C)-99.5 F (37.5 C)] 98.4 F (36.9 C) (09/24 0543) Pulse Rate:  [91-129] 91 (09/24 0543) Resp:  [14-24] 18 (09/24 0112) BP: (160-184)/(101-147) 171/126 (09/24 1013) SpO2:  [93 %-99 %] 99 % (09/24 0543) Weight:  [104.3 kg] 104.3 kg (09/24 0003)  General: lying in bed, NAD CVS: pulse-normal rate and rhythm RS: breathing comfortably Extremities: normal, warm  Neuro: MS: Alert, oriented, follows commands CN: pupils equal and reactive,  EOMI, face symmetric, tongue midline, normal sensation over face, Motor: 5/5 strength in all 4 extremities except 4+/5 in left hand Reflexes: 2+ bilaterally over patella, biceps, plantars: flexor Coordination: normal Gait: not tested  Basic Metabolic Panel: Recent Labs  Lab 05/29/19 1557 05/29/19 2018 05/30/19 0009  NA 134*  --  136  K 4.5  --  4.1  CL 97*  --  99  CO2 15*  --  27  GLUCOSE 396*  --  307*  BUN 11  --  10  CREATININE 1.48* 1.08 1.30*  CALCIUM 8.9  --  8.4*    CBC: Recent Labs  Lab 05/29/19 1557 05/29/19 2018 05/30/19 0009  WBC 20.3* 20.3* 17.7*  NEUTROABS 18.2*  --   --   HGB 17.7* 16.1 15.6  HCT 54.6* 46.8 45.0  MCV 93.7  88.0 87.2  PLT 271 302 290     Coagulation Studies: Recent Labs    05/29/19 1557  LABPROT 12.9  INR 1.0    Imaging CT head wo contrast 05/29/2019:  No CT evidence of acute intracranial abnormality. Mild chronic small vessel ischemic disease. Redemonstrated chronic bilateral thalamic lacunar infarcts. Additional known chronic lacunar infarcts within the basal ganglia were better appreciated on brain MRI 10/22/2018.  ASSESSMENT AND PLAN  Left had weakness New onset seizure Chronic bilateral thalamic infarcts - Patient had a seizure yesterday with left side tongue bite and persistent left hand weakness/ Todd's paresis. Possible etiology of new onset seizure is acute stroke vs sec to prior strokes vs use of lexapro  Recommendations - Will obtain MRI w and wo contrast to assess for any acute abnormality - Will review MRI and discuss with patient need for AED  - Discussed seizure precautions including DO NOT DRIVE for 6 months - PRN IV ativan 2mg  for GTC>2 mins or focal seizure >99mins  Please call neurohospitalist for any further questions.   I have spent a total of   79minutes with the patient reviewing hospital notes,  test results, labs and examining the patient as well as establishing an assessment and plan that was discussed personally with the patient.  > 50% of time was spent in direct patient care.       [name] O  Hortense Ramal

## 2019-05-30 NOTE — Evaluation (Signed)
Physical Therapy Evaluation Patient Details Name: Thomas Burch MRN: [id number] DOB: Dec 04, 1985 Today's Date: 05/30/2019   History of Present Illness  33 year old male admitted after being found unresponsive while watching a 35 year old. Patient does not recall events leading up to admission. PMH to include CVA, DM, HTN. imaging shows no acute CVA.  Clinical Impression  Patient received in bed. Agrees to PT evaluation. Reports he does not recall events leading up to hospitalization. Patient alert, oriented, pleasant. Patient has high blood pressure but is allowed to be up to 210/120. Patient demonstrates the need for supervision with bed mobility and transfers. He ambulated without AD 150 feet. No problems noted, but will benefit from supervised mobility due to potential for seizure.      Follow Up Recommendations No PT follow up    Equipment Recommendations  None recommended by PT    Recommendations for Other Services       Precautions / Restrictions Precautions Precautions: Fall Precaution Comments: moderate fall Restrictions Weight Bearing Restrictions: No      Mobility  Bed Mobility Overal bed mobility: Needs Assistance Bed Mobility: Supine to Sit;Sit to Supine     Supine to sit: Supervision Sit to supine: Supervision      Transfers Overall transfer level: Needs assistance Equipment used: None Transfers: Sit to/from Stand Sit to Stand: Supervision            Ambulation/Gait Ambulation/Gait assistance: Supervision Gait Distance (Feet): 150 Feet Assistive device: None Gait Pattern/deviations: WFL(Within Functional Limits) Gait velocity: WNL   General Gait Details: Patient requires supervision for safety due to possible seizure.  Stairs            Wheelchair Mobility    Modified Rankin (Stroke Patients Only)       Balance Overall balance assessment: Mild deficits observed, not formally tested                                            Pertinent Vitals/Pain Pain Assessment: No/denies pain    Home Living Family/patient expects to be discharged to:: Private residence Living Arrangements: Other relatives Available Help at Discharge: Family;Available PRN/intermittently;Friend(s) Type of Home: House Home Access: Stairs to enter   [employer] of Steps: 1 Home Layout: One level Home Equipment: None Additional Comments: patient reports he takes care of older relative    Prior Function Level of Independence: Independent               Hand Dominance   Dominant Hand: Right    Extremity/Trunk Assessment   Upper Extremity Assessment Upper Extremity Assessment: Overall WFL for tasks assessed    Lower Extremity Assessment Lower Extremity Assessment: Overall WFL for tasks assessed    Cervical / Trunk Assessment Cervical / Trunk Assessment: Normal  Communication   Communication: No difficulties  Cognition Arousal/Alertness: Awake/alert Behavior During Therapy: WFL for tasks assessed/performed Overall Cognitive Status: Within Functional Limits for tasks assessed                                        General Comments      Exercises     Assessment/Plan    PT Assessment Patient needs continued PT services  PT Problem List Decreased strength;Decreased balance       PT Treatment  Interventions Therapeutic activities;Gait training;Stair training;Balance training;Patient/family education    PT Goals (Current goals can be found in the Care Plan section)  Acute Rehab PT Goals Patient Stated Goal: to return home PT Goal Formulation: With patient Time For Goal Achievement: 06/13/19 Potential to Achieve Goals: Good    Frequency Min 2X/week   Barriers to discharge Decreased caregiver support will be alone when he returns home, but has a friend that can check in on him.    Co-evaluation               AM-PAC PT "6 Clicks" Mobility  Outcome Measure Help  needed turning from your back to your side while in a flat bed without using bedrails?: None Help needed moving from lying on your back to sitting on the side of a flat bed without using bedrails?: None Help needed moving to and from a bed to a chair (including a wheelchair)?: A Little Help needed standing up from a chair using your arms (e.g., wheelchair or bedside chair)?: A Little Help needed to walk in hospital room?: A Little Help needed climbing 3-5 steps with a railing? : A Little 6 Click Score: 20    End of Session Equipment Utilized During Treatment: Gait belt Activity Tolerance: Patient tolerated treatment well Patient left: in bed Nurse Communication: Mobility status PT Visit Diagnosis: Unsteadiness on feet (R26.81)    Time: 6295-2841 PT Time Calculation (min) (ACUTE ONLY): 23 min   Charges:   PT Evaluation $PT Eval Moderate Complexity: 1 Mod PT Treatments $Gait Training: 8-22 mins        Thomas Burch, PT, GCS 05/30/19,10:24 AM

## 2019-05-30 NOTE — Progress Notes (Signed)
RN contacted ED as Pt belongings that includes sweatpants, tshirt, socks, boxers and a pair of black sketcher shoes. Per ED secretary, Pt belongings have been found and brought to [facility].

## 2019-05-31 ENCOUNTER — Inpatient Hospital Stay (HOSPITAL_COMMUNITY): Payer: Commercial Managed Care - PPO

## 2019-05-31 DIAGNOSIS — R569 Unspecified convulsions: Secondary | ICD-10-CM

## 2019-05-31 DIAGNOSIS — Z8673 Personal history of transient ischemic attack (TIA), and cerebral infarction without residual deficits: Secondary | ICD-10-CM

## 2019-05-31 DIAGNOSIS — I69398 Other sequelae of cerebral infarction: Secondary | ICD-10-CM

## 2019-05-31 DIAGNOSIS — I340 Nonrheumatic mitral (valve) insufficiency: Secondary | ICD-10-CM

## 2019-05-31 DIAGNOSIS — E1165 Type 2 diabetes mellitus with hyperglycemia: Secondary | ICD-10-CM

## 2019-05-31 DIAGNOSIS — I10 Essential (primary) hypertension: Secondary | ICD-10-CM

## 2019-05-31 DIAGNOSIS — E782 Mixed hyperlipidemia: Secondary | ICD-10-CM

## 2019-05-31 LAB — CBC
HCT: 46.7 % (ref 39.0–52.0)
Hemoglobin: 15.5 g/dL (ref 13.0–17.0)
MCH: 29.8 pg (ref 26.0–34.0)
MCHC: 33.2 g/dL (ref 30.0–36.0)
MCV: 89.6 fL (ref 80.0–100.0)
Platelets: 267 10*3/uL (ref 150–400)
RBC: 5.21 MIL/uL (ref 4.22–5.81)
RDW: 12.3 % (ref 11.5–15.5)
WBC: 11 10*3/uL — ABNORMAL HIGH (ref 4.0–10.5)
nRBC: 0 % (ref 0.0–0.2)

## 2019-05-31 LAB — BASIC METABOLIC PANEL
Anion gap: 8 (ref 5–15)
BUN: 10 mg/dL (ref 6–20)
CO2: 28 mmol/L (ref 22–32)
Calcium: 8.7 mg/dL — ABNORMAL LOW (ref 8.9–10.3)
Chloride: 102 mmol/L (ref 98–111)
Creatinine, Ser: 1.19 mg/dL (ref 0.61–1.24)
GFR calc Af Amer: >60 mL/min (ref >60)
GFR calc non Af Amer: >60 mL/min (ref >60)
Glucose, Bld: 276 mg/dL — ABNORMAL HIGH (ref 70–99)
Potassium: 4 mmol/L (ref 3.5–5.1)
Sodium: 138 mmol/L (ref 135–145)

## 2019-05-31 LAB — GLUCOSE, CAPILLARY
Glucose-Capillary: 170 mg/dL — ABNORMAL HIGH (ref 70–99)
Glucose-Capillary: 246 mg/dL — ABNORMAL HIGH (ref 70–99)
Glucose-Capillary: 225 mg/dL — ABNORMAL HIGH (ref 70–99)
Glucose-Capillary: 188 mg/dL — ABNORMAL HIGH (ref 70–99)
Glucose-Capillary: 185 mg/dL — ABNORMAL HIGH (ref 70–99)

## 2019-05-31 LAB — D-DIMER, QUANTITATIVE: D-Dimer, Quant: 0.63 ug/mL-FEU — ABNORMAL HIGH (ref 0.00–0.50)

## 2019-05-31 LAB — PLATELET INHIBITION P2Y12: Platelet Function  P2Y12: 166 [PRU] — ABNORMAL LOW (ref 182–335)

## 2019-05-31 LAB — ECHOCARDIOGRAM COMPLETE
Height: 70 in
Weight: 3680 oz

## 2019-05-31 LAB — FIBRINOGEN: Fibrinogen: 421 mg/dL (ref 210–475)

## 2019-05-31 MED ORDER — EZETIMIBE 10 MG PO TABS
10.0000 mg | ORAL_TABLET | Freq: Every day | ORAL | Status: DC
  Administered 2019-05-31 – 2019-06-01 (×2): 10 mg via ORAL
  Filled 2019-05-31 (×2): qty 1

## 2019-05-31 MED ORDER — AMLODIPINE BESYLATE 5 MG PO TABS
5.0000 mg | ORAL_TABLET | Freq: Every day | ORAL | Status: DC
  Administered 2019-05-31 – 2019-06-01 (×2): 5 mg via ORAL
  Filled 2019-05-31 (×2): qty 1

## 2019-05-31 MED ORDER — LEVETIRACETAM 500 MG PO TABS
500.0000 mg | ORAL_TABLET | Freq: Two times a day (BID) | ORAL | Status: DC
  Administered 2019-05-31 – 2019-06-01 (×2): 500 mg via ORAL
  Filled 2019-05-31 (×2): qty 1

## 2019-05-31 MED ORDER — GADOBUTROL 1 MMOL/ML IV SOLN
10.0000 mL | Freq: Once | INTRAVENOUS | Status: AC | PRN
  Administered 2019-05-31: 10 mL via INTRAVENOUS

## 2019-05-31 MED ORDER — INSULIN GLARGINE 100 UNIT/ML ~~LOC~~ SOLN
20.0000 [IU] | Freq: Every day | SUBCUTANEOUS | Status: DC
  Administered 2019-05-31: 20 [IU] via SUBCUTANEOUS
  Filled 2019-05-31 (×3): qty 0.2

## 2019-05-31 MED ORDER — CLOPIDOGREL BISULFATE 75 MG PO TABS
75.0000 mg | ORAL_TABLET | Freq: Every day | ORAL | Status: DC
  Administered 2019-05-31 – 2019-06-01 (×2): 75 mg via ORAL
  Filled 2019-05-31 (×2): qty 1

## 2019-05-31 NOTE — Progress Notes (Signed)
LTM EEG hooked up and running - no initial skin breakdown - push button tested - neuro notified.  

## 2019-05-31 NOTE — Progress Notes (Addendum)
STROKE TEAM PROGRESS NOTE   INTERVAL HISTORY Pt sitting in bed. Recounted HPI with me. Pt was babysitting her friend's baby but then next thing remembered was that he was in stretcher with EMS. He apparently bit her left side of tongue and he apparently wet himself during the event, consistent with seizure episode. However, MRI showed right MCA scattered small / punctate infarcts.   Vitals:   05/30/19 1013 05/30/19 1427 05/30/19 1929 05/31/19 0102  BP: (!) 171/126 (!) 173/123 (!) 172/116 (!) 171/116  Pulse:  86 89 90  Resp:   15 (!) 22  Temp:  98.7 F (37.1 C) 98.3 F (36.8 C) 98.3 F (36.8 C)  TempSrc:  Oral Oral Oral  SpO2:  (!) 18% 95% 93%  Weight:      Height:        CBC:  Recent Labs  Lab 05/29/19 1557  05/30/19 0009 05/31/19 0907  WBC 20.3*   < > 17.7* 11.0*  NEUTROABS 18.2*  --   --   --   HGB 17.7*   < > 15.6 15.5  HCT 54.6*   < > 45.0 46.7  MCV 93.7   < > 87.2 89.6  PLT 271   < > 290 267   < > = values in this interval not displayed.    Basic Metabolic Panel:  Recent Labs  Lab 05/30/19 0009 05/31/19 0907  NA 136 138  K 4.1 4.0  CL 99 102  CO2 27 28  GLUCOSE 307* 276*  BUN 10 10  CREATININE 1.30* 1.19  CALCIUM 8.4* 8.7*   Lipid Panel:     Component Value Date/Time   CHOL 233 (H) 05/30/2019 0009   TRIG 134 05/30/2019 0009   HDL 38 (L) 05/30/2019 0009   CHOLHDL 6.1 05/30/2019 0009   VLDL 27 05/30/2019 0009   LDLCALC 168 (H) 05/30/2019 0009   HgbA1c:  Lab Results  Component Value Date   HGBA1C 11.5 (H) 05/30/2019   Urine Drug Screen:     Component Value Date/Time   LABOPIA NONE DETECTED 05/29/2019 1922   COCAINSCRNUR NONE DETECTED 05/29/2019 1922   LABBENZ NONE DETECTED 05/29/2019 1922   AMPHETMU NONE DETECTED 05/29/2019 1922   THCU NONE DETECTED 05/29/2019 1922   LABBARB NONE DETECTED 05/29/2019 1922    Alcohol Level     Component Value Date/Time   ETH <10 05/29/2019 1557    IMAGING Ct Head Wo Contrast  Result Date:  05/29/2019 CLINICAL DATA:  Altered level consciousness (LOC), unexplained. EXAM: CT HEAD WITHOUT CONTRAST TECHNIQUE: Contiguous axial images were obtained from the base of the skull through the vertex without intravenous contrast. COMPARISON:  CT angiogram head/neck 10/22/2018, brain MRI 10/22/2018 FINDINGS: Brain: No evidence of acute intracranial hemorrhage. No demarcated cortical infarction. Redemonstrated chronic lacunar infarcts within the bilateral thalami. Additional known chronic infarcts within bilateral basal ganglia were better appreciated on brain MRI 10/22/2018. No evidence of intracranial mass. No midline shift or extra-axial fluid collection. Minimal patchy hypodensity within the cerebral white matter is nonspecific, but consistent chronic small vessel ischemic disease. Cerebral volume is normal for age. Partially empty sella turcica. Vascular: No definite hyperdense vessel. Skull: No calvarial fracture. Sinuses/Orbits: Small inferior right maxillary sinus mucous retention cyst partially imaged. Mild scattered ethmoid sinus mucosal thickening. No significant effusion. IMPRESSION: No CT evidence of acute intracranial abnormality. Mild chronic small vessel ischemic disease. Redemonstrated chronic bilateral thalamic lacunar infarcts. Additional known chronic lacunar infarcts within the basal ganglia were better appreciated on brain  MRI 10/22/2018. Right maxillary sinus mucous retention cyst. Electronically Signed   By: Jackey Loge   On: 05/29/2019 18:24   Mr Angio Head Wo Contrast  Result Date: 05/31/2019 CLINICAL DATA:  Stroke follow-up EXAM: MRA NECK WITHOUT AND WITH CONTRAST MRA HEAD WITHOUT CONTRAST TECHNIQUE: Multiplanar and multiecho pulse sequences of the neck were obtained without and with intravenous contrast. Angiographic images of the neck were obtained using MRA technique without and with intravenous contast.; Angiographic images of the Circle of Willis were obtained using MRA technique  without intravenous contrast. CONTRAST:  72mL GADAVIST GADOBUTROL 1 MMOL/ML IV SOLN COMPARISON:  Brain MRI from yesterday FINDINGS: MRA NECK FINDINGS Intermittent motion artifact. Antegrade flow in the carotid and vertebral arteries on time-of-flight imaging. Postcontrast imaging shows the arch which is normal in appearance. There is 3 vessel branching. The cervical carotids are smooth and diffusely patent. Both vertebral arteries are likewise smooth and widely patent to the dura. MRA HEAD FINDINGS Symmetric robust flow in the carotid siphons. 50% narrowing of the supraclinoid right ICA with smooth appearance. Mild right proximal M1 stenosis. Negative for branch occlusion. Mild atheromatous type irregularity of bilateral MCA branches. The vertebral, basilar, PICA, and superior cerebellar arteries are symmetrically widely patent. Moderate right P2 segment narrowing. Negative for aneurysm or vascular malformation. IMPRESSION: Neck MRA: Negative.  No stenosis or embolic source identified. Intracranial MRA: 1. 50% narrowing of the supraclinoid right ICA. 2. Moderate right P2 segment narrowing. Electronically Signed   By: Marnee Spring M.D.   On: 05/31/2019 10:22   Mr Angio Neck W Wo Contrast  Result Date: 05/31/2019 CLINICAL DATA:  Stroke follow-up EXAM: MRA NECK WITHOUT AND WITH CONTRAST MRA HEAD WITHOUT CONTRAST TECHNIQUE: Multiplanar and multiecho pulse sequences of the neck were obtained without and with intravenous contrast. Angiographic images of the neck were obtained using MRA technique without and with intravenous contast.; Angiographic images of the Circle of Willis were obtained using MRA technique without intravenous contrast. CONTRAST:  50mL GADAVIST GADOBUTROL 1 MMOL/ML IV SOLN COMPARISON:  Brain MRI from yesterday FINDINGS: MRA NECK FINDINGS Intermittent motion artifact. Antegrade flow in the carotid and vertebral arteries on time-of-flight imaging. Postcontrast imaging shows the arch which is  normal in appearance. There is 3 vessel branching. The cervical carotids are smooth and diffusely patent. Both vertebral arteries are likewise smooth and widely patent to the dura. MRA HEAD FINDINGS Symmetric robust flow in the carotid siphons. 50% narrowing of the supraclinoid right ICA with smooth appearance. Mild right proximal M1 stenosis. Negative for branch occlusion. Mild atheromatous type irregularity of bilateral MCA branches. The vertebral, basilar, PICA, and superior cerebellar arteries are symmetrically widely patent. Moderate right P2 segment narrowing. Negative for aneurysm or vascular malformation. IMPRESSION: Neck MRA: Negative.  No stenosis or embolic source identified. Intracranial MRA: 1. 50% narrowing of the supraclinoid right ICA. 2. Moderate right P2 segment narrowing. Electronically Signed   By: Marnee Spring M.D.   On: 05/31/2019 10:22   Mr Laqueta Jean LX Contrast  Result Date: 05/30/2019 CLINICAL DATA:  Seizure versus stroke.  Episode of unresponsiveness. EXAM: MRI HEAD WITHOUT AND WITH CONTRAST TECHNIQUE: Multiplanar, multiecho pulse sequences of the brain and surrounding structures were obtained without and with intravenous contrast. CONTRAST:  67mL GADAVIST GADOBUTROL 1 MMOL/ML IV SOLN COMPARISON:  Head CT 05/29/2019 and MRI 10/22/2018 FINDINGS: Multiple sequences are mildly to moderately motion degraded. Brain: Small acute infarcts involve cortex and white matter in the posterior right frontal lobe and right parietal lobe, MCA/watershed  distribution. There is a small amount of associated petechial hemorrhage without malignant type hemorrhage. No intracranial mass effect or extra-axial fluid collection is identified. No abnormal enhancement is identified. Chronic lacunar infarcts are again noted in the thalami and basal ganglia/deep white matter. There is mild cerebral atrophy. The hippocampi are symmetric in size and signal within limitations of motion artifact on dedicated temporal  lobe sequences. Vascular: Major intracranial vascular flow voids are preserved. Skull and upper cervical spine: Unremarkable bone marrow signal. Sinuses/Orbits: Unremarkable orbits. Small right maxillary sinus mucous retention cyst. No significant mastoid fluid. Other: None. IMPRESSION: 1. Small acute right frontal and parietal lobe infarcts with small volume petechial hemorrhage. 2. Chronic small vessel ischemia as above. Electronically Signed   By: Logan Bores M.D.   On: 05/30/2019 17:15   Dg Chest Port 1 View  Result Date: 05/29/2019 CLINICAL DATA:  Pt states he laid down to take a nap because he didn't feel good and was woken up by EMS. Pt states he just started taking an antidepressant and thinks that may have caused this episode. No heart or lung diseases. HTN and diabetes. Nonsmoker. EXAM: PORTABLE CHEST 1 VIEW COMPARISON:  Chest radiograph 04/29/2017 FINDINGS: Stable cardiomediastinal contours which are within normal limits. The lungs are clear. No pneumothorax or pleural effusion. No acute finding in the visualized skeleton. IMPRESSION: No evidence of active disease. Electronically Signed   By: Audie Pinto M.D.   On: 05/29/2019 16:36    PHYSICAL EXAM  Temp:  [98.3 F (36.8 C)-98.9 F (37.2 C)] 98.9 F (37.2 C) (09/25 1525) Pulse Rate:  [89-91] 91 (09/25 1525) Resp:  [15-22] 20 (09/25 1525) BP: (171-208)/(116-130) 208/130 (09/25 1525) SpO2:  [93 %-97 %] 97 % (09/25 1525)  General - Obese, well developed, in no apparent distress.  Ophthalmologic - fundi not visualized due to noncooperation.  Cardiovascular - Regular rhythm and rate.  Mental Status -  Level of arousal and orientation to time, place, and person were intact. Language including expression, naming, repetition, comprehension was assessed and found intact. Attention span and concentration were normal. Fund of Knowledge was assessed and was intact.  Cranial Nerves II - XII - II - Visual field intact OU. III, IV,  VI - Extraocular movements intact. V - Facial sensation intact bilaterally. VII - Facial movement intact bilaterally. VIII - Hearing & vestibular intact bilaterally. X - Palate elevates symmetrically. XI - Chin turning & shoulder shrug intact bilaterally. XII - Tongue protrusion intact.  Motor Strength - The patient's strength was normal in all extremities and pronator drift was absent.  Bulk was normal and fasciculations were absent.   Motor Tone - Muscle tone was assessed at the neck and appendages and was normal.  Reflexes - The patient's reflexes were symmetrical in all extremities and he had no pathological reflexes.  Sensory - Light touch, temperature/pinprick were assessed and were symmetrical.    Coordination - The patient had normal movements in the hands and feet with no ataxia or dysmetria.  Tremor was absent.  Gait and Station - deferred.   ASSESSMENT/PLAN Thomas Burch is a 33 y.o. male with history of DB, HTN, previous stroke of unknown etiology with no residual deficits presenting with an episode of unresponsiveness. Found to have tongue bite.  Stroke: right MCA scattered small / punctate infarcts, embolic pattern, secondary to large vessel disease (left ICA supraclinoid stenosis) vs. Hypercoagulability from metabolic syndrome  MRI  Small R frontal & parietal lobe scattered infarcts w/ small  petechial hemorrhage. Small vessel disease.   MRA head R supraclinoid ICA 50% stenosis, moderate R P2 narrowing  MRA neck Unremarkable   2D Echo EF 55-60%  LE venous doppler pending  LDL 168  HgbA1c 11.5  P2Y12 166, D-dimer 0.63, fibrinogen 421  Lovenox 40 mg sq daily for VTE prophylaxis  aspirin 325 mg daily prior to admission, now on aspirin 325 mg daily and plavix 75 daily. Continue long term on discharge.   Therapy recommendations:  No therapy needs  Disposition:  Return home  Seizures, new onset  Given cortical stroke, will put on keppra 500mg   bid  EEG no sz  No driving until 6 months free and on physician's care - discussed with pt and he expressed understanding  Will follow up with neurology as outpt  ?? Related to new antidepressant Lexapro ?? - recommend pt to discuss with his psychiatrist for alternative antidepressant  Hx stroke/TIA - full workup completed in past, negative for source  10/2018 -  left occipital and right centrum semiovale infarcts still concerning for synchronized small vessel disease source in setting on uncontrolled risk factors.  CTA head and neck unremarkable, but right more than left M2 M3 irregularity.  EF 60 to 65%.  A1c 11.9.  LDL 118.  Put on DAPT 3 months.  Found to have OSA  05/2017 - Stroke:Acute/subacute infarct left anterior thalamus and posterior internal capsule as noted on recent CT. 5 mm acute infarct left cingulate gyrus, secondary to small vessel disease, in the setting of poorly controlled hypertension, previously untreated diabetes, and recent stroke.  Outpatient 30-day CardioNet monitoring negative for A. fib  04/2017 - Stroke:Right CR, right temporal and left splenium punctate infarcts, CTA head and neck right P2 stenosis, EF 50 to 55%.  TCD negative for PFO.  TEE unremarkable.  CSF negative.  Hypercoagulable work-up negative.  LDL 115 and A1c 11.9.  Put on DAPT and Lipitor 80.  Could be synchronized small vessel disease, however, recommend 30 day cardiac event monitoring as outpt to rule out afib.  Hypertension  Elevated up to 171/116 . Permissive hypertension (OK if < 220/120) but gradually normalize in 5-7 days . Long-term BP goal normotensive  Hyperlipidemia  Home meds:  lipitor 80, resumed in hospital  LDL 168, goal < 70  Add zetia  Continue statin and zetia at discharge  Diabetes type II Uncontrolled   HgbA1c 11.5, goal < 7.0  CBGs  SSI  Close PCP follow up  Recommend endocrinology referral  Other Stroke Risk Factors  Obesity, Body mass index is 33 kg/m.,  recommend weight loss, diet and exercise as appropriate   Family hx stroke (father)  obstructive sleep apnea on CPAP (Sleep Smart Stroke Trial)  Other Active Problems  Depression/anxiety with new med - on atarax and lexapro  AKI  Leukocytosis  Polycythemia  Elevated troponin, resolved  Hematuria, proteinuria  Hospital day # 2  Neurology will sign off. Please call with questions. Pt will [facility] stroke [facility]. Thanks for the consult.   Marvel PlanJindong Dearius Hoffmann, MD PhD Stroke Neurology 05/31/2019 7:00 PM    To contact Stroke Continuity provider, please refer to [email or web address]. After hours, contact General Neurology

## 2019-05-31 NOTE — Progress Notes (Signed)
RN paged MD as Pt became disoriented to general conversation, Pt was found OOB full clothed and wandering for phone and bank card to leave. RN reoriented Pt to time, place, and situation. Pt was assisted  back in Bed. Pt attempted to get out of bed and stated he had to find a ride home for in the morning when he gets discharged. RN informed Pt that he is not up for discharge and that we will assist him with getting home upon discharge.

## 2019-05-31 NOTE — Progress Notes (Signed)
Patient arrived to the unit at the end of the shift, pt placed on the telemetry monitor, and EEG continues, and tele sitter at bedside, vitals taken and report given to the night shift nurse.

## 2019-05-31 NOTE — Progress Notes (Signed)
EEG complete - results pending 

## 2019-05-31 NOTE — Evaluation (Signed)
Occupational Therapy Evaluation Patient Details Name: Thomas Burch MRN: [id number] DOB: 1986-05-12 Today's Date: 05/31/2019    History of Present Illness 33 year old male admitted after being found unresponsive while watching a 35 year old. Patient does not recall events leading up to admission. PMH to include CVA, DM, HTN. MRI revealed small acute right frontal and parietal lobe infarcts with small volume petechial hemorrhage.    Clinical Impression   Pt admitted with the above diagnoses and presents with below problem list. Pt will benefit from continued acute OT to address the below listed deficits and maximize independence with basic ADLs prior to d/c home. PTA pt was independent with ADLs and IADLs. Pt presents with residual numbness in left hand and intermittent "foggy-headed" feeling. Pt is currently supervision with ADLs. Will follow acutely.      Follow Up Recommendations  No OT follow up    Equipment Recommendations  None recommended by OT    Recommendations for Other Services       Precautions / Restrictions Precautions Precautions: Fall Restrictions Weight Bearing Restrictions: No      Mobility Bed Mobility Overal bed mobility: Needs Assistance Bed Mobility: Supine to Sit;Sit to Supine     Supine to sit: Supervision Sit to supine: Supervision      Transfers Overall transfer level: Needs assistance Equipment used: None Transfers: Sit to/from Stand Sit to Stand: Supervision              Balance Overall balance assessment: Mild deficits observed, not formally tested                                         ADL either performed or assessed with clinical judgement   ADL Overall ADL's : Needs assistance/impaired Eating/Feeding: Set up;Sitting   Grooming: Supervision/safety;Standing;Oral care   Upper Body Bathing: Set up;Sitting   Lower Body Bathing: Supervison/ safety;Sit to/from stand   Upper Body Dressing : Set up;Sitting    Lower Body Dressing: Supervision/safety;Sit to/from stand   Toilet Transfer: Supervision/safety;Ambulation   Toileting- Clothing Manipulation and Hygiene: Supervision/safety;Sit to/from stand   Tub/ Shower Transfer: Supervision/safety   Functional mobility during ADLs: Supervision/safety General ADL Comments: Pt walked in the room, navigating obstacles. Completed oral care in standing.     Vision Baseline Vision/History: Wears glasses Wears Glasses: At all times Patient Visual Report: Other (comment)(1x blurriness in L eye yesterday, resolved.) Vision Assessment?: No apparent visual deficits     Perception     Praxis      Pertinent Vitals/Pain Pain Assessment: No/denies pain     Hand Dominance Right   Extremity/Trunk Assessment Upper Extremity Assessment Upper Extremity Assessment: LUE deficits/detail;Overall WFL for tasks assessed LUE Deficits / Details: pt reports hand is much improved from onset of symptoms but does report some residual numbness   Lower Extremity Assessment Lower Extremity Assessment: Overall WFL for tasks assessed   Cervical / Trunk Assessment Cervical / Trunk Assessment: Normal   Communication Communication Communication: No difficulties   Cognition Arousal/Alertness: Awake/alert Behavior During Therapy: WFL for tasks assessed/performed Overall Cognitive Status: Within Functional Limits for tasks assessed                                 General Comments: pt reporting some intermittent "foggy-headed" feelings including overnight   General Comments  Exercises     Shoulder Instructions      Home Living Family/patient expects to be discharged to:: Private residence Living Arrangements: Other relatives Available Help at Discharge: Family;Available PRN/intermittently;Friend(s) Type of Home: House Home Access: Stairs to enter [employer] of Steps: 1   Home Layout: One level     Bathroom Shower/Tub:  Teacher, early years/pre: Standard     Home Equipment: None   Additional Comments: patient reports he takes care of older relative      Prior Functioning/Environment Level of Independence: Independent        Comments: was working at [employer]         OT Problem List: Impaired balance (sitting and/or standing);Decreased knowledge of use of DME or AE;Decreased knowledge of precautions      OT Treatment/Interventions: Self-care/ADL training;Cognitive remediation/compensation;Therapeutic activities;Patient/family education;Balance training    OT Goals(Current goals can be found in the care plan section) Acute Rehab OT Goals Patient Stated Goal: to return home OT Goal Formulation: With patient Time For Goal Achievement: 06/14/19 Potential to Achieve Goals: Good ADL Goals Pt Will Perform Grooming: Independently;standing Additional ADL Goal #1: null  OT Frequency: Min 2X/week   Barriers to D/C:            Co-evaluation              AM-PAC OT "6 Clicks" Daily Activity     Outcome Measure Help from another person eating meals?: None Help from another person taking care of personal grooming?: None Help from another person toileting, which includes using toliet, bedpan, or urinal?: None Help from another person bathing (including washing, rinsing, drying)?: None Help from another person to put on and taking off regular upper body clothing?: None Help from another person to put on and taking off regular lower body clothing?: None 6 Click Score: 24   End of Session    Activity Tolerance: Patient tolerated treatment well Patient left: in bed;with call bell/phone within reach;with bed alarm set  OT Visit Diagnosis: Unsteadiness on feet (R26.81)                Time: 0454-0981 OT Time Calculation (min): 11 min Charges:  OT General Charges $OT Visit: 1 Visit OT Evaluation $OT Eval Low Complexity: [place], OT Acute Rehabilitation  Services Pager: [phone] Office: [phone]   Hortencia Pilar 05/31/2019, 9:26 AM

## 2019-05-31 NOTE — Progress Notes (Signed)
Per MD Pt is to be placed on telesitter

## 2019-05-31 NOTE — Progress Notes (Signed)
  Echocardiogram 2D Echocardiogram has been performed.  Thomas Burch 05/31/2019, 10:15 AM

## 2019-05-31 NOTE — Progress Notes (Signed)
Family Medicine Teaching Service Daily Progress Note Intern Pager: [phone]  Patient name: Thomas Burch Medical record number: [id number] Date of birth: 1985/09/13 Age: 33 y.o. Gender: male  Primary Care Provider: Shirley, [place], DO Consultants: Neurology Code Status: Full  Pt Overview and Major Events to Date:  9/23- admitted, CT head, EEG 9/24- MRI  9/25- MRA  Assessment and Plan: Thomas Burch is a 33 y.o. male presenting with AMS . PMH is significant for a past medical history of multiple CVAs (most recent in 10/2018), DMT2, HTN, HLD, OSA  #Altered mental status secondary to TIA versus seizure Patient presented with transient AMS yesterday, which had resolved during the day, but last night patient again became very confused and had to be reoriented many times as he kept trying to get up out of the bed to leave, not realizing he was in the hospital.  Thomas Burch was placed with him overnight. He is AO x4 again this morning.  MRI reveals small, acute right frontal and parietal lobe infarcts with a small petechial hemorrhage. Due to this finding and to patient's presentation, history, and fluctuating sensorium, he has likely had a seizure as well.  Neurology is following and patient will have an echocardiogram and MRA today.  Per discussion with Dr. Cheral Marker, patient will have an overnight continuous EEG with video.  There is also concern for hypercoagulable state that could be due to DVTs, patient will have lower extremity doppler ultrasound. Tomorrow patient will start Keppra 500 mg twice daily.  He will also have long-term Plavix treatment, and will follow-up with neurology as an outpatient in 4 weeks.  He will not be able to drive for at least 6 months.  Admit to [facility], attending Dr. Ardelia Mems, cardiac telemetry  Neurology, recommendations appreciated  Seizure precautions  Neuro checks  Permissive hypertension with goal less than systolic 147, diastolic less than 829, until cleared by  neurology  EEG w/ video overnight  PT/OT  Continue ASA  Tylenol PRN headache  #Prior CVAs Patient had multiple strokes 2018 and most recently in 10/2018.  MRI brain confirmed small, acute right frontal and parietal lobe infarcts with a small petechial hemorrhage. Awaiting final MRA and echo results. For LE doppler tomorrow per neurology recommendations to r/o DVT which could be causing hypercoagulability.  Allowing for permissive hypertension, until cleared by neurology  Neurology, appreciate recommendations  Awaiting MRA, echo, doppler US results  Plan for long term plavix and neurology f/u as op  #Hypertension Hypertensive on admission to [facility] in the [facility]Z admission, most recently his blood pressure has been ranging systolic 308-657, and diastolic 846-962. He takes amlodipine 10, lisinopril 40 at home, will restart gradually.  Will restart amlodipine 5 mg  Hold lisinopril for now  #Diabetes type 2, uncontrolled In the last 24hrs, patient's BG ranged from 170-333, he received 19U of aspart and 15U of glargine. Will increase Lantus to 20U qhs. Patient takes metformin 500 mg twice daily, Toujeo 15 units nightly. Presenting with CBG near 400 on admission.  A1c repeated in hospital shows 11.5.  Hold metformin  Increase insulin glargine to 20 units nightly,  Moderate sliding scale insulin with 3 times daily CBG checks/AC  #AKI-improving Baseline creatinine around 1, on admission creatinine of 1.4, and today improved to 1.19. Status post 1.5 L normal saline bolus.  Patient mildly dehydrated as was evidenced by UA with ketones.  Avoid nephrotoxic agents  Repeat creatinine in a.m.  Continue maintenance IV fluids\  #Hematuria  calcium oxalate crystals  in urine  proteinuria UA obtained in the ED revealed hematuria, calcium oxalate crystals, and proteinuria.  Patient did not have any symptoms of dysuria or urinary pain.  Patient's proteinuria may be from renal damage from  uncontrolled diabetes.  Patient may need ACE inhibitor/arb due to proteinuria  Recommend repeating UA in the outpatient setting  Recommend getting outpatient microalbumin/creatinine ratio when acute abnormalities on UA and AKI resolved to look for evidence of proteinuria  Continue maintenance IV fluids  #Leukocytosis with neutrophil predominance-improved Possible stress reaction due to possible seizure. WBC decreased to 11.  Monitor CBC in a.m.  #Polycythemia-improved Mildly elevated at 17.7 admission, today hemoglobin decreased to 15.5. Possibly due to dehydration.  Does not smoke.  Will monitor with CBC  continue maintenance IV fluids  #Elevated troponin-resolved Trended downwarnd. EKG showed sinus tachycardia.  Patient had no chest pain or shortness of breath.  #Hyperlipidemia On atorvastatin 80 mg.  The panel reveals total cholesterol of 233, HDL 38, LDL 168, triglycerides 134.  Continue atorvastatin  #OSA  CPAP nightly  #Depression and anxiety On home escitalopram 10 mg and Atarax 10 mg twice daily as needed  Continue home medication  FEN/GI: Heart healthy diet/carb modified PPx: Lovenox VTE PBX  Disposition: To home once medical work-up complete  Subjective:  Patient states that he had a confusing night, but reports that he is at his baseline this morning, AO x4.  The tingling in his left hand has now clearly resolved.  He is feeling much more comfortable at this time.  Objective: Temp:  [98.3 F (36.8 C)] 98.3 F (36.8 C) (09/25 0102) Pulse Rate:  [89-90] 90 (09/25 0102) Resp:  [15-22] 22 (09/25 0102) BP: (171-172)/(116) 171/116 (09/25 0102) SpO2:  [93 %-95 %] 93 % (09/25 0102) Physical Exam: General:, Overweight man lying comfortably in bed, NAD Cardiovascular: Regular rate and rhythm, no murmur/rub/gallop Respiratory: Clear to auscultation bilaterally, normal effort of breathing Abdomen: Soft, nontender, nondistended, normal bowel sounds  present Extremities: No edema Neuro: CNs II through XII intact, PERRLA, EOMI, bilateral upper extremities strength 5/5 and sensation 5/5, DTRs intact, AO x4  Laboratory: Recent Labs  Lab 05/29/19 2018 05/30/19 0009 05/31/19 0907  WBC 20.3* 17.7* 11.0*  HGB 16.1 15.6 15.5  HCT 46.8 45.0 46.7  PLT 302 290 267   Recent Labs  Lab 05/29/19 1557 05/29/19 2018 05/30/19 0009 05/31/19 0907  NA 134*  --  136 138  K 4.5  --  4.1 4.0  CL 97*  --  99 102  CO2 15*  --  27 28  BUN 11  --  10 10  CREATININE 1.48* 1.08 1.30* 1.19  CALCIUM 8.9  --  8.4* 8.7*  PROT 7.3  --   --   --   BILITOT 1.0  --   --   --   ALKPHOS 85  --   --   --   ALT 41  --   --   --   AST 31  --   --   --   GLUCOSE 396*  --  307* 276*    EEG: 1. Continuous slow, right hemisphere 2. Excessive beta, generalized  IMPRESSION: This study is suggestive of cortical dysfunction in right hemisphere, maximal parieto-occipital. No seizures or epileptiform discharges were seen throughout the recording.   The excessive beta activity seen in the background is most likely due to the effect of benzodiazepine and is a benign EEG pattern.  Imaging/Diagnostic Tests: Mr Angio Head Wo Contrast  Result Date: 05/31/2019 CLINICAL DATA:  Stroke follow-up EXAM: MRA NECK WITHOUT AND WITH CONTRAST MRA HEAD WITHOUT CONTRAST TECHNIQUE: Multiplanar and multiecho pulse sequences of the neck were obtained without and with intravenous contrast. Angiographic images of the neck were obtained using MRA technique without and with intravenous contast.; Angiographic images of the Circle of Willis were obtained using MRA technique without intravenous contrast. CONTRAST:  10mL GADAVIST GADOBUTROL 1 MMOL/ML IV SOLN COMPARISON:  Brain MRI from yesterday FINDINGS: MRA NECK FINDINGS Intermittent motion artifact. Antegrade flow in the carotid and vertebral arteries on time-of-flight imaging. Postcontrast imaging shows the arch which is normal in appearance.  There is 3 vessel branching. The cervical carotids are smooth and diffusely patent. Both vertebral arteries are likewise smooth and widely patent to the dura. MRA HEAD FINDINGS Symmetric robust flow in the carotid siphons. 50% narrowing of the supraclinoid right ICA with smooth appearance. Mild right proximal M1 stenosis. Negative for branch occlusion. Mild atheromatous type irregularity of bilateral MCA branches. The vertebral, basilar, PICA, and superior cerebellar arteries are symmetrically widely patent. Moderate right P2 segment narrowing. Negative for aneurysm or vascular malformation. IMPRESSION: Neck MRA: Negative.  No stenosis or embolic source identified. Intracranial MRA: 1. 50% narrowing of the supraclinoid right ICA. 2. Moderate right P2 segment narrowing. Electronically Signed   By: Marnee SpringJonathon  Watts M.D.   On: 05/31/2019 10:22   Mr Angio Neck W Wo Contrast  Result Date: 05/31/2019 CLINICAL DATA:  Stroke follow-up EXAM: MRA NECK WITHOUT AND WITH CONTRAST MRA HEAD WITHOUT CONTRAST TECHNIQUE: Multiplanar and multiecho pulse sequences of the neck were obtained without and with intravenous contrast. Angiographic images of the neck were obtained using MRA technique without and with intravenous contast.; Angiographic images of the Circle of Willis were obtained using MRA technique without intravenous contrast. CONTRAST:  10mL GADAVIST GADOBUTROL 1 MMOL/ML IV SOLN COMPARISON:  Brain MRI from yesterday FINDINGS: MRA NECK FINDINGS Intermittent motion artifact. Antegrade flow in the carotid and vertebral arteries on time-of-flight imaging. Postcontrast imaging shows the arch which is normal in appearance. There is 3 vessel branching. The cervical carotids are smooth and diffusely patent. Both vertebral arteries are likewise smooth and widely patent to the dura. MRA HEAD FINDINGS Symmetric robust flow in the carotid siphons. 50% narrowing of the supraclinoid right ICA with smooth appearance. Mild right proximal  M1 stenosis. Negative for branch occlusion. Mild atheromatous type irregularity of bilateral MCA branches. The vertebral, basilar, PICA, and superior cerebellar arteries are symmetrically widely patent. Moderate right P2 segment narrowing. Negative for aneurysm or vascular malformation. IMPRESSION: Neck MRA: Negative.  No stenosis or embolic source identified. Intracranial MRA: 1. 50% narrowing of the supraclinoid right ICA. 2. Moderate right P2 segment narrowing. Electronically Signed   By: Marnee SpringJonathon  Watts M.D.   On: 05/31/2019 10:22   Mr Laqueta JeanBrain W ZOWo Contrast  Result Date: 05/30/2019 CLINICAL DATA:  Seizure versus stroke.  Episode of unresponsiveness. EXAM: MRI HEAD WITHOUT AND WITH CONTRAST TECHNIQUE: Multiplanar, multiecho pulse sequences of the brain and surrounding structures were obtained without and with intravenous contrast. CONTRAST:  10mL GADAVIST GADOBUTROL 1 MMOL/ML IV SOLN COMPARISON:  Head CT 05/29/2019 and MRI 10/22/2018 FINDINGS: Multiple sequences are mildly to moderately motion degraded. Brain: Small acute infarcts involve cortex and white matter in the posterior right frontal lobe and right parietal lobe, MCA/watershed distribution. There is a small amount of associated petechial hemorrhage without malignant type hemorrhage. No intracranial mass effect or extra-axial fluid collection is identified. No abnormal enhancement is identified.  Chronic lacunar infarcts are again noted in the thalami and basal ganglia/deep white matter. There is mild cerebral atrophy. The hippocampi are symmetric in size and signal within limitations of motion artifact on dedicated temporal lobe sequences. Vascular: Major intracranial vascular flow voids are preserved. Skull and upper cervical spine: Unremarkable bone marrow signal. Sinuses/Orbits: Unremarkable orbits. Small right maxillary sinus mucous retention cyst. No significant mastoid fluid. Other: None. IMPRESSION: 1. Small acute right frontal and parietal lobe  infarcts with small volume petechial hemorrhage. 2. Chronic small vessel ischemia as above. Electronically Signed   By: Sebastian Ache M.D.   On: 05/30/2019 17:15    Shirlean Mylar, MD 05/31/2019, 3:14 PM PGY-1, [facility] FPTS Intern pager: [phone], text pages welcome

## 2019-05-31 NOTE — Progress Notes (Signed)
RN paged MD again d/t to CTA neck order needing to be changed to w and wo contrast

## 2019-05-31 NOTE — Progress Notes (Signed)
RN paged MD d/t Pt needs a CTA order needing updated. Awaiting callback

## 2019-05-31 NOTE — Social Work (Signed)
CSW acknowledging consult for advanced directives; have requested MD team consult spiritual care services for [facility] paperwork and assistance.   CSW signing off. Please consult if any additional needs arise.  Alexander Mt, [place] Work [phone]

## 2019-05-31 NOTE — Procedures (Signed)
Patient Name: Thomas Burch  MRN: [id number]  Epilepsy Attending: Lora Havens  Referring Physician/Provider: Dr Kerney Elbe Date: 05/31/2019 Duration: 22.11 mins  Patient history: 33yo M with h/o stroke who presented with ams. EEG to evaluate for seizure  Level of alertness: awake  AEDs during EEG study: None  Technical aspects: This EEG study was done with scalp electrodes positioned according to the 10-20 International system of electrode placement. Electrical activity was acquired at a sampling rate of 500Hz  and reviewed with a high frequency filter of 70Hz  and a low frequency filter of 1Hz . EEG data were recorded continuously and digitally stored.   DESCRIPTION:  The posterior dominant rhythm consists of 8-9Hz  activity of moderate voltage (25-35 uV) seen predominantly in posterior head regions, asymmetric decreased right and reactive to eye opening and eye closing. There is an excessive amount of 15 to 18 Hz, 2-3 uV beta activity with irregular morphology distributed symmetrically and diffusely. There is also continuous low amplitude 2-5Hz  theta-delta slowing in right hemisphere, maximal parieto-occipital. Hyperventilation and photic stimulation were not performed.   ABNORMALITY 1. Continuous slow, right hemisphere 2. Excessive beta, generalized  IMPRESSION: This study is suggestive of cortical dysfunction in right hemisphere, maximal parieto-occipital likely secondary o underlying stroke. No seizures or epileptiform discharges were seen throughout the recording.   The excessive beta activity seen in the background is most likely due to the effect of benzodiazepine and is a benign EEG pattern.  Aleshka Corney Barbra Sarks

## 2019-06-01 ENCOUNTER — Inpatient Hospital Stay (HOSPITAL_COMMUNITY): Payer: Commercial Managed Care - PPO

## 2019-06-01 DIAGNOSIS — I639 Cerebral infarction, unspecified: Secondary | ICD-10-CM

## 2019-06-01 DIAGNOSIS — I6340 Cerebral infarction due to embolism of unspecified cerebral artery: Secondary | ICD-10-CM

## 2019-06-01 LAB — GLUCOSE, CAPILLARY
Glucose-Capillary: 186 mg/dL — ABNORMAL HIGH (ref 70–99)
Glucose-Capillary: 158 mg/dL — ABNORMAL HIGH (ref 70–99)

## 2019-06-01 LAB — CBC
HCT: 44.2 % (ref 39.0–52.0)
Hemoglobin: 14.7 g/dL (ref 13.0–17.0)
MCH: 29.5 pg (ref 26.0–34.0)
MCHC: 33.3 g/dL (ref 30.0–36.0)
MCV: 88.8 fL (ref 80.0–100.0)
Platelets: 257 10*3/uL (ref 150–400)
RBC: 4.98 MIL/uL (ref 4.22–5.81)
RDW: 12.2 % (ref 11.5–15.5)
WBC: 13.3 10*3/uL — ABNORMAL HIGH (ref 4.0–10.5)
nRBC: 0 % (ref 0.0–0.2)

## 2019-06-01 LAB — BASIC METABOLIC PANEL
Anion gap: 9 (ref 5–15)
BUN: 9 mg/dL (ref 6–20)
CO2: 27 mmol/L (ref 22–32)
Calcium: 8.2 mg/dL — ABNORMAL LOW (ref 8.9–10.3)
Chloride: 102 mmol/L (ref 98–111)
Creatinine, Ser: 1.06 mg/dL (ref 0.61–1.24)
GFR calc Af Amer: >60 mL/min (ref >60)
GFR calc non Af Amer: >60 mL/min (ref >60)
Glucose, Bld: 172 mg/dL — ABNORMAL HIGH (ref 70–99)
Potassium: 3.1 mmol/L — ABNORMAL LOW (ref 3.5–5.1)
Sodium: 138 mmol/L (ref 135–145)

## 2019-06-01 MED ORDER — LISINOPRIL 40 MG PO TABS: 40.0000 mg | ORAL_TABLET | Freq: Every day | ORAL | 2 refills | Status: DC

## 2019-06-01 MED ORDER — METFORMIN HCL 500 MG PO TABS: 500.0000 mg | ORAL_TABLET | Freq: Two times a day (BID) | ORAL | Status: DC

## 2019-06-01 MED ORDER — EZETIMIBE 10 MG PO TABS: 10.0000 mg | ORAL_TABLET | Freq: Every day | ORAL | 0 refills | Status: DC

## 2019-06-01 MED ORDER — METFORMIN HCL 500 MG PO TABS: 500.0000 mg | ORAL_TABLET | Freq: Two times a day (BID) | ORAL | 2 refills | Status: DC

## 2019-06-01 MED ORDER — LEVETIRACETAM 500 MG PO TABS: 500.0000 mg | ORAL_TABLET | Freq: Two times a day (BID) | ORAL | 0 refills | Status: DC

## 2019-06-01 MED ORDER — ATORVASTATIN CALCIUM 80 MG PO TABS: 80.0000 mg | ORAL_TABLET | Freq: Every day | ORAL | 1 refills | Status: DC

## 2019-06-01 MED ORDER — POTASSIUM CHLORIDE CRYS ER 20 MEQ PO TBCR
40.0000 meq | EXTENDED_RELEASE_TABLET | Freq: Two times a day (BID) | ORAL | Status: DC
  Administered 2019-06-01: 40 meq via ORAL
  Filled 2019-06-01: qty 2

## 2019-06-01 MED ORDER — CLOPIDOGREL BISULFATE 75 MG PO TABS: 75.0000 mg | ORAL_TABLET | Freq: Every day | ORAL | 3 refills | Status: DC

## 2019-06-01 NOTE — Progress Notes (Signed)
Family Medicine Teaching Service Daily Progress Note Intern Pager: [phone]  Patient name: Thomas Burch Medical record number: [id number] Date of birth: 1986-08-25 Age: 33 y.o. Gender: male  Primary Care Provider: Shirley, [place], DO Consultants: Neurology Code Status: Full  Pt Overview and Major Events to Date:  9/23- admitted, CT head, EEG 9/24- MRI  9/25- MRA  Assessment and Plan: [name] is a 33 y.o. male presenting with AMS . PMH is significant for a past medical history of multiple CVAs (most recent in 10/2018), DMT2, HTN, HLD, OSA  Seizure 2/2 CVA. AMS resolved. No new seizure activity or delirium noted over night while on EEG. Workup has included - echo-55-60%, normal LV function with mild hypertrophy, mild impaired LV diastolic filling, normal RV function, normal MV structure with mild MVR.  -mra-negative stenosis or embolic source. 50% narrowing supraclinoid right ICA, moderate right P2 segment narrowing -eeg-continuous slow right hemisphere and excessive beta generalized -Doppler- ordered but not complete yet -keppra-started last night  F/u Neurology, recommendations appreciated  Seizure precautions  Neuro checks  Permissive hypertension with goal less than systolic 097, diastolic less than 353, until cleared by neurology  PT/OT  Continue ASA 325mg   Continue plavix 75mg  long-term  Tylenol PRN headache  Hypertension- 200/126 overnight. Most recent 161/102. Continue to allow elevated blood pressures if asymptomatic and gradually normalize over 5-7 days. Taking half of home amlodipine dose and would recommend increasing to 10mg  tomorrow. Continue to hold home lisinopril 40mg .  continue amlodipine 5 mg- increase to 10mg  tomorrow  Hold lisinopril for now  Diabetes type 2, uncontrolled- CBGs 172-186 overnight. Increased lantus to 20 yesterday. Would recommend transitioning that to morning time dosing as outpatient.   Restart metformin  today.  continue insulin glargine to 20u  Moderate sliding scale insulin with 3 times daily CBG checks/AC  AKI with proteinuria- resolved S/p IV fluid replenishment. Continue to encourage PO hydration.   Avoid nephrotoxic agents  F/u outpatient  Hypokalemia- 3.1 today, has been wnl this admission. Will replete as necessary.   4mEq KDUR BID x2 today  Leukocytosis with neutrophil predominance-improved Slightly elevated to 13.3 from 11.0 yesterday. Patient remains afebrile.  repeat CBC in a.m.  Monitor for signs of infection  Elevated troponin-resolved Patient had no chest pain or shortness of breath. - vitals per floor protocol   Hyperlipidemia- chronic/stable  Continue atorvastatin  OSA- chronic/stable  CPAP nightly  Depression and anxiety- chronic/stable On home escitalopram 10 mg and Atarax 10 mg twice daily as needed  Continue home medication  FEN/GI: Heart healthy diet/carb modified PPx: Lovenox  Disposition: pending neuro clearance. Will not be able to drive until seizure free x6 months  Subjective:  Patient feels much improved today. Endorses some tossing and turning overnight but no episodes of confusion. He is ready to go home.   Objective: Temp:  [98.1 F (36.7 C)-99.2 F (37.3 C)] 98.5 F (36.9 C) (09/26 0433) Pulse Rate:  [71-97] 92 (09/26 0507) Resp:  [14-20] 16 (09/26 0433) BP: (157-208)/(102-130) 161/102 (09/26 0433) SpO2:  [94 %-99 %] 99 % (09/26 0507) Weight:  [110.8 kg] 110.8 kg (09/26 0425) Physical Exam: General: alert, lying comfortably in bed, NAD Cardiovascular: Regular rate and rhythm, no murmur/rub/gallop Respiratory: Clear to auscultation bilaterally, normal effort of breathing Abdomen: Soft, nontender, nondistended, normal bowel sounds present Extremities: No edema Neuro: CNs II through XII intact, PERRLA, EOMI, bilateral upper extremities strength 5/5 and sensation 5/5, AO x4  Laboratory: Recent Labs  Lab  05/30/19 0009 05/31/19  1610 06/01/19 0211  WBC 17.7* 11.0* 13.3*  HGB 15.6 15.5 14.7  HCT 45.0 46.7 44.2  PLT 290 267 257   Recent Labs  Lab 05/29/19 1557  05/30/19 0009 05/31/19 0907 06/01/19 0211  NA 134*  --  136 138 138  K 4.5  --  4.1 4.0 3.1*  CL 97*  --  99 102 102  CO2 15*  --  BUN 11  --  CREATININE 1.48*   < > 1.30* 1.19 1.06  CALCIUM 8.9  --  8.4* 8.7* 8.2*  PROT 7.3  --   --   --   --   BILITOT 1.0  --   --   --   --   ALKPHOS 85  --   --   --   --   ALT 41  --   --   --   --   AST 31  --   --   --   --   GLUCOSE 396*  --  307* 276* 172*   < > = values in this interval not displayed.   EEG: 1. Continuous slow, right hemisphere 2. Excessive beta, generalized  IMPRESSION: This study is suggestive of cortical dysfunction in right hemisphere, maximal parieto-occipital. No seizures or epileptiform discharges were seen throughout the recording.   The excessive beta activity seen in the background is most likely due to the effect of benzodiazepine and is a benign EEG pattern.  Pending: LE doppler  Imaging/Diagnostic Tests: Ct Head Wo Contrast  Result Date: 05/29/2019 CLINICAL DATA:  Altered level consciousness (LOC), unexplained. EXAM: CT HEAD WITHOUT CONTRAST TECHNIQUE: Contiguous axial images were obtained from the base of the skull through the vertex without intravenous contrast. COMPARISON:  CT angiogram head/neck 10/22/2018, brain MRI 10/22/2018 FINDINGS: Brain: No evidence of acute intracranial hemorrhage. No demarcated cortical infarction. Redemonstrated chronic lacunar infarcts within the bilateral thalami. Additional known chronic infarcts within bilateral basal ganglia were better appreciated on brain MRI 10/22/2018. No evidence of intracranial mass. No midline shift or extra-axial fluid collection. Minimal patchy hypodensity within the cerebral white matter is nonspecific, but consistent chronic small vessel ischemic disease. Cerebral  volume is normal for age. Partially empty sella turcica. Vascular: No definite hyperdense vessel. Skull: No calvarial fracture. Sinuses/Orbits: Small inferior right maxillary sinus mucous retention cyst partially imaged. Mild scattered ethmoid sinus mucosal thickening. No significant effusion. IMPRESSION: No CT evidence of acute intracranial abnormality. Mild chronic small vessel ischemic disease. Redemonstrated chronic bilateral thalamic lacunar infarcts. Additional known chronic lacunar infarcts within the basal ganglia were better appreciated on brain MRI 10/22/2018. Right maxillary sinus mucous retention cyst. Electronically Signed   By: Jackey Loge   On: 05/29/2019 18:24   Mr Angio Head Wo Contrast  Result Date: 05/31/2019 CLINICAL DATA:  Stroke follow-up EXAM: MRA NECK WITHOUT AND WITH CONTRAST MRA HEAD WITHOUT CONTRAST TECHNIQUE: Multiplanar and multiecho pulse sequences of the neck were obtained without and with intravenous contrast. Angiographic images of the neck were obtained using MRA technique without and with intravenous contast.; Angiographic images of the Circle of Willis were obtained using MRA technique without intravenous contrast. CONTRAST:  10mL GADAVIST GADOBUTROL 1 MMOL/ML IV SOLN COMPARISON:  Brain MRI from yesterday FINDINGS: MRA NECK FINDINGS Intermittent motion artifact. Antegrade flow in the carotid and vertebral arteries on time-of-flight imaging. Postcontrast imaging shows the arch which is normal in appearance. There is 3 vessel branching. The cervical carotids are smooth and diffusely  patent. Both vertebral arteries are likewise smooth and widely patent to the dura. MRA HEAD FINDINGS Symmetric robust flow in the carotid siphons. 50% narrowing of the supraclinoid right ICA with smooth appearance. Mild right proximal M1 stenosis. Negative for branch occlusion. Mild atheromatous type irregularity of bilateral MCA branches. The vertebral, basilar, PICA, and superior cerebellar  arteries are symmetrically widely patent. Moderate right P2 segment narrowing. Negative for aneurysm or vascular malformation. IMPRESSION: Neck MRA: Negative.  No stenosis or embolic source identified. Intracranial MRA: 1. 50% narrowing of the supraclinoid right ICA. 2. Moderate right P2 segment narrowing. Electronically Signed   By: Marnee SpringJonathon  Watts M.D.   On: 05/31/2019 10:22   Mr Angio Neck W Wo Contrast  Result Date: 05/31/2019 CLINICAL DATA:  Stroke follow-up EXAM: MRA NECK WITHOUT AND WITH CONTRAST MRA HEAD WITHOUT CONTRAST TECHNIQUE: Multiplanar and multiecho pulse sequences of the neck were obtained without and with intravenous contrast. Angiographic images of the neck were obtained using MRA technique without and with intravenous contast.; Angiographic images of the Circle of Willis were obtained using MRA technique without intravenous contrast. CONTRAST:  10mL GADAVIST GADOBUTROL 1 MMOL/ML IV SOLN COMPARISON:  Brain MRI from yesterday FINDINGS: MRA NECK FINDINGS Intermittent motion artifact. Antegrade flow in the carotid and vertebral arteries on time-of-flight imaging. Postcontrast imaging shows the arch which is normal in appearance. There is 3 vessel branching. The cervical carotids are smooth and diffusely patent. Both vertebral arteries are likewise smooth and widely patent to the dura. MRA HEAD FINDINGS Symmetric robust flow in the carotid siphons. 50% narrowing of the supraclinoid right ICA with smooth appearance. Mild right proximal M1 stenosis. Negative for branch occlusion. Mild atheromatous type irregularity of bilateral MCA branches. The vertebral, basilar, PICA, and superior cerebellar arteries are symmetrically widely patent. Moderate right P2 segment narrowing. Negative for aneurysm or vascular malformation. IMPRESSION: Neck MRA: Negative.  No stenosis or embolic source identified. Intracranial MRA: 1. 50% narrowing of the supraclinoid right ICA. 2. Moderate right P2 segment narrowing.  Electronically Signed   By: Marnee SpringJonathon  Watts M.D.   On: 05/31/2019 10:22   Mr Laqueta JeanBrain W ZOWo Contrast  Result Date: 05/30/2019 CLINICAL DATA:  Seizure versus stroke.  Episode of unresponsiveness. EXAM: MRI HEAD WITHOUT AND WITH CONTRAST TECHNIQUE: Multiplanar, multiecho pulse sequences of the brain and surrounding structures were obtained without and with intravenous contrast. CONTRAST:  10mL GADAVIST GADOBUTROL 1 MMOL/ML IV SOLN COMPARISON:  Head CT 05/29/2019 and MRI 10/22/2018 FINDINGS: Multiple sequences are mildly to moderately motion degraded. Brain: Small acute infarcts involve cortex and white matter in the posterior right frontal lobe and right parietal lobe, MCA/watershed distribution. There is a small amount of associated petechial hemorrhage without malignant type hemorrhage. No intracranial mass effect or extra-axial fluid collection is identified. No abnormal enhancement is identified. Chronic lacunar infarcts are again noted in the thalami and basal ganglia/deep white matter. There is mild cerebral atrophy. The hippocampi are symmetric in size and signal within limitations of motion artifact on dedicated temporal lobe sequences. Vascular: Major intracranial vascular flow voids are preserved. Skull and upper cervical spine: Unremarkable bone marrow signal. Sinuses/Orbits: Unremarkable orbits. Small right maxillary sinus mucous retention cyst. No significant mastoid fluid. Other: None. IMPRESSION: 1. Small acute right frontal and parietal lobe infarcts with small volume petechial hemorrhage. 2. Chronic small vessel ischemia as above. Electronically Signed   By: Sebastian AcheAllen  Grady M.D.   On: 05/30/2019 17:15   Dg Chest Port 1 View  Result Date: 05/29/2019 CLINICAL DATA:  Pt states he laid down to take a nap because he didn't feel good and was woken up by EMS. Pt states he just started taking an antidepressant and thinks that may have caused this episode. No heart or lung diseases. HTN and diabetes.  Nonsmoker. EXAM: PORTABLE CHEST 1 VIEW COMPARISON:  Chest radiograph 04/29/2017 FINDINGS: Stable cardiomediastinal contours which are within normal limits. The lungs are clear. No pneumothorax or pleural effusion. No acute finding in the visualized skeleton. IMPRESSION: No evidence of active disease. Electronically Signed   By: Emmaline Kluver M.D.   On: 05/29/2019 16:36    Leeroy Bock, DO 06/01/2019, 8:37 AM PGY-2, [facility] FPTS Intern pager: [phone], text pages welcome

## 2019-06-01 NOTE — Progress Notes (Signed)
   06/01/19 1640  AVS Discharge Documentation  AVS Discharge Instructions Including Medications Provided to patient/caregiver  Name of Person Receiving AVS Discharge Instructions Including Medications Thomas Burch, patient; and Collene Gobble, friend and MPoA  Name of Clinician That Reviewed AVS Discharge Instructions Including Medications Octavio Graves, RN    I spent time discussing discharge instructions with patient and significant other. We discussed that patient had a stroke and seizure-like activity. We discussed that patient must not drive for six months. We discussed patient's medications at length, including new medications. Riddle very concerned about ASA and Plavix after stroke as she heard there was a "bleed" component. I discussed risk factors and MD instructions, as well as follow up with Neurology. Riddle frustrated as patient has had strokes before and she is very concerned about patient. I provided empathetic listening. We discussed recommendations for diet, importance of medication compliance, strategies for medication compliance, and follow up appointments. AVS printed and given to patient. I took patient down to main entrance and saw patient safely into private vehicle. Pt able to teach back about meds and follow up appointments. Pt in stable condition at discharge.

## 2019-06-01 NOTE — Progress Notes (Signed)
Abdomen measurement 106cm at belly button (41.7inches)

## 2019-06-01 NOTE — Procedures (Signed)
Patient Name:Thomas Burch UJW:[id number] Epilepsy Attending:Muntaha Vermette Barbra Sarks Referring Physician/Provider:Dr Kerney Elbe Duration: 05/31/2019 1619 to 06/01/2019 1319  Patient history:33yo M with h/o stroke who presented with ams. EEG to evaluate for seizure  Level of alertness:awake, asleep  AEDs during EEG study:None  Technical aspects: This EEG study was done with scalp electrodes positioned according to the 10-20 International system of electrode placement. Electrical activity was acquired at a sampling rate of 500Hz  and reviewed with a high frequency filter of 70Hz  and a low frequency filter of 1Hz . EEG data were recorded continuously and digitally stored.  DESCRIPTION:The posterior dominant rhythm consists of8-9Hz  activity of moderate voltage (25-35 uV) seen predominantly in posterior head regions,asymmetricdecreased rightand reactive to eye opening and eye closing. Sleep was characterized by vertex waves, sleep spindles (12-14Hz ), maximal frontocentral. There is an excessive amount of 15 to 18 Hz, 2-3 uV beta activity with irregular morphology distributed symmetrically and diffusely. There is also continuous low amplitude 2-5Hz  theta-delta slowing in right hemisphere, maximal parieto-occipital.Hyperventilation and photic stimulation were not performed.  ABNORMALITY 1. Continuous slow, right hemisphere 2. Excessive beta, generalized 3. Background asymmetry, decreased right  IMPRESSION: This study issuggestive of cortical dysfunction in right hemisphere, maximal parieto-occipital likely secondary to underlying stroke.No seizures or definite epileptiform discharges were seen throughout the recording.   The excessive beta activity seen in the background is most likely due to the effect of benzodiazepine and is a benign EEG pattern.   [place]

## 2019-06-01 NOTE — Progress Notes (Signed)
vLTM EEG complete. No skin breakdown 

## 2019-06-01 NOTE — Discharge Instructions (Signed)
You were admitted because you had a small stroke and possible seizure.  Because you have had multiple strokes before and you are only 32 years old, it is incredibly important for you to take your medications to prevent further strokes and damage to your brain in the future.  Please take atorvastatin 80 mg once daily, Plavix 75 mg once daily, aspirin 325 mg once daily, lisinopril and amlodipine daily for your blood pressure, and Toujeo for your diabetes control.  We are also starting 2 new medications.  We are starting Keppra twice daily to prevent further seizures and Zetia once daily to further control your high cholesterol.  Please see the neurologist and go to the [facility] family medicine center to follow-up and make sure that you are taking the right medications and still feeling better.  The appointment at the family medicine center has already been made for you, and you should call to schedule your neurology appointment.  We hope that you continue to feel better.  Per [facility] statutes, patients with seizures are not allowed to drive until  they have been seizure-free for six months. Use caution when using heavy equipment or power tools. Avoid working on ladders or at heights. Take showers instead of baths. Ensure the water temperature is not too high on the home water heater. Do not go swimming alone. When caring for infants or small children, sit down when holding, feeding, or changing them to minimize risk of injury to the child in the event you have a seizure.    Also, Maintain good sleep hygiene. Avoid alcohol.

## 2019-06-01 NOTE — Progress Notes (Signed)
Bilateral lower extremity venous duplex completed. Preliminary results in Chart review CV Proc. [facility], [place] 06/01/2019, 9:20 AM

## 2019-06-04 ENCOUNTER — Inpatient Hospital Stay: Payer: Commercial Managed Care - PPO | Admitting: Family Medicine

## 2019-06-04 ENCOUNTER — Other Ambulatory Visit: Payer: Self-pay

## 2019-06-04 ENCOUNTER — Ambulatory Visit (INDEPENDENT_AMBULATORY_CARE_PROVIDER_SITE_OTHER): Payer: Commercial Managed Care - PPO | Admitting: Family Medicine

## 2019-06-04 ENCOUNTER — Telehealth: Payer: Self-pay | Admitting: *Deleted

## 2019-06-04 VITALS — BP 142/104 | HR 104 | Wt 231.0 lb

## 2019-06-04 DIAGNOSIS — I10 Essential (primary) hypertension: Secondary | ICD-10-CM

## 2019-06-04 DIAGNOSIS — Z8673 Personal history of transient ischemic attack (TIA), and cerebral infarction without residual deficits: Secondary | ICD-10-CM | POA: Diagnosis not present

## 2019-06-04 DIAGNOSIS — Z09 Encounter for follow-up examination after completed treatment for conditions other than malignant neoplasm: Secondary | ICD-10-CM | POA: Diagnosis not present

## 2019-06-04 DIAGNOSIS — E1165 Type 2 diabetes mellitus with hyperglycemia: Secondary | ICD-10-CM | POA: Diagnosis not present

## 2019-06-04 MED ORDER — BLOOD GLUCOSE MONITOR KIT: PACK | 0 refills | Status: AC

## 2019-06-04 NOTE — Patient Instructions (Addendum)
It was wonderful to see you today.  I am glad that you are feeling so well, lets keep that up!   Things we discussed today: - For your diabetes:   -We are going to increase your metformin on 10/3 1000 mg in the morning and continue 500mg  at night.  If you continue to tolerate that well, after another week, increase the nighttime 1 1000 mg.  You may experience some GI upset with diarrhea, this will improve with time.  - I have printed a prescription for a glucometer and supplies.  I would like you to check your sugar in the morning before you eat breakfast, 2 hours after your largest meal, and then before bedtime.  Please keep a log of these numbers.   - For your blood pressure: I would like you to start taking your blood pressure at home every other day.  Please also keep a log of this.  If your blood pressure is consistently >180 on the top number or over >120 on the bottom number--you need to follow-up sooner than scheduled or go to the ED if you are also experiencing any visual changes, headache, feeling like you pass out, chest pain, shortness of breath, weakness/numbness.  Keep up the amazing work on your dietary changes.  Try to stay active while you are at work and see if he can get a group together to start walking during your lunch break.  I would like you to come back in approximately 2 weeks to check on your high blood pressure and diabetes, sooner if needed.  Please bring your log at that time

## 2019-06-04 NOTE — Telephone Encounter (Signed)
Pt is requesting a letter to return to work at the end of the week.  He is able to view and print from [place].  Will forward to provider who saw today.  Christen Bame, CMA

## 2019-06-04 NOTE — Progress Notes (Signed)
Subjective:    Patient ID: Thomas Burch, male    DOB: 04/22/1986, 33 y.o.   MRN: [id number]   CC: hospital f/u for AMS  HPI: [name] is a 33 year old gentleman with previous CVAs, hypertension, uncontrolled diabetes, and seizure-like activity presenting for hospital follow-up.  Hospital follow-up: Admitted from 9/23 to 9/26 for altered mental status, felt to be unclear, however suspected secondary to seizure activity and started on Keppra vs small acute ischemic areas within MRI brain that might have been contributory.  Today, he actually is doing quite well, states he is feeling really good.  He feels that this recent hospitalization was his "wake-up call "and has been early starting to make changes in his life towards a healthier lifestyle.    He cut significantly back on sodas, starting to drink more seltzer water.  Additionally he is trying to get a group of people at his work together to start walking at lunch breaks.  He is very interested in trying to get his diabetes and hypertension under better control.  Denies any further episodes of seizure-like activity, altered status, weakness, numbness since discharge from the hospital.  He has been taking all medications as prescribed (including the amlodipine 10 mg and Keppra), with exception of Lexapro.  States he took this for 1 day and did not like how it made him feel.  He also now does not feel like he needs it, denies feeling depressed or down, like a "light switch" went off in his mind and his mood has much improved since discharge.  Diabetes: He has been taking Toujeo 15 units, weekly Trulicity, and restarted metformin on hospital discharge.  Currently at 500 mg of metformin twice daily, tolerating well without any nausea, vomiting, GI upset, diarrhea.  Does not have a glucometer or any way to check his glucose at home.  Hypertension: Does have a blood pressure cuff at home for which he can check his blood pressure.  His amlodipine was  increased to 10 mg on hospital discharge.  Also taking lisinopril 40 mg.   Smoking status reviewed, non-smoker  Review of Systems Per HPI    Objective:  BP (!) 142/104   Pulse (!) 104   Wt 231 lb (104.8 kg)   SpO2 97%   BMI 33.15 kg/m  Vitals and nursing note reviewed  General: NAD, pleasant Cardiac: RRR, normal heart sounds, no murmurs Respiratory: CTAB, normal effort Abdomen: soft, nontender, nondistended Extremities: no edema or cyanosis. WWP. Skin: warm and dry, no rashes noted Neuro: alert and oriented, no focal deficits, CN II-XII intact.  EOMI, PERRLA.  5/5 upper and lower extremity strength.  Sensation to light touch intact throughout.  Speech appropriate, smile symmetrical.  Able to follow commands without concern. Psych: normal affect  Assessment & Plan:   Hospital discharge follow-up Follow-up s/p hospitalization for AMS felt to be secondary to seizure as late effect of CVA.  Doing quite well since discharge, currently taking Keppra with no further seizure-like episodes.  Spent majority of visit stressing importance of controlling his diabetes and hypertension as discussed below and going over lifestyle modifications.  Encouragingly, patient was very receptive to making these changes, congratulated him on his efforts thus far. - Continue Keppra 500 twice daily - Patient to call neurology office to schedule follow-up appointment within the next few weeks  Diabetes mellitus type II, uncontrolled ([place]), with obesity Uncontrolled, A1c 11.5.  Currently taking Toujeo, Trulicity, and metformin.  As he is tolerating metformin well thus  far, will stepwise increase to goal of 1000 mg twice daily.  Additionally, will send in new glucometer with supplies so that he can routinely start monitoring his glucose at home. - Continue Toujeo 15 units as is, will not increase for now until we have a realistic idea of his fasting glucose - Continue weekly Trulicity - Metformin, increase  by 500 mg every week for the next 2 weeks - Prescription printed for glucometer and supplies, patient to call if he has any difficulties with this - Start monitoring CBGs fasting/2 hours postprandial/bedtime and keep journal of this to bring in on follow-up visit  - Discussed increasing physical activity, dietary changes  Essential hypertension Uncontrolled.  Recently increased his amlodipine a few days ago.  Will start monitoring his blood pressure daily/every other day and keeping a journal of this to bring into the clinic.  If SBP persistently >180 or DBP >120, or has any associated headache, visual changes, pre-/syncopal, weakness/numbness-instructed to follow-up sooner than scheduled or present to ED. - Monitor BP at home, bring journal in on follow-up visit  History of CVA (cerebrovascular accident) without residual deficits Currently working towards risk reduction as discussed above.  Fortunately he is without any residual deficits. - Continue improvement of hypertension and diabetes as discussed above - Continue atorvastatin, Zetia, aspirin, Plavix - Patient to schedule follow-up with neurology   Follow-up in approximately 2 weeks for above, or sooner if needed.  Leticia Penna DO Family Medicine Resident PGY-2

## 2019-06-05 ENCOUNTER — Encounter: Payer: Self-pay | Admitting: Family Medicine

## 2019-06-05 DIAGNOSIS — Z8673 Personal history of transient ischemic attack (TIA), and cerebral infarction without residual deficits: Secondary | ICD-10-CM | POA: Insufficient documentation

## 2019-06-05 NOTE — Assessment & Plan Note (Addendum)
Follow-up s/p hospitalization for AMS felt to be secondary to seizure as late effect of CVA.  Doing quite well since discharge, currently taking Keppra with no further seizure-like episodes.  Spent majority of visit stressing importance of controlling his diabetes and hypertension as discussed below and going over lifestyle modifications.  Encouragingly, patient was very receptive to making these changes, congratulated him on his efforts thus far. - Continue Keppra 500 twice daily - Patient to call neurology office to schedule follow-up appointment within the next few weeks

## 2019-06-05 NOTE — Telephone Encounter (Signed)
Letter sent via [place]. Thank you!   Patriciaann Clan, DO

## 2019-06-05 NOTE — Assessment & Plan Note (Addendum)
Uncontrolled.  Recently increased his amlodipine a few days ago.  Will start monitoring his blood pressure daily/every other day and keeping a journal of this to bring into the clinic.  If SBP persistently >180 or DBP >120, or has any associated headache, visual changes, pre-/syncopal, weakness/numbness-instructed to follow-up sooner than scheduled or present to ED. - Monitor BP at home, bring journal in on follow-up visit

## 2019-06-05 NOTE — Assessment & Plan Note (Signed)
Uncontrolled, A1c 11.5.  Currently taking Toujeo, Trulicity, and metformin.  As he is tolerating metformin well thus far, will stepwise increase to goal of 1000 mg twice daily.  Additionally, will send in new glucometer with supplies so that he can routinely start monitoring his glucose at home. - Continue Toujeo 15 units as is, will not increase for now until we have a realistic idea of his fasting glucose - Continue weekly Trulicity - Metformin, increase by 500 mg every week for the next 2 weeks - Prescription printed for glucometer and supplies, patient to call if he has any difficulties with this - Start monitoring CBGs fasting/2 hours postprandial/bedtime and keep journal of this to bring in on follow-up visit  - Discussed increasing physical activity, dietary changes

## 2019-06-05 NOTE — Assessment & Plan Note (Signed)
Currently working towards risk reduction as discussed above.  Fortunately he is without any residual deficits. - Continue improvement of hypertension and diabetes as discussed above - Continue atorvastatin, Zetia, aspirin, Plavix - Patient to schedule follow-up with neurology

## 2019-06-17 ENCOUNTER — Other Ambulatory Visit: Payer: Self-pay | Admitting: *Deleted

## 2019-06-17 NOTE — Patient Outreach (Signed)
[facility]) Care Management  06/17/2019  Thomas Burch 03/29/1986 [id number]   EMMI- stroke d/c from [place] [facility] # 13 Date: Saturday 06/15/19 10 am  Red Alert Reason: Who reached Patient Went to follow-up appointment? No Scheduled a follow-up appointment? No   Insurance: united [employer] medicare   [facility] admissions x  1ED visits x 1 in the last 6 months  Last admission 05/29/19 -06/01/19 [place] felt to be secondary to seizure as late effect of CVA  Outreach attempt # 1 Patient is able to verify HIPAA, DOB and address [facility] Care Management RN reviewed and addressed red alert with patient   EMMI: Mr Thomas Burch reports he is doing well and has returned back to work He denies any medical concerns other than dealing with the guests at [employer] He and [facility] RN CM discussed [facility] and [facility] RN CM services if needed for "stress" He reports he will return a call prn. He agrees to a follow up call in 7-14 business days.  He confirms he did go to his hospital follow up appointment about 06/04/19 to [facility] and the automated EMMI system may have picked up on the answers incorrectly    Conditions: 05/29/19 AMS felt to be secondary to seizure as late effect of CVA HTN, CVA hx, seizures, AKI, morbid obesity, HLD  DME: eyeglasses, cbg monitor, bp cuff  Medications: he denies concerns with taking medications as prescribed, affording medications, side effects of medications and questions about medications    Advance Directives: has POA  Scanned in Epic   Consent: [facility] RN CM reviewed [facility] services with patient. Patient gave verbal consent for services [facility] telephonic RN CM.   Advised patient that there will be further automated EMMI- post discharge calls to assess how the patient is doing following the recent hospitalization Advised the patient that another call may be received from a nurse if any of their responses were abnormal. Patient voiced understanding and was  appreciative of f/u call.   Plan: [facility] RN CM will follow up with Mr Thomas Burch within 14-21 days to check on his status Pt encouraged to return a call to [place] CM prn  [facility] RN CM sent a successful outreach letter as discussed with [facility] brochure enclosed for review  Kimberly L. Lavina Hamman, RN, BSN, [place] Coordinator Office number [phone] Mobile number [phone]  Main [facility] number [phone] Fax number [phone]

## 2019-06-22 ENCOUNTER — Other Ambulatory Visit: Payer: Self-pay | Admitting: Family Medicine

## 2019-07-01 ENCOUNTER — Other Ambulatory Visit: Payer: Self-pay

## 2019-07-01 ENCOUNTER — Other Ambulatory Visit: Payer: Self-pay | Admitting: *Deleted

## 2019-07-01 NOTE — Patient Outreach (Signed)
  [facility]) Care Management  07/01/2019  Thomas Burch [birth year] [id number]   Follow up EMMI- stroke call for recent d/c from [facility]   Previous reported RED ON EMMI ALERT Day # 13 Date: Saturday 06/15/19 10 am  Red Alert Reason: Who reached Patient Went to follow-up appointment? No Scheduled a follow-up appointment? No  Follow up Stroke EMMI call   Thomas Burch is able to verify HIPAA  He statates he is doing well except continues to work multiple shifts per day at his job. He denies concerns with DME, Medications, his health and stress He confirms he has been to his follow up appointments He was reminded of the [facility] RN CM letter sent with the 24 hour nurse line number He confirms he will call if needed   [facility] RN CM will close case at this time as patient has been assessed and no needs identified/needs resolved.   Pt encouraged to return a call to [place] CM prn  [facility] RN CM discussed the outreach letter already sent on with [facility] brochure enclosed for review  Thomas Burch L. Lavina Hamman, RN, BSN, [place] Coordinator Office number [phone] Mobile number [phone]  Main [facility] number [phone] Fax number [phone]

## 2019-07-09 ENCOUNTER — Encounter: Payer: Self-pay | Admitting: Family Medicine

## 2019-07-11 ENCOUNTER — Encounter: Payer: Self-pay | Admitting: Family Medicine

## 2019-07-18 ENCOUNTER — Other Ambulatory Visit: Payer: Self-pay | Admitting: Family Medicine

## 2019-08-23 ENCOUNTER — Other Ambulatory Visit: Payer: Self-pay | Admitting: Family Medicine

## 2019-09-17 ENCOUNTER — Other Ambulatory Visit: Payer: Self-pay | Admitting: Family Medicine

## 2019-09-18 ENCOUNTER — Other Ambulatory Visit: Payer: Self-pay | Admitting: Family Medicine

## 2019-10-12 ENCOUNTER — Other Ambulatory Visit: Payer: Self-pay | Admitting: Family Medicine

## 2019-11-01 ENCOUNTER — Ambulatory Visit: Payer: Commercial Managed Care - PPO | Attending: Internal Medicine

## 2019-11-01 DIAGNOSIS — Z20822 Contact with and (suspected) exposure to covid-19: Secondary | ICD-10-CM

## 2019-11-02 LAB — NOVEL CORONAVIRUS, NAA: SARS-CoV-2, NAA: NOT DETECTED

## 2019-11-08 ENCOUNTER — Other Ambulatory Visit: Payer: Self-pay | Admitting: Family Medicine

## 2019-12-13 ENCOUNTER — Other Ambulatory Visit: Payer: Self-pay | Admitting: Family Medicine

## 2020-01-13 ENCOUNTER — Other Ambulatory Visit: Payer: Self-pay | Admitting: Family Medicine

## 2020-01-14 NOTE — Telephone Encounter (Signed)
Attempted to reach pt no answer. LVM for pt to call office to make f-up appt. Aquilla Solian, CMA

## 2020-01-15 NOTE — Telephone Encounter (Signed)
2nd time trying to reach pt to make f/up appt. No answer LVM for pt to call office to make f/up appt. Aquilla Solian, CMA

## 2020-02-12 ENCOUNTER — Other Ambulatory Visit: Payer: Self-pay | Admitting: Family Medicine

## 2020-02-12 NOTE — Telephone Encounter (Signed)
Patient needs an appointment prior to any more refills.

## 2020-02-12 NOTE — Telephone Encounter (Signed)
LM for patient ok per DPR that he would need a follow up appt prior to additional refills. Guyla Bless,CMA

## 2020-03-19 ENCOUNTER — Other Ambulatory Visit: Payer: Self-pay | Admitting: Family Medicine

## 2020-04-19 ENCOUNTER — Other Ambulatory Visit: Payer: Self-pay | Admitting: Family Medicine

## 2020-04-20 NOTE — Telephone Encounter (Signed)
Please call [name] and let him know that I will provide a 1 month refill for his Keppra but we will not provide any additional refills until he is seen in clinic.  This is an unusual medication to be filled at the family clinic and I want to make sure that he is connected to the appropriate specialists. Mirian Mo, MD

## 2020-05-08 ENCOUNTER — Emergency Department (HOSPITAL_COMMUNITY): Payer: BC Managed Care – PPO

## 2020-05-08 ENCOUNTER — Ambulatory Visit (HOSPITAL_COMMUNITY)
Admission: EM | Admit: 2020-05-08 | Discharge: 2020-05-08 | Disposition: A | Discharge status: Home / Self Care | Payer: BC Managed Care – PPO

## 2020-05-08 ENCOUNTER — Other Ambulatory Visit: Payer: Self-pay

## 2020-05-08 ENCOUNTER — Observation Stay (HOSPITAL_COMMUNITY)
Admission: EM | Admit: 2020-05-08 | Discharge: 2020-05-09 | Disposition: A | Discharge status: Home / Self Care | Payer: BC Managed Care – PPO | Attending: Family Medicine | Admitting: Family Medicine

## 2020-05-08 ENCOUNTER — Encounter (HOSPITAL_COMMUNITY): Payer: Self-pay | Admitting: Emergency Medicine

## 2020-05-08 ENCOUNTER — Encounter (HOSPITAL_COMMUNITY): Payer: Self-pay | Admitting: *Deleted

## 2020-05-08 DIAGNOSIS — Z6831 Body mass index (BMI) 31.0-31.9, adult: Secondary | ICD-10-CM | POA: Diagnosis not present

## 2020-05-08 DIAGNOSIS — E785 Hyperlipidemia, unspecified: Secondary | ICD-10-CM | POA: Insufficient documentation

## 2020-05-08 DIAGNOSIS — I10 Essential (primary) hypertension: Secondary | ICD-10-CM | POA: Insufficient documentation

## 2020-05-08 DIAGNOSIS — R1012 Left upper quadrant pain: Secondary | ICD-10-CM | POA: Diagnosis not present

## 2020-05-08 DIAGNOSIS — Z23 Encounter for immunization: Secondary | ICD-10-CM | POA: Insufficient documentation

## 2020-05-08 DIAGNOSIS — Z8673 Personal history of transient ischemic attack (TIA), and cerebral infarction without residual deficits: Secondary | ICD-10-CM | POA: Insufficient documentation

## 2020-05-08 DIAGNOSIS — Z794 Long term (current) use of insulin: Secondary | ICD-10-CM | POA: Diagnosis not present

## 2020-05-08 DIAGNOSIS — I160 Hypertensive urgency: Secondary | ICD-10-CM | POA: Diagnosis not present

## 2020-05-08 DIAGNOSIS — Z7982 Long term (current) use of aspirin: Secondary | ICD-10-CM | POA: Insufficient documentation

## 2020-05-08 DIAGNOSIS — R109 Unspecified abdominal pain: Secondary | ICD-10-CM | POA: Diagnosis present

## 2020-05-08 DIAGNOSIS — E119 Type 2 diabetes mellitus without complications: Secondary | ICD-10-CM | POA: Diagnosis not present

## 2020-05-08 DIAGNOSIS — Z20822 Contact with and (suspected) exposure to covid-19: Secondary | ICD-10-CM | POA: Diagnosis not present

## 2020-05-08 DIAGNOSIS — N179 Acute kidney failure, unspecified: Secondary | ICD-10-CM | POA: Diagnosis not present

## 2020-05-08 DIAGNOSIS — N3090 Cystitis, unspecified without hematuria: Secondary | ICD-10-CM | POA: Diagnosis not present

## 2020-05-08 DIAGNOSIS — R34 Anuria and oliguria: Secondary | ICD-10-CM

## 2020-05-08 DIAGNOSIS — R1032 Left lower quadrant pain: Secondary | ICD-10-CM

## 2020-05-08 DIAGNOSIS — N201 Calculus of ureter: Principal | ICD-10-CM | POA: Insufficient documentation

## 2020-05-08 DIAGNOSIS — R319 Hematuria, unspecified: Secondary | ICD-10-CM

## 2020-05-08 DIAGNOSIS — N219 Calculus of lower urinary tract, unspecified: Secondary | ICD-10-CM

## 2020-05-08 LAB — COMPREHENSIVE METABOLIC PANEL
ALT: 33 U/L (ref 0–44)
AST: 22 U/L (ref 15–41)
Albumin: 3.8 g/dL (ref 3.5–5.0)
Alkaline Phosphatase: 73 U/L (ref 38–126)
Anion gap: 13 (ref 5–15)
BUN: 24 mg/dL — ABNORMAL HIGH (ref 6–20)
CO2: 23 mmol/L (ref 22–32)
Calcium: 9.4 mg/dL (ref 8.9–10.3)
Chloride: 97 mmol/L — ABNORMAL LOW (ref 98–111)
Creatinine, Ser: 2.33 mg/dL — ABNORMAL HIGH (ref 0.61–1.24)
GFR calc Af Amer: 40 mL/min — ABNORMAL LOW (ref >60)
GFR calc non Af Amer: 35 mL/min — ABNORMAL LOW (ref >60)
Glucose, Bld: 536 mg/dL (ref 70–99)
Potassium: 4.4 mmol/L (ref 3.5–5.1)
Sodium: 133 mmol/L — ABNORMAL LOW (ref 135–145)
Total Bilirubin: 1.1 mg/dL (ref 0.3–1.2)
Total Protein: 7.3 g/dL (ref 6.5–8.1)

## 2020-05-08 LAB — CREATININE, SERUM
Creatinine, Ser: 2.24 mg/dL — ABNORMAL HIGH (ref 0.61–1.24)
GFR calc Af Amer: 42 mL/min — ABNORMAL LOW (ref >60)
GFR calc non Af Amer: 36 mL/min — ABNORMAL LOW (ref >60)

## 2020-05-08 LAB — CBC
HCT: 42.3 % (ref 39.0–52.0)
HCT: 40.9 % (ref 39.0–52.0)
Hemoglobin: 13.9 g/dL (ref 13.0–17.0)
Hemoglobin: 13.3 g/dL (ref 13.0–17.0)
MCH: 28.8 pg (ref 26.0–34.0)
MCH: 28.6 pg (ref 26.0–34.0)
MCHC: 32.9 g/dL (ref 30.0–36.0)
MCHC: 32.5 g/dL (ref 30.0–36.0)
MCV: 87.8 fL (ref 80.0–100.0)
MCV: 88 fL (ref 80.0–100.0)
Platelets: 354 10*3/uL (ref 150–400)
Platelets: 322 10*3/uL (ref 150–400)
RBC: 4.82 MIL/uL (ref 4.22–5.81)
RBC: 4.65 MIL/uL (ref 4.22–5.81)
RDW: 12.1 % (ref 11.5–15.5)
RDW: 12.1 % (ref 11.5–15.5)
WBC: 19.2 10*3/uL — ABNORMAL HIGH (ref 4.0–10.5)
WBC: 17.4 10*3/uL — ABNORMAL HIGH (ref 4.0–10.5)
nRBC: 0 % (ref 0.0–0.2)
nRBC: 0 % (ref 0.0–0.2)

## 2020-05-08 LAB — URINALYSIS, ROUTINE W REFLEX MICROSCOPIC
Bacteria, UA: NONE SEEN
Bilirubin Urine: NEGATIVE
Glucose, UA: >=500 mg/dL — AB
Ketones, ur: NEGATIVE mg/dL
Leukocytes,Ua: NEGATIVE
Nitrite: NEGATIVE
Protein, ur: 30 mg/dL — AB
Specific Gravity, Urine: 1.016 (ref 1.005–1.030)
pH: 5 (ref 5.0–8.0)

## 2020-05-08 LAB — CBG MONITORING, ED: Glucose-Capillary: 391 mg/dL — ABNORMAL HIGH (ref 70–99)

## 2020-05-08 LAB — HEMOGLOBIN A1C
Hgb A1c MFr Bld: 10.3 % — ABNORMAL HIGH (ref 4.8–5.6)
Mean Plasma Glucose: 248.91 mg/dL

## 2020-05-08 LAB — GLUCOSE, CAPILLARY: Glucose-Capillary: 353 mg/dL — ABNORMAL HIGH (ref 70–99)

## 2020-05-08 LAB — HIV ANTIBODY (ROUTINE TESTING W REFLEX): HIV Screen 4th Generation wRfx: NONREACTIVE

## 2020-05-08 LAB — SARS CORONAVIRUS 2 BY RT PCR (HOSPITAL ORDER, PERFORMED IN ~~LOC~~ HOSPITAL LAB): SARS Coronavirus 2: NEGATIVE

## 2020-05-08 LAB — LIPASE, BLOOD: Lipase: 32 U/L (ref 11–51)

## 2020-05-08 MED ORDER — LACTATED RINGERS IV SOLN: INTRAVENOUS | Status: DC

## 2020-05-08 MED ORDER — DEXTROSE IN LACTATED RINGERS 5 % IV SOLN: INTRAVENOUS | Status: DC

## 2020-05-08 MED ORDER — SODIUM CHLORIDE 0.9 % IV BOLUS
1000.0000 mL | Freq: Once | INTRAVENOUS | Status: AC
  Administered 2020-05-08: 1000 mL via INTRAVENOUS

## 2020-05-08 MED ORDER — ASPIRIN EC 81 MG PO TBEC
81.0000 mg | DELAYED_RELEASE_TABLET | Freq: Every day | ORAL | Status: DC
  Administered 2020-05-09: 81 mg via ORAL
  Filled 2020-05-08: qty 1

## 2020-05-08 MED ORDER — SODIUM CHLORIDE 0.9 % IV SOLN: INTRAVENOUS | Status: DC

## 2020-05-08 MED ORDER — ACETAMINOPHEN 325 MG PO TABS: 650.0000 mg | ORAL_TABLET | Freq: Four times a day (QID) | ORAL | Status: DC | PRN

## 2020-05-08 MED ORDER — LABETALOL HCL 5 MG/ML IV SOLN
10.0000 mg | Freq: Once | INTRAVENOUS | Status: AC
  Administered 2020-05-08: 10 mg via INTRAVENOUS
  Filled 2020-05-08: qty 4

## 2020-05-08 MED ORDER — DEXTROSE 50 % IV SOLN: 0.0000 mL | INTRAVENOUS | Status: DC | PRN

## 2020-05-08 MED ORDER — INSULIN REGULAR(HUMAN) IN NACL 100-0.9 UT/100ML-% IV SOLN: INTRAVENOUS | Status: DC

## 2020-05-08 MED ORDER — ATORVASTATIN CALCIUM 80 MG PO TABS: 80.0000 mg | ORAL_TABLET | Freq: Every day | ORAL | Status: DC

## 2020-05-08 MED ORDER — PNEUMOCOCCAL VAC POLYVALENT 25 MCG/0.5ML IJ INJ
0.5000 mL | INJECTION | INTRAMUSCULAR | Status: AC
  Administered 2020-05-09: 0.5 mL via INTRAMUSCULAR
  Filled 2020-05-08: qty 0.5

## 2020-05-08 MED ORDER — ACETAMINOPHEN 650 MG RE SUPP: 650.0000 mg | Freq: Four times a day (QID) | RECTAL | Status: DC | PRN

## 2020-05-08 MED ORDER — MORPHINE SULFATE (PF) 4 MG/ML IV SOLN
4.0000 mg | Freq: Once | INTRAVENOUS | Status: AC
  Administered 2020-05-08: 4 mg via INTRAVENOUS
  Filled 2020-05-08: qty 1

## 2020-05-08 MED ORDER — INSULIN ASPART 100 UNIT/ML ~~LOC~~ SOLN
10.0000 [IU] | Freq: Once | SUBCUTANEOUS | Status: AC
  Administered 2020-05-08: 10 [IU] via INTRAVENOUS

## 2020-05-08 MED ORDER — EZETIMIBE 10 MG PO TABS
10.0000 mg | ORAL_TABLET | Freq: Every day | ORAL | Status: DC
  Administered 2020-05-09: 10 mg via ORAL
  Filled 2020-05-08: qty 1

## 2020-05-08 MED ORDER — ONDANSETRON HCL 4 MG/2ML IJ SOLN
4.0000 mg | Freq: Once | INTRAMUSCULAR | Status: AC
  Administered 2020-05-08: 4 mg via INTRAVENOUS
  Filled 2020-05-08: qty 2

## 2020-05-08 MED ORDER — CLOPIDOGREL BISULFATE 75 MG PO TABS
75.0000 mg | ORAL_TABLET | Freq: Every day | ORAL | Status: DC
  Administered 2020-05-09: 75 mg via ORAL
  Filled 2020-05-08: qty 1

## 2020-05-08 MED ORDER — LEVETIRACETAM 500 MG PO TABS
500.0000 mg | ORAL_TABLET | Freq: Two times a day (BID) | ORAL | Status: DC
  Administered 2020-05-08 – 2020-05-09 (×2): 500 mg via ORAL
  Filled 2020-05-08 (×2): qty 1

## 2020-05-08 MED ORDER — TAMSULOSIN HCL 0.4 MG PO CAPS
0.4000 mg | ORAL_CAPSULE | Freq: Every day | ORAL | Status: DC
  Administered 2020-05-08 – 2020-05-09 (×2): 0.4 mg via ORAL
  Filled 2020-05-08 (×2): qty 1

## 2020-05-08 MED ORDER — INSULIN ASPART 100 UNIT/ML ~~LOC~~ SOLN
0.0000 [IU] | SUBCUTANEOUS | Status: DC
  Administered 2020-05-08: 15 [IU] via SUBCUTANEOUS
  Administered 2020-05-09 (×2): 8 [IU] via SUBCUTANEOUS
  Administered 2020-05-09: 2 [IU] via SUBCUTANEOUS

## 2020-05-08 MED ORDER — INSULIN GLARGINE 100 UNIT/ML ~~LOC~~ SOLN
15.0000 [IU] | Freq: Every day | SUBCUTANEOUS | Status: DC
  Administered 2020-05-09: 15 [IU] via SUBCUTANEOUS
  Filled 2020-05-08 (×2): qty 0.15

## 2020-05-08 MED ORDER — POLYETHYLENE GLYCOL 3350 17 G PO PACK: 17.0000 g | PACK | Freq: Every day | ORAL | Status: DC | PRN

## 2020-05-08 MED ORDER — SODIUM CHLORIDE 0.9 % IV BOLUS
1000.0000 mL | INTRAVENOUS | Status: AC
  Administered 2020-05-08: 1000 mL via INTRAVENOUS

## 2020-05-08 MED ORDER — AMLODIPINE BESYLATE 10 MG PO TABS
10.0000 mg | ORAL_TABLET | Freq: Every day | ORAL | Status: DC
  Administered 2020-05-09: 10 mg via ORAL
  Filled 2020-05-08: qty 1

## 2020-05-08 MED ORDER — ENOXAPARIN SODIUM 40 MG/0.4ML ~~LOC~~ SOLN
40.0000 mg | SUBCUTANEOUS | Status: DC
  Administered 2020-05-08: 40 mg via SUBCUTANEOUS
  Filled 2020-05-08: qty 0.4

## 2020-05-08 MED ORDER — SODIUM CHLORIDE 0.9 % IV SOLN
1.0000 g | INTRAVENOUS | Status: DC
  Administered 2020-05-08: 1 g via INTRAVENOUS
  Filled 2020-05-08: qty 10

## 2020-05-08 NOTE — Discharge Instructions (Addendum)
Go directly to the emergency department for evaluation of your extremely high blood pressure and acute abdominal pain.    BP 213/123  Acute abdominal Pain

## 2020-05-08 NOTE — ED Notes (Signed)
Attempted report x1. 

## 2020-05-08 NOTE — H&P (Addendum)
Family Medicine Teaching Service °Hospital Admission History and Physical °Service Pager: [phone] ° °Patient name: Jameis D Lattanzio Medical record number: [id number] °Date of birth: 07/09/1986 Age: 34 y.o. Gender: male ° °Primary Care Provider: Frank, Peter, MD °Consultants: Urology °Code Status: Full   °Preferred Emergency Contact: Mica Riddle [phone] ° °Chief Complaint: flank/side pain ° °Assessment and Plan: °Hodge D Mabus is a 34 y.o. male presenting with Left flank and inguinal pain. PMH is significant for uncontrolled diabetes, multiple CVA's, HTN, and HLD. ° °Nephrolithiasis   Complicated UTI   AKI   Sepsis °Patient presenting with left flank pain, urinary frequency with one episode of difficulty voiding prior to ED presentation, and dysuria. CT stone study demonstrated minimal left hydronephrosis, moderate left asymmetric perinephric stranding secondary to an obstructing 3 mm calculus seen protruding into the bladder lumen at the left ureterovesicular junction. Patient is afebrile with mild tachycardia and tachypnea, hypertension. Labs are notable for leukocytosis to 19, cr increased to 2.33 (bl approx 1) indicating acute kidney injury, very mild AG to 13, and marked hyperglycemia to 500s. Urinalysis notable for hemoglobin and glucose >500. Sepsis criteria met with tachycardia (mild to 102), tachypnea (mild to mid 20s), and leukocytosis. Most likely this infection is due to obstructive nephrolithiasis, possible UTI. CTX started, urine culture pending. Patient received IVF resuscitation with 2L LR in ED. Initially treated with morphine for pain. By time of our assessment for admision, patient's pain was markedly improved and he was comfortable without any pain, likely due to stone passing into bladder. Urology consulted and recommends flomax, continued IVF hydration, following renal function, NPO at midnight in case of procedure tomorrow if stone does not pass on its own. °- Admit to FPTS, med-surg,  attending Dr. Brown °- Appreciate Urology recommendations °- Start Ceftriaxone 1g q24 hours °- Start Flomax 0.4 mg daily °- Strain urine °- Strict I&O °- Carb modified diet °- Make NPO at midnight in case intervention needed tomorrow °- Lovenox for DVT ppx °- Up with assitance °- IVF NS @ 125mL/hr ° °DM Type 2 °Chronic and uncontrolled. Patient has had difficulty getting to appointments in the last year. BG on admission > 500, but do not suspect DKA with very mild AG to 13. Home medications include toujeo 15U qhs, metformin 500 mg BID. Will hold metformin due to AKI. Started on SSI, will give glargine 15U tonight. Will monitor closely. Last a1c 11.5% in 05/2019 °- SSI moderate °- Repeat Hgb A1c °- Glargine 15U qhs °- Follow CBG ° °HTN  °Chronic and uncontrolled. Patient's home medication is amlodipine 10 mg. In hospital, SBP ranging from 154-213, DBP 84-142. Patient reports home SBP ~ 160, expect this is elevated from baseline due to pain and AKI. Patient endorses HA, but likely due to above causes. Expect some element from pain. Patient denies vision changes, SOB, CP, cough, or symptoms of pulmonary edema. If persistently elevated >200, or patient has worsening or expanded sx, would treat with IV medication and wouldconsult PCCM for HTN emergencyPatient will need to be started on ACEI/ARB prior to discharge for DMT2, may require more than 2 agents for control. Goal BP <130/80. °-Continue Amlodipine 10 mg daily °-Continue to monitor ° °History of Multiple CVA's with Seizures °Patient has h/o multiple CVAs (cryptogenic, intracranial stenosis present). He reported to pharmacy technician that he is taking ASA and Plavix, but [facility] neurology note recommended to discontinue Plavix. Will continue for now and discuss further with patient. Patient reports compliance with keppra, no seizures. °-  Continue Aspirin 81 mg daily -Continue Clopidogrel 75 mg daily -Continue Keppra 500 mg BID  HLD Chronic and stable, home  medications as below. -Continue Ezetimibe 10 mg daily -Continue Lipitor 80 mg daily  FEN/GI: Carb Modified, NPO @ mn Prophylaxis: Enoxaparin  Disposition: Meg-Surg, Discharge in 1-2 days  History of Present Illness:  DAMARIO GILLIE is a 34 y.o. male presenting with 1 day of flank and side pain that started this morning at 4 AM. He noticed he had intermittent, sharp pain that comes and goes, and urinary frequency. This morning he had difficulty voiding, but subsequently has had to void 10+ times successfully. Patient had nausea, no vomiting, no fever or chills. No frank blood in urine.   ROS: Positive for- dysuria, frequency, nausea Negative for- Vomiting, fever, chills, gross hematuria   Review Of Systems: Per HPI with the following additions:  Review of Systems  Constitutional: Negative for fever.  Genitourinary: Positive for difficulty urinating, dysuria, flank pain and frequency.  All other systems reviewed and are negative.    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) ([place])    Diabetes mellitus without complication ([place])    AMS (altered mental status) 05/29/2019   Hospital discharge follow-up 10/26/2018   Aphasia    Snoring 12/04/2017   Expressive aphasia 05/12/2017   AKI (acute kidney injury) ([place]) 05/12/2017   H/O cerebral artery stenosis 05/12/2017   Diabetes mellitus type II, uncontrolled ([place]), with obesity    Hyperlipidemia    CVA (cerebral vascular accident) ([place]) 05/02/2017   Essential hypertension 02/01/2014   Morbid obesity ([place]) 02/01/2014    Past Medical History: Past Medical History:  Diagnosis Date   Cerebrovascular accident (CVA) due to thrombosis of cerebral artery ([place]) 05/11/2017   Diabetes mellitus type 2 with complications ([place])    Dysphasia 05/12/2017   Headache    Hypertension     Stroke ([place])    Infarct of left cingulate gyrus    Past Surgical History: Past Surgical History:  Procedure Laterality Date   TEE WITHOUT CARDIOVERSION N/A 05/04/2017   Procedure: TRANSESOPHAGEAL ECHOCARDIOGRAM (TEE);  Surgeon: Acie Fredrickson Wonda Cheng, MD;  [facility] END[facility]COPY;  Service: Cardiovascular;  Laterality: N/A;    Social History: Social History   Tobacco Use   Smoking status: Never Smoker   Smokeless tobacco: Never Used  Scientific laboratory technician Use: Never used  Substance Use Topics   Alcohol use: No    Comment: 1 drink per week   Drug use: No   Additional social history: Please also refer to relevant sections of EMR.  Family History: Family History  Problem Relation Age of Onset   Hypertension Father    Stroke Father    Hyperlipidemia Father    Diabetes Maternal Grandmother    Clotting disorder Neg Hx     Allergies and Medications: No Known Allergies No current facility-administered medications on file prior to encounter.   Current Outpatient Medications on File Prior to Encounter  Medication Sig Dispense Refill   amLODipine (NORVASC) 10 MG tablet Take 1 tablet (10 mg total) by mouth daily. 90 tablet 3   aspirin EC 81 MG tablet Take 81 mg by mouth daily. Swallow whole.     atorvastatin (LIPITOR) 80 MG tablet Take 1 tablet (80 mg total) by mouth daily at 6 PM. 90 tablet 1   Cholecalciferol (VITAMIN D-3)  25 MCG (1000 UT) CAPS Take 1,000 Units by mouth daily.    °• ezetimibe (ZETIA) 10 MG tablet TAKE 1 TABLET BY MOUTH EVERY DAY (Patient taking differently: Take 10 mg by mouth daily. ) 30 tablet 0  °• Insulin Glargine, 1 Unit Dial, (TOUJEO SOLOSTAR) 300 UNIT/ML SOPN Inject 15 Units into the skin at bedtime. 300 mL 5  °• levETIRAcetam (KEPPRA) 500 MG tablet TAKE 1 TABLET BY MOUTH TWICE A DAY (Patient taking differently: Take 500 mg by mouth 2 (two) times daily. ) 60 tablet 0  °• metFORMIN (GLUCOPHAGE) 500 MG tablet Take 1 tablet (500 mg total) by mouth 2  (two) times daily with a meal. 60 tablet 2  °• PRESCRIPTION MEDICATION See admin instructions. CPAP- At bedtime    °• aspirin 325 MG tablet Take 1 tablet (325 mg total) by mouth daily. (Patient not taking: Reported on 05/08/2020) 30 tablet 0  °• blood glucose meter kit and supplies KIT Dispense based on patient and insurance preference. Use up to four times daily as directed. (FOR ICD-9 250.00, 250.01). 1 each 0  °• clopidogrel (PLAVIX) 75 MG tablet Take 1 tablet (75 mg total) by mouth daily. 90 tablet 3  °• lisinopril (ZESTRIL) 40 MG tablet Take 1 tablet (40 mg total) by mouth daily. 30 tablet 2  °• [DISCONTINUED] ezetimibe (ZETIA) 10 MG tablet Take 1 tablet (10 mg total) by mouth daily. 30 tablet 0  °• [DISCONTINUED] ezetimibe (ZETIA) 10 MG tablet TAKE 1 TABLET BY MOUTH EVERY DAY 30 tablet 0  °• [DISCONTINUED] levETIRAcetam (KEPPRA) 500 MG tablet Take 1 tablet (500 mg total) by mouth 2 (two) times daily. 60 tablet 0  °• [DISCONTINUED] levETIRAcetam (KEPPRA) 500 MG tablet TAKE 1 TABLET BY MOUTH TWICE A DAY 60 tablet 0  ° ° °Objective: °BP (!) 155/93    Pulse 95    Temp 98.7 °F (37.1 °C) (Oral)    Resp 15    Ht 5' 10" (1.778 m)    Wt 99.8 kg    SpO2 95%    BMI 31.57 kg/m²  °Exam: °Physical Exam °Vitals and nursing note reviewed.  °Constitutional:   °   General: He is not in acute distress. °   Appearance: He is well-developed. He is obese. He is not ill-appearing or toxic-appearing.  °HENT:  °   Head: Normocephalic and atraumatic.  °Cardiovascular:  °   Rate and Rhythm: Normal rate and regular rhythm.  °   Pulses: Normal pulses.     °     Radial pulses are 2+ on the right side and 2+ on the left side.  °     Dorsalis pedis pulses are 2+ on the right side and 2+ on the left side.  °   Heart sounds: Normal heart sounds, S1 normal and S2 normal. No murmur heard.  ° °Pulmonary:  °   Effort: Pulmonary effort is normal. No respiratory distress.  °   Breath sounds: Normal breath sounds. No wheezing.  °Abdominal:  °    Tenderness: There is abdominal tenderness in the left lower quadrant. There is no right CVA tenderness, left CVA tenderness, guarding or rebound.  °Musculoskeletal:  °   Right lower leg: No edema.  °   Left lower leg: No edema.  °Neurological:  °   Mental Status: He is alert. Mental status is at baseline.  °Psychiatric:     °   Mood and Affect: Mood normal.     °   Behavior:   Behavior normal.     °   Thought Content: Thought content normal.  ° ° ° ° °Labs and Imaging: °CBC BMET  °Recent Labs  °Lab 05/08/20 °0954  °WBC 19.2*  °HGB 13.9  °HCT 42.3  °PLT 354  ° Recent Labs  °Lab 05/08/20 °0954  °NA 133*  °K 4.4  °CL 97*  °CO2 23  °BUN 24*  °CREATININE 2.33*  °GLUCOSE 536*  °CALCIUM 9.4  °  ° ° °EKG: My own interpretation (not copied from electronic read) Sinus Tachy. ° °CT Renal Stone Study: Minimal left hydronephrosis and moderate left asymmetric perinephric stranding secondary to an obstructing 3 mm calculus seen protruding into the bladder lumen at the left ureterovesicular junction. ° ° °Pashayan, Alexander S, MD °05/08/2020, 6:41 PM °PGY-1, [facility] °FPTS Intern pager: [phone], text pages welcome ° °FPTS Upper-Level Resident Addendum °  °I have independently interviewed and examined the patient. I have discussed the above with the original author and agree with their documentation. My edits for correction/addition/clarification are in pink. Please see also any attending notes.  ° °Caitlin Mahoney, M.D. °PGY-2, [facility] °05/09/2020 12:00 AM  °FPTS Service pager: [phone] (text pages welcome through AMION) ° ° °

## 2020-05-08 NOTE — Progress Notes (Signed)
  FMTS Attending Brief Note: Terisa Starr, MD  Personal pager:  [phone] FPTS Service Pager:  [phone]   Patient seen at 1700. Patient of Dr. Cardell Peach at [facility].   34 year old history significant for obesity, poorly controlled type 2 diabetes on insulin, hypertension and multiple prior stroke (cryptogenic, intracranial stenosis present) presenting with abdominal pain and nausea likely due to obstructing urinary stone.  On my exam after his CT scan he has marked improvement in his pain.  He has had some difficulty attending appointments in the last year.  He was recently seen by stroke neurologist at [facility]. He denies chest pain, fevers, chills, difficulty breathing, dysuria or similar prior episode. He reports he was unable to void this AM but now has voided ~10 times.   PMH reviewed- also notable for possible seizure disorder on Keppra  PSH- None significant Family history- Stroke, HTN in father Socially- works as Copy, non-smoker   Labs notable for hyperglycemia, mild gap acidosis and acute kidney injury.  He also has a leukocytosis. Reviewed the CAT scan which shows 3 mm calculus at the ureterovesicular junction.  EKG NSR, T wave flattening in lateral leads, similar to prior  HEENT: Sclera anicteric. Dentition is moderate. Appears well hydrated. Neck: Supple Cardiac: Regular rate and rhythm. Normal S1/S2. No murmurs, rubs, or gallops appreciated. Lungs: Clear bilaterally to ascultation.  Abdomen: Distended, obese  Normoactive bowel sounds. No tenderness to deep or light palpation. No rebound or guarding.  Extremities: Warm, well perfused without edema.  Skin: Warm, dry Psych: Pleasant and appropriate   1. Acute kidney injury, normal baseline creatinine (1 year ago), likely in part due to obstruction from stone, also uncontrolled HTN and dehydration. Appreciate Urology consultation and care.  IV hydration, strict I/O, bladder scan q8h. 2. Sepsis, meeting criteria by WBC, tachycardia,  tachypnea, may be due to obstructive nephrolithiasis, certainly UTI possible. Will treat with Ceftriaxone, await cultures. Received 2 liters of fluid in ER.  3. Type 2 diabetes with hyperglycemia, will obtain A1C, 15 units of glargine tonight, will need to optimize DM regimen (suspect he will have change in baseline creatinine). Sliding scale with meals, hold home oral medications.  4. Markedly elevated blood pressures, goal <130/80 or even lower given diabetes, multiple prior strokes. Patient reports home values ~160 systolic, no pulmonary edema, severe headache. Suspect AKI secondary to above listed multiple causes, not acute indication for HD as he is voiding. Telemetry monitoring overnight, treat with IV only if symptoms (and would discuss with CCM as this would then be considered HTN emergency). Continue home amlodipine. 5. History of multiple prior strokes, will confirm with patient if still on P2Y12 inhibitor (per [facility] notes, not to continue).  Will sign resident note as it is available.

## 2020-05-08 NOTE — ED Triage Notes (Signed)
Pt presents to [facility] for assessment of left flank pain waking him up this mroning at 4am.  Patient states he is unable to urinate by a few drops at a time, all beginning this morning.

## 2020-05-08 NOTE — ED Provider Notes (Signed)
[place]    CSN: [id number] Arrival date & time: 05/08/20  3151      History   Chief Complaint Chief Complaint  Patient presents with  . Flank Pain    HPI Thomas Burch is a 34 y.o. male.   Patient presents with acute left-sided abdominal pain since early this morning.  He reports he is unable to produce urine other than a few drops.  He denies fever, vomiting, diarrhea.  Patient's medical history is significant for diabetes, CVA, AKI, seizures.  The history is provided by the patient.    Past Medical History:  Diagnosis Date  . Cerebrovascular accident (CVA) due to thrombosis of cerebral artery ([place]) 05/11/2017  . Diabetes mellitus type 2 with complications ([place])   . Dysphasia 05/12/2017  . Headache   . Hypertension   . Stroke ([place])    Infarct of left cingulate gyrus    Patient Active Problem List   Diagnosis Date Noted  . History of CVA (cerebrovascular accident) without residual deficits 06/05/2019  . Seizure as late effect of cerebrovascular accident (CVA) ([place])   . Diabetes mellitus without complication ([place])   . AMS (altered mental status) 05/29/2019  . Hospital discharge follow-up 10/26/2018  . Aphasia   . Snoring 12/04/2017  . Expressive aphasia 05/12/2017  . AKI (acute kidney injury) ([place]) 05/12/2017  . H/O cerebral artery stenosis 05/12/2017  . Diabetes mellitus type II, uncontrolled ([place]), with obesity   . Hyperlipidemia   . CVA (cerebral vascular accident) ([place]) 05/02/2017  . Essential hypertension 02/01/2014  . Morbid obesity ([place]) 02/01/2014    Past Surgical History:  Procedure Laterality Date  . TEE WITHOUT CARDIOVERSION N/A 05/04/2017   Procedure: TRANSESOPHAGEAL ECHOCARDIOGRAM (TEE);  Surgeon: Acie Fredrickson Wonda Cheng, MD;  Location: [facility] ENDOSCOPY;  Service: Cardiovascular;  Laterality: N/A;       Home Medications    Prior to Admission medications   Medication Sig Start Date End Date Taking? Authorizing Provider  amLODipine (NORVASC)  10 MG tablet Take 1 tablet (10 mg total) by mouth daily. 12/04/17  Yes Rosalin Hawking, MD  aspirin 325 MG tablet Take 1 tablet (325 mg total) by mouth daily. 10/25/18  Yes Daisy Floro, DO  atorvastatin (LIPITOR) 80 MG tablet Take 1 tablet (80 mg total) by mouth daily at 6 PM. 06/01/19  Yes Winfrey, Alcario Drought, MD  Cholecalciferol (VITAMIN D-3) 25 MCG (1000 UT) CAPS Take 1,000 Units by mouth daily.   Yes [provider]  clopidogrel (PLAVIX) 75 MG tablet Take 1 tablet (75 mg total) by mouth daily. 06/01/19  Yes Kathrene Alu, MD  ezetimibe (ZETIA) 10 MG tablet TAKE 1 TABLET BY MOUTH EVERY DAY 04/20/20  Yes Matilde Haymaker, MD  Insulin Glargine, 1 Unit Dial, (TOUJEO SOLOSTAR) 300 UNIT/ML SOPN Inject 15 Units into the skin at bedtime. 10/24/18  Yes Milus Banister C, DO  levETIRAcetam (KEPPRA) 500 MG tablet TAKE 1 TABLET BY MOUTH TWICE A DAY 04/20/20  Yes Matilde Haymaker, MD  lisinopril (ZESTRIL) 40 MG tablet Take 1 tablet (40 mg total) by mouth daily. 06/01/19  Yes Winfrey, Alcario Drought, MD  metFORMIN (GLUCOPHAGE) 500 MG tablet Take 1 tablet (500 mg total) by mouth 2 (two) times daily with a meal. 06/01/19  Yes Winfrey, Alcario Drought, MD  blood glucose meter kit and supplies KIT Dispense based on patient and insurance preference. Use up to four times daily as directed. (FOR ICD-9 250.00, 250.01). 06/04/19   Patriciaann Clan, DO  escitalopram (  LEXAPRO) 10 MG tablet Take 10 mg by mouth at bedtime.  05/28/19   [provider]  hydrOXYzine (ATARAX/VISTARIL) 10 MG tablet Take 10 mg by mouth 2 (two) times daily as needed for anxiety.  05/28/19   [provider]  PRESCRIPTION MEDICATION See admin instructions. CPAP- At bedtime    [provider]  ezetimibe (ZETIA) 10 MG tablet Take 1 tablet (10 mg total) by mouth daily. 06/02/19   Kathrene Alu, MD  ezetimibe (ZETIA) 10 MG tablet TAKE 1 TABLET BY MOUTH EVERY DAY 02/12/20   Shirley, [place], DO  levETIRAcetam (KEPPRA) 500 MG tablet Take 1  tablet (500 mg total) by mouth 2 (two) times daily. 06/01/19   Kathrene Alu, MD  levETIRAcetam (KEPPRA) 500 MG tablet TAKE 1 TABLET BY MOUTH TWICE A DAY 02/12/20   Shirley, [place], DO    Family History Family History  Problem Relation Age of Onset  . Hypertension Father   . Stroke Father   . Hyperlipidemia Father   . Diabetes Maternal Grandmother   . Clotting disorder Neg Hx     Social History Social History   Tobacco Use  . Smoking status: Never Smoker  . Smokeless tobacco: Never Used  Vaping Use  . Vaping Use: Never used  Substance Use Topics  . Alcohol use: No    Comment: 1 drink per week  . Drug use: No     Allergies   Patient has no known allergies.   Review of Systems Review of Systems  Constitutional: Negative for chills and fever.  HENT: Negative for ear pain and sore throat.   Eyes: Negative for pain and visual disturbance.  Respiratory: Negative for cough and shortness of breath.   Cardiovascular: Negative for chest pain and palpitations.  Gastrointestinal: Positive for abdominal pain. Negative for diarrhea, nausea and vomiting.  Genitourinary: Positive for difficulty urinating. Negative for dysuria and hematuria.  Musculoskeletal: Negative for arthralgias and back pain.  Skin: Negative for color change and rash.  Neurological: Negative for seizures and syncope.  All other systems reviewed and are negative.    Physical Exam Triage Vital Signs ED Triage Vitals  Enc Vitals Group     BP      Pulse      Resp      Temp      Temp src      SpO2      Weight      Height      Head Circumference      Peak Flow      Pain Score      Pain Loc      Pain Edu?      Excl. in [place]?    No data found.  Updated Vital Signs BP (!) 213/123 (BP Location: Left Wrist)   Pulse 95   Temp 98.9 F (37.2 C) (Oral)   Resp 18   SpO2 97%   Visual Acuity Right Eye Distance:   Left Eye Distance:   Bilateral Distance:    Right Eye Near:   Left Eye Near:     Bilateral Near:     Physical Exam Vitals and nursing note reviewed.  Constitutional:      General: He is in acute distress.     Appearance: He is well-developed.  HENT:     Head: Normocephalic and atraumatic.     Mouth/Throat:     Mouth: Mucous membranes are moist.  Eyes:     Conjunctiva/sclera: Conjunctivae normal.  Cardiovascular:     Rate and Rhythm: Normal rate and regular rhythm.     Heart sounds: Normal heart sounds. No murmur heard.   Pulmonary:     Effort: Pulmonary effort is normal. No respiratory distress.     Breath sounds: Normal breath sounds.  Abdominal:     Palpations: Abdomen is soft.     Tenderness: There is abdominal tenderness in the left upper quadrant and left lower quadrant. There is no guarding or rebound.  Musculoskeletal:     Cervical back: Neck supple.  Skin:    General: Skin is warm and dry.  Neurological:     Mental Status: He is alert. Mental status is at baseline.  Psychiatric:        Mood and Affect: Mood normal.        Behavior: Behavior normal.      UC Treatments / Results  Labs (all labs ordered are listed, but only abnormal results are displayed) Labs Reviewed - No data to display  EKG   Radiology No results found.  Procedures Procedures (including critical care time)  Medications Ordered in UC Medications - No data to display  Initial Impression / Assessment and Plan / UC Course  I have reviewed the triage vital signs and the nursing notes.  Pertinent labs & imaging results that were available during my care of the patient were reviewed by me and considered in my medical decision making (see chart for details).   Hypertensive urgency.  Left upper and lower quadrant abdominal pain.  Decreased urine output.  Sending patient to the ED for evaluation.  Patient declines EMS and states his father will drive him there.  He states he feels stable to go without EMS.   Final Clinical Impressions(s) / UC Diagnoses   Final  diagnoses:  Hypertensive urgency  Left upper quadrant abdominal pain  Left lower quadrant abdominal pain  Decreased urine output     Discharge Instructions     Go directly to the emergency department for evaluation of your extremely high blood pressure and acute abdominal pain.    BP 213/123  Acute abdominal Pain       ED Prescriptions    None     PDMP not reviewed this encounter.   Sharion Balloon, NP 05/08/20 [phone]

## 2020-05-08 NOTE — ED Notes (Signed)
Patient is being discharged from the Urgent Care and sent to the Emergency Department via private vehicle. Per Wendee Beavers, APP, patient is in need of higher level of care due to severe abdominal, worsened with palpation, and a BP of 213/123. Patient is aware and verbalizes understanding of plan of care.  Vitals:   05/08/20 0930  BP: (!) 213/123  Pulse: 95  Resp: 18  Temp: 98.9 F (37.2 C)  SpO2: 97%

## 2020-05-08 NOTE — Consult Note (Signed)
H&P Physician requesting consult: Alvester Chou  Chief Complaint: Left ureteral calculus, AKI  History of Present Illness: 34 year old male presented with a 1 day history of left-sided flank pain.  For this reason he presented to the emergency department.  He was found to have a creatinine of 2.33 from baseline of around one.  He had a CT that revealed a 3 mm ureterovesicular junction calculus with mild upstream hydroureteronephrosis.  The patient states that his pain is much improved.  He has not seen a stone pass.  He has been voiding into a bedside urinal.  He denies previous stone history.  Past Medical History:  Diagnosis Date  . 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 Diabetes mellitus type 2 with complications (HCC)   . Dysphasia 05/12/2017  . Headache   . Hypertension   . Stroke (HCC)    Infarct of left cingulate gyrus   Past Surgical History:  Procedure Laterality Date  . TEE WITHOUT CARDIOVERSION N/A 05/04/2017   Procedure: TRANSESOPHAGEAL ECHOCARDIOGRAM (TEE);  Surgeon: Elease Hashimoto Deloris Ping, MD;  Location: [facility] ENDOSCOPY;  Service: Cardiovascular;  Laterality: N/A;    Home Medications:  (Not in a hospital admission)  Allergies: No Known Allergies  Family History  Problem Relation Age of Onset  . Hypertension Father   . Stroke Father   . Hyperlipidemia Father   . Diabetes Maternal Grandmother   . Clotting disorder Neg Hx    Social History:  reports that he has never smoked. He has never used smokeless tobacco. He reports that he does not drink alcohol and does not use drugs.  ROS: A complete review of systems was performed.  All systems are negative except for pertinent findings as noted. ROS   Physical Exam:  Vital signs in last 24 hours: Temp:  [98.7 F (37.1 C)-98.9 F (37.2 C)] 98.7 F (37.1 C) (09/03 0949) Pulse Rate:  [92-102] 92 (09/03 1500) Resp:  [14-24] 22 (09/03 1345) BP: (155-213)/(105-142) 155/105 (09/03  1500) SpO2:  [94 %-98 %] 94 % (09/03 1500) Weight:  [99.8 kg] 99.8 kg (09/03 0949) General:  Alert and oriented, No acute distress HEENT: Normocephalic, atraumatic Neck: No JVD or lymphadenopathy Cardiovascular: Regular rate and rhythm Lungs: Regular rate and effort Abdomen: Soft, nontender, nondistended, no abdominal masses Back: No CVA tenderness Extremities: No edema Neurologic: Grossly intact  Laboratory Data:  Results for orders placed or performed during the hospital encounter of 05/08/20 (from the past 24 hour(s))  Lipase, blood     Status: None   Collection Time: 05/08/20  9:54 AM  Result Value Ref Range   Lipase 32 11 - 51 U/L  Comprehensive metabolic panel     Status: Abnormal   Collection Time: 05/08/20  9:54 AM  Result Value Ref Range   Sodium 133 (L) 135 - 145 mmol/L   Potassium 4.4 3.5 - 5.1 mmol/L   Chloride 97 (L) 98 - 111 mmol/L   CO2 23 22 - 32 mmol/L   Glucose, Bld 536 (HH) 70 - 99 mg/dL   BUN 24 (H) 6 - 20 mg/dL   Creatinine, Ser 0.63 (H) 0.61 - 1.24 mg/dL   Calcium 9.4 8.9 - 01.6 mg/dL   Total Protein 7.3 6.5 - 8.1 g/dL   Albumin 3.8 3.5 - 5.0 g/dL   AST 22 15 - 41 U/L   ALT 33 0 - 44 U/L   Alkaline Phosphatase 73 38 - 126 U/L   Total Bilirubin 1.1 0.3 -  1.2 mg/dL   GFR calc non Af Amer 35 (L) >60 mL/min   GFR calc Af Amer 40 (L) >60 mL/min   Anion gap 13 5 - 15  CBC     Status: Abnormal   Collection Time: 05/08/20  9:54 AM  Result Value Ref Range   WBC 19.2 (H) 4.0 - 10.5 K/uL   RBC 4.82 4.22 - 5.81 MIL/uL   Hemoglobin 13.9 13.0 - 17.0 g/dL   HCT 22.4 39 - 52 %   MCV 87.8 80.0 - 100.0 fL   MCH 28.8 26.0 - 34.0 pg   MCHC 32.9 30.0 - 36.0 g/dL   RDW 82.5 00.3 - 70.4 %   Platelets 354 150 - 400 K/uL   nRBC 0.0 0.0 - 0.2 %  Urinalysis, Routine w reflex microscopic Urine, Clean Catch     Status: Abnormal   Collection Time: 05/08/20  9:56 AM  Result Value Ref Range   Color, Urine COLORLESS (A) YELLOW   APPearance CLOUDY (A) CLEAR   Specific  Gravity, Urine 1.016 1.005 - 1.030   pH 5.0 5.0 - 8.0   Glucose, UA >=500 (A) NEGATIVE mg/dL   Hgb urine dipstick MODERATE (A) NEGATIVE   Bilirubin Urine NEGATIVE NEGATIVE   Ketones, ur NEGATIVE NEGATIVE mg/dL   Protein, ur 30 (A) NEGATIVE mg/dL   Nitrite NEGATIVE NEGATIVE   Leukocytes,Ua NEGATIVE NEGATIVE   RBC / HPF 11-20 0 - 5 RBC/hpf   WBC, UA 0-5 0 - 5 WBC/hpf   Bacteria, UA NONE SEEN NONE SEEN   Mucus PRESENT    Uric Acid Crys, UA PRESENT   CBG monitoring, ED     Status: Abnormal   Collection Time: 05/08/20  5:22 PM  Result Value Ref Range   Glucose-Capillary 391 (H) 70 - 99 mg/dL   Comment 1 Notify RN    Comment 2 Document in Chart    No results found for this or any previous visit (from the past 240 hour(s)). Creatinine: Recent Labs    05/08/20 0954  CREATININE 2.33*    Impression/Assessment:  Left ureteral calculus Left hydronephrosis/ureteral obstruction secondary to hydronephrosis  Plan:  Start Flomax.  Continue IV hydration.  Follow renal function.  Strain all urine.  Hopefully he will pass the stone on his own.  Make him n.p.o. at midnight just in case he needs intervention for the ureteral calculus if his creatinine worsens.  Suspect we will be able to observe and hopefully he will be able to pass the stone.  Ray Church, III 05/08/2020, 6:13 PM

## 2020-05-08 NOTE — ED Triage Notes (Signed)
Pt reports onset this am of left side abd/flank pain. Has urinary urgency but only urinates small amount each time. Denies hx of kidney stones. Hypertensive, pt has hx of same.

## 2020-05-08 NOTE — ED Provider Notes (Signed)
Received handoff from Dr Myrtis Ser.  Briefly 34 yo male who is uncontrolled diabetic presenting to the ED with flank pain, hypertension, found to have significant hyperglycemia, BS in 500's, no anion gap, no ketones in urine, no evidence of DKA.  Cr 2.3.  UA without evidence of infection.  Pending CT renal stone study to evaluate for possible obstructing stones.  Patient given 1L IVF, zofran, morphine, and 10 mg labetalol with improvement of BP.  Anticipate admission for AKI and hyperglycemia, pain control for kidney stone.  CT with 3 mm stone at left UVF and mild hydronephrosis.  Stone appears to be likely to pass spontaneously, as he's only had 1 day of symptoms at this point.  I doubt that his mild hydro and small stone is the cause of his AKI.  I would defer urology consultation to inpatient services if they feel it is necessary at a later time.  For elevated glucose, 10 units IV insulin and a second liter  IVF NS was ordered.  Update 430 pm - pt reporting his pain is "a lot better" but still present.  5 pm - family practice attending came down and said their team will admit the patient, as he formerly followed with their family resident.     Terald Sleeper, MD 05/08/20 [phone]

## 2020-05-08 NOTE — ED Notes (Signed)
Patient transported to CT 

## 2020-05-08 NOTE — ED Provider Notes (Signed)
[name] EMERGENCY DEPARTMENT Provider Note   CSN: [id number] Arrival date & time: 05/08/20  4174     History Chief Complaint  Patient presents with  . Abdominal Pain  . Urinary Frequency    Thomas Burch is a 34 y.o. male.   Abdominal Pain Pain location:  L flank and LLQ Pain quality: aching   Pain radiates to:  Does not radiate Pain severity:  Moderate Onset quality:  Gradual Timing:  Constant Progression:  Partially resolved Chronicity:  New Relieved by:  Nothing Worsened by:  Nothing Ineffective treatments:  None tried Associated symptoms: dysuria and nausea   Associated symptoms: no chest pain, no chills, no cough, no diarrhea, no fever, no shortness of breath and no vomiting   Urinary Frequency Associated symptoms include abdominal pain. Pertinent negatives include no chest pain, no headaches and no shortness of breath.       Past Medical History:  Diagnosis Date  . Cerebrovascular accident (CVA) due to thrombosis of cerebral artery ([place]) 05/11/2017  . Diabetes mellitus type 2 with complications ([place])   . Dysphasia 05/12/2017  . Headache   . Hypertension   . Stroke ([place])    Infarct of left cingulate gyrus    Patient Active Problem List   Diagnosis Date Noted  . History of CVA (cerebrovascular accident) without residual deficits 06/05/2019  . Seizure as late effect of cerebrovascular accident (CVA) ([place])   . Diabetes mellitus without complication ([place])   . AMS (altered mental status) 05/29/2019  . Hospital discharge follow-up 10/26/2018  . Aphasia   . Snoring 12/04/2017  . Expressive aphasia 05/12/2017  . AKI (acute kidney injury) ([place]) 05/12/2017  . H/O cerebral artery stenosis 05/12/2017  . Diabetes mellitus type II, uncontrolled ([place]), with obesity   . Hyperlipidemia   . CVA (cerebral vascular accident) ([place]) 05/02/2017  . Essential hypertension 02/01/2014  . Morbid obesity ([place]) 02/01/2014    Past Surgical History:   Procedure Laterality Date  . TEE WITHOUT CARDIOVERSION N/A 05/04/2017   Procedure: TRANSESOPHAGEAL ECHOCARDIOGRAM (TEE);  Surgeon: Acie Fredrickson Wonda Cheng, MD;  Location: [facility] ENDOSCOPY;  Service: Cardiovascular;  Laterality: N/A;       Family History  Problem Relation Age of Onset  . Hypertension Father   . Stroke Father   . Hyperlipidemia Father   . Diabetes Maternal Grandmother   . Clotting disorder Neg Hx     Social History   Tobacco Use  . Smoking status: Never Smoker  . Smokeless tobacco: Never Used  Vaping Use  . Vaping Use: Never used  Substance Use Topics  . Alcohol use: No    Comment: 1 drink per week  . Drug use: No    Home Medications Prior to Admission medications   Medication Sig Start Date End Date Taking? Authorizing Provider  amLODipine (NORVASC) 10 MG tablet Take 1 tablet (10 mg total) by mouth daily. 12/04/17  Yes Rosalin Hawking, MD  aspirin EC 81 MG tablet Take 81 mg by mouth daily. Swallow whole.   Yes [provider]  atorvastatin (LIPITOR) 80 MG tablet Take 1 tablet (80 mg total) by mouth daily at 6 PM. 06/01/19  Yes Winfrey, Alcario Drought, MD  Cholecalciferol (VITAMIN D-3) 25 MCG (1000 UT) CAPS Take 1,000 Units by mouth daily.   Yes [provider]  ezetimibe (ZETIA) 10 MG tablet TAKE 1 TABLET BY MOUTH EVERY DAY Patient taking differently: Take 10 mg by mouth daily.  04/20/20  Yes Matilde Haymaker, MD  Insulin Glargine, 1 Unit Dial, (TOUJEO SOLOSTAR) 300 UNIT/ML SOPN Inject 15 Units into the skin at bedtime. 10/24/18  Yes Milus Banister C, DO  levETIRAcetam (KEPPRA) 500 MG tablet TAKE 1 TABLET BY MOUTH TWICE A DAY Patient taking differently: Take 500 mg by mouth 2 (two) times daily.  04/20/20  Yes Matilde Haymaker, MD  metFORMIN (GLUCOPHAGE) 500 MG tablet Take 1 tablet (500 mg total) by mouth 2 (two) times daily with a meal. 06/01/19  Yes Winfrey, Alcario Drought, MD  PRESCRIPTION MEDICATION See admin instructions. CPAP- At bedtime   Yes [provider]   aspirin 325 MG tablet Take 1 tablet (325 mg total) by mouth daily. Patient not taking: Reported on 05/08/2020 10/25/18   Milus Banister C, DO  blood glucose meter kit and supplies KIT Dispense based on patient and insurance preference. Use up to four times daily as directed. (FOR ICD-9 250.00, 250.01). 06/04/19   Patriciaann Clan, DO  clopidogrel (PLAVIX) 75 MG tablet Take 1 tablet (75 mg total) by mouth daily. 06/01/19   Kathrene Alu, MD  lisinopril (ZESTRIL) 40 MG tablet Take 1 tablet (40 mg total) by mouth daily. 06/01/19   Kathrene Alu, MD  ezetimibe (ZETIA) 10 MG tablet Take 1 tablet (10 mg total) by mouth daily. 06/02/19   Kathrene Alu, MD  ezetimibe (ZETIA) 10 MG tablet TAKE 1 TABLET BY MOUTH EVERY DAY 02/12/20   Shirley, [place], DO  levETIRAcetam (KEPPRA) 500 MG tablet Take 1 tablet (500 mg total) by mouth 2 (two) times daily. 06/01/19   Kathrene Alu, MD  levETIRAcetam (KEPPRA) 500 MG tablet TAKE 1 TABLET BY MOUTH TWICE A DAY 02/12/20   Shirley, [place], DO    Allergies    Patient has no known allergies.  Review of Systems   Review of Systems  Constitutional: Negative for chills and fever.  HENT: Negative for congestion and rhinorrhea.   Respiratory: Negative for cough and shortness of breath.   Cardiovascular: Negative for chest pain and palpitations.  Gastrointestinal: Positive for abdominal pain and nausea. Negative for diarrhea and vomiting.  Genitourinary: Positive for difficulty urinating, dysuria and frequency.  Musculoskeletal: Negative for arthralgias and back pain.  Skin: Negative for color change and rash.  Neurological: Negative for light-headedness and headaches.    Physical Exam Updated Vital Signs BP (!) 192/142 (BP Location: Left Arm)   Pulse 97   Temp 98.7 F (37.1 C) (Oral)   Resp 15   Ht 5' 10"  (1.778 m)   Wt 99.8 kg   SpO2 97%   BMI 31.57 kg/m   Physical Exam Vitals and nursing note reviewed. Exam conducted with a chaperone present.   Constitutional:      General: He is not in acute distress.    Appearance: Normal appearance.  HENT:     Head: Normocephalic and atraumatic.     Nose: No rhinorrhea.  Eyes:     General:        Right eye: No discharge.        Left eye: No discharge.     Conjunctiva/sclera: Conjunctivae normal.  Cardiovascular:     Rate and Rhythm: Normal rate and regular rhythm.  Pulmonary:     Effort: Pulmonary effort is normal.     Breath sounds: No stridor.  Abdominal:     General: Abdomen is flat. There is no distension.     Palpations: Abdomen is soft.  Genitourinary:    Penis: Normal.  Testes: Normal. Cremasteric reflex is present.        Right: Mass or tenderness not present.        Left: Mass or tenderness not present.  Musculoskeletal:        General: No deformity or signs of injury.  Skin:    General: Skin is warm and dry.  Neurological:     General: No focal deficit present.     Mental Status: He is alert. Mental status is at baseline.     Motor: No weakness.  Psychiatric:        Mood and Affect: Mood normal.        Behavior: Behavior normal.        Thought Content: Thought content normal.     ED Results / Procedures / Treatments   Labs (all labs ordered are listed, but only abnormal results are displayed) Labs Reviewed  COMPREHENSIVE METABOLIC PANEL - Abnormal; Notable for the following components:      Result Value   Sodium 133 (*)    Chloride 97 (*)    Glucose, Bld 536 (*)    BUN 24 (*)    Creatinine, Ser 2.33 (*)    GFR calc non Af Amer 35 (*)    GFR calc Af Amer 40 (*)    All other components within normal limits  CBC - Abnormal; Notable for the following components:   WBC 19.2 (*)    All other components within normal limits  URINALYSIS, ROUTINE W REFLEX MICROSCOPIC - Abnormal; Notable for the following components:   Color, Urine COLORLESS (*)    APPearance CLOUDY (*)    Glucose, UA >=500 (*)    Hgb urine dipstick MODERATE (*)    Protein, ur 30 (*)     All other components within normal limits  LIPASE, BLOOD    EKG EKG Interpretation  Date/Time:  Friday May 08 2020 12:43:32 EDT Ventricular Rate:  102 PR Interval:    QRS Duration: 101 QT Interval:  346 QTC Calculation: 451 R Axis:   -6 Text Interpretation: Sinus tachycardia Abnormal R-wave progression, early transition Nonspecific T abnormalities, lateral leads Confirmed by Dewaine Conger [phone]) on 05/08/2020 12:52:21 PM   Radiology No results found.  Procedures Procedures (including critical care time)  Medications Ordered in ED Medications  morphine 4 MG/ML injection 4 mg (4 mg Intravenous Given 05/08/20 1311)  ondansetron (ZOFRAN) injection 4 mg (4 mg Intravenous Given 05/08/20 1311)  sodium chloride 0.9 % bolus 1,000 mL (1,000 mLs Intravenous New Bag/Given 05/08/20 1310)  labetalol (NORMODYNE) injection 10 mg (10 mg Intravenous Given 05/08/20 1445)    ED Course  I have reviewed the triage vital signs and the nursing notes.  Pertinent labs & imaging results that were available during my care of the patient were reviewed by me and considered in my medical decision making (see chart for details).    MDM Rules/Calculators/A&P                          34 year old male comes in with left flank and lower abdominal pain.  Difficulty urinating.  Urinalysis reviewed by me shows blood with no signs of infection.  Creatinine is twice what it normally is at 2.3, other labs are fairly unremarkable.  Concern for obstructing stone.  He will get a CT scan he will get IV fluids IV pain control IV antiemetics.  He is hypertensive this is chronic for him, he takes medications including today  prior to coming.  He feels that it is high because of pain right now.  Patient is hyperglycemic but no signs of DKA, normal bicarb no anion gap.  He has no ketones in his urine.  The patient is hypertensive here, he told me to call his medications today, he told the pharmacy technician he has been out of his  lisinopril for several months likely uncontrolled chronic hypertension.  Patient's hypertension continues to be elevated with diastolics around 642X, labetalol will be given, with the acute kidney injury likely secondary to obstruction I do not want his kidneys to further be exacerbated by the hypertension.  Still waiting for the CT scan to confirm stone, if it is not stone this is acute kidney injury in the setting of uncontrolled hypertension.  Pt care was handed off to on coming provider at 1530.  Complete history and physical and current plan have been communicated.  Please refer to their note for the remainder of ED care and ultimate disposition.   Final Clinical Impression(s) / ED Diagnoses Final diagnoses:  AKI (acute kidney injury) ([place])  Hematuria, unspecified type  Uncontrolled hypertension    Rx / DC Orders ED Discharge Orders    None       Breck Coons, MD 05/08/20 1505

## 2020-05-08 NOTE — ED Notes (Signed)
Patient able to ambulate independently  

## 2020-05-09 DIAGNOSIS — R319 Hematuria, unspecified: Secondary | ICD-10-CM

## 2020-05-09 DIAGNOSIS — N219 Calculus of lower urinary tract, unspecified: Secondary | ICD-10-CM

## 2020-05-09 DIAGNOSIS — N179 Acute kidney failure, unspecified: Secondary | ICD-10-CM

## 2020-05-09 DIAGNOSIS — I10 Essential (primary) hypertension: Secondary | ICD-10-CM

## 2020-05-09 LAB — COMPREHENSIVE METABOLIC PANEL
ALT: 26 U/L (ref 0–44)
AST: 19 U/L (ref 15–41)
Albumin: 3.2 g/dL — ABNORMAL LOW (ref 3.5–5.0)
Alkaline Phosphatase: 61 U/L (ref 38–126)
Anion gap: 9 (ref 5–15)
BUN: 20 mg/dL (ref 6–20)
CO2: 24 mmol/L (ref 22–32)
Calcium: 8.9 mg/dL (ref 8.9–10.3)
Chloride: 107 mmol/L (ref 98–111)
Creatinine, Ser: 1.72 mg/dL — ABNORMAL HIGH (ref 0.61–1.24)
GFR calc Af Amer: 58 mL/min — ABNORMAL LOW (ref >60)
GFR calc non Af Amer: 50 mL/min — ABNORMAL LOW (ref >60)
Glucose, Bld: 148 mg/dL — ABNORMAL HIGH (ref 70–99)
Potassium: 3.6 mmol/L (ref 3.5–5.1)
Sodium: 140 mmol/L (ref 135–145)
Total Bilirubin: 0.7 mg/dL (ref 0.3–1.2)
Total Protein: 5.8 g/dL — ABNORMAL LOW (ref 6.5–8.1)

## 2020-05-09 LAB — GLUCOSE, CAPILLARY
Glucose-Capillary: 116 mg/dL — ABNORMAL HIGH (ref 70–99)
Glucose-Capillary: 144 mg/dL — ABNORMAL HIGH (ref 70–99)
Glucose-Capillary: 251 mg/dL — ABNORMAL HIGH (ref 70–99)
Glucose-Capillary: 294 mg/dL — ABNORMAL HIGH (ref 70–99)

## 2020-05-09 MED ORDER — CALCIUM CARBONATE ANTACID 500 MG PO CHEW
1.0000 | CHEWABLE_TABLET | Freq: Two times a day (BID) | ORAL | Status: DC
  Administered 2020-05-09: 200 mg via ORAL
  Filled 2020-05-09: qty 1

## 2020-05-09 MED ORDER — POTASSIUM CHLORIDE 20 MEQ PO PACK
40.0000 meq | PACK | Freq: Once | ORAL | Status: AC
  Administered 2020-05-09: 40 meq via ORAL
  Filled 2020-05-09: qty 2

## 2020-05-09 MED ORDER — CEPHALEXIN 500 MG PO CAPS: 500.0000 mg | ORAL_CAPSULE | Freq: Three times a day (TID) | ORAL | Status: DC

## 2020-05-09 MED ORDER — LEVETIRACETAM 500 MG PO TABS: 500.0000 mg | ORAL_TABLET | Freq: Two times a day (BID) | ORAL | 2 refills | Status: AC

## 2020-05-09 MED ORDER — TAMSULOSIN HCL 0.4 MG PO CAPS: 0.4000 mg | ORAL_CAPSULE | Freq: Every day | ORAL | 0 refills | Status: AC

## 2020-05-09 MED ORDER — AMLODIPINE BESYLATE 10 MG PO TABS
10.0000 mg | ORAL_TABLET | Freq: Every day | ORAL | 3 refills | Status: AC
Start: 2020-05-09 — End: ?

## 2020-05-09 MED ORDER — ATORVASTATIN CALCIUM 80 MG PO TABS: 80.0000 mg | ORAL_TABLET | Freq: Every day | ORAL | 3 refills | Status: DC

## 2020-05-09 MED ORDER — CONTOUR BLOOD GLUCOSE SYSTEM W/DEVICE KIT: PACK | 0 refills | Status: DC

## 2020-05-09 MED ORDER — CEPHALEXIN 500 MG PO CAPS: 500.0000 mg | ORAL_CAPSULE | Freq: Three times a day (TID) | ORAL | 0 refills | Status: AC

## 2020-05-09 MED ORDER — POLYETHYLENE GLYCOL 3350 17 G PO PACK: 17.0000 g | PACK | Freq: Every day | ORAL | 0 refills | Status: AC | PRN

## 2020-05-09 MED ORDER — ACETAMINOPHEN 325 MG PO TABS: 650.0000 mg | ORAL_TABLET | Freq: Four times a day (QID) | ORAL | 0 refills | Status: AC | PRN

## 2020-05-09 MED ORDER — EZETIMIBE 10 MG PO TABS: 10.0000 mg | ORAL_TABLET | Freq: Every day | ORAL | 3 refills | Status: AC

## 2020-05-09 MED ORDER — CLOPIDOGREL BISULFATE 75 MG PO TABS: 75.0000 mg | ORAL_TABLET | Freq: Every day | ORAL | 0 refills | Status: DC

## 2020-05-09 MED ORDER — CONTOUR NEXT TEST VI STRP: ORAL_STRIP | 12 refills | Status: DC

## 2020-05-09 MED ORDER — FREESTYLE LANCETS MISC: 5 refills | Status: AC

## 2020-05-09 NOTE — Discharge Instructions (Signed)
You were admitted for   - High blood pressure, which has improved. We did modify a few of your medications due to kidney function. We will likely re-start these as an outpatient  - You had a kidney stone--please strain your urine---we will send you home with a new medication for this  - You can use Tylenol for pain. Please DO NOT take Aleve, Advil, BC powder, Goodie powder, Motrin or other 'NSAIDs'--these can elevate your blood pressure and impact your kidneyes  - We will work with Dr. Homero Fellers on your diabetes medications  Please return if you have fevers, uncontrolled pain, or difficulty urinating  We started you on an antibiotic for a possible urine infection with your stone   You can call the [facility] at [phone] and ask to speak to the family medicine resident on call if you have concerns or questions.

## 2020-05-09 NOTE — Discharge Summary (Signed)
[facility] Discharge Summary  Patient name: Thomas Burch Medical record number: [id number] Date of birth: 05/17/1986 Age: 34 y.o. Gender: male Date of Admission: 05/08/2020  Date of Discharge: 05/09/20 Admitting Physician: Gladys Damme, MD  Primary Care Provider: Matilde Haymaker, MD Consultants: Urology  Indication for Hospitalization: Acute kidney injury   Discharge Diagnoses/Problem List:  Severely elevated blood pressures, improved Nephrolithiasis Type 2 Diabetes on insulin Suspected urinary tract infection   Disposition:Home   Discharge Condition: Improved and stable   Discharge Exam:   HEENT: Sclera anicteric. Dentition is moderate. Appears well hydrated. Neck: Supple Cardiac: Regular rate and rhythm. Normal S1/S2. No murmurs, rubs, or gallops appreciated. Lungs: Clear bilaterally to ascultation.  Abdomen: Normoactive bowel sounds. No tenderness to deep or light palpation. No rebound or guarding.  Extremities: Warm, well perfused without edema.  Skin: Warm, dry Psych: Pleasant and appropriate     Brief Hospital Course:   Nephrolithiasis  CT stone notable for 65m obstructing calculus located at left ureterovesical junction with minimal left hydronephrosis and moderate left asymmetric perinephric stranding. Urology consulted and recommended flomax, IV hydration, and urine straining. Overnight, pain improved. Patient had marked improvement in UOP.  - Strain urine, follow up with Urology as outpatient  Cystitis culture pending at time of discharge, treated with ceftriaxone then cephalexin  AKI, suspect baseline CKD but does not have frequent labs. UOP, electrolytes improved at discharge. Discharge creatinine 1.7, baseline appears to be around 1.1--1.3.  Lisinopril and metformin held on DC.   Chronic medical conditions  - Type 2 diabetes- does not regularly check BG. New glucometer and supplies to pharmacy. Suggest consultation with Dr.  KValentina Lucksand Dr. SJenne Campusin future. A1C is 10.3  - History of CVA- per [facility] notes, should be on ASA 81 mg only. Recommend follow up with Neurology as outpatient.  - HTN- restart lisinopril as able.    Issues for Follow Up:  1. Appears patient's neurologist has discontinued plavix however patient has continued this. Recommend further discussion and follow up with neurologist. Refills provided at discharge. 2. Repeat CMP to monitor creatine, potassium, and calcium.   3. Follow up urine culture. Discontinue cephalexin if no growth.  4. Consider GLP in future. Encourage patient to check BG prior to insulin administration   Significant Procedures: None  Significant Labs and Imaging:  Recent Labs  Lab 05/08/20 0954 05/08/20 2114  WBC 19.2* 17.4*  HGB 13.9 13.3  HCT 42.3 40.9  PLT 354 322   Recent Labs  Lab 05/08/20 0954 05/08/20 2114 05/09/20 0849  NA 133*  --  138  K 4.4  --  2.9*  CL 97*  --  110  CO2 23  --  23  GLUCOSE 536*  --  154*  BUN 24*  --  18  CREATININE 2.33* 2.24* 1.52*  CALCIUM 9.4  --  6.7*  ALKPHOS 73  --   --   AST 22  --   --   ALT 33  --   --   ALBUMIN 3.8  --   --    Lipase 32 Cr 2.33>2.24>1.52 WBC: 19.2>17.4 COVID neg HIV neg A1C: 10.3  Urinalysis    Component Value Date/Time   COLORURINE COLORLESS (A) 05/08/2020 0956   APPEARANCEUR CLOUDY (A) 05/08/2020 0956   LABSPEC 1.016 05/08/2020 0956   PHURINE 5.0 05/08/2020 0956   GLUCOSEU >=500 (A) 05/08/2020 0956   HGBUR MODERATE (A) 05/08/2020 [place] 05/08/2020 0956   KBenjamin Stain  NEGATIVE 05/08/2020 0956   PROTEINUR 30 (A) 05/08/2020 0956   NITRITE NEGATIVE 05/08/2020 0956   LEUKOCYTESUR NEGATIVE 05/08/2020 0956   CT Renal Stone Study  Result Date: 05/08/2020 CLINICAL DATA:  Left flank pain EXAM: CT ABDOMEN AND PELVIS WITHOUT CONTRAST TECHNIQUE: Multidetector CT imaging of the abdomen and pelvis was performed following the standard protocol without IV contrast. COMPARISON:   None. FINDINGS: Lower chest: No acute abnormality. Hepatobiliary: No focal liver abnormality is seen. No gallstones, gallbladder wall thickening, or biliary dilatation. Pancreas: Unremarkable Spleen: Unremarkable Adrenals/Urinary Tract: The adrenal glands are unremarkable. The kidneys are normal in size and position. There is minimal left hydronephrosis and moderate left asymmetric perinephric stranding secondary to an obstructing 3 mm calculus seen protruding into the bladder lumen at the left ureterovesicular junction. No additional nephro or urolithiasis. No hydronephrosis on the right. The bladder is unremarkable. Stomach/Bowel: The stomach, small bowel, and large bowel are unremarkable. Appendix normal. No free intraperitoneal gas or fluid. Vascular/Lymphatic: No pathologic adenopathy within the abdomen and pelvis. The abdominal vasculature is unremarkable. Reproductive: Prostate is unremarkable. Other: Rectum unremarkable. Tiny fat containing right inguinal hernia. Musculoskeletal: No acute or significant osseous findings. IMPRESSION: Minimal left hydronephrosis and moderate left asymmetric perinephric stranding secondary to an obstructing 3 mm calculus seen protruding into the bladder lumen at the left ureterovesicular junction. Electronically Signed   By: Fidela Salisbury MD   On: 05/08/2020 16:11   Results/Tests Pending at Time of Discharge: None   Discharge Medications:  Allergies as of 05/09/2020   No Known Allergies     Medication List    STOP taking these medications   lisinopril 40 MG tablet Commonly known as: ZESTRIL   metFORMIN 500 MG tablet Commonly known as: GLUCOPHAGE     TAKE these medications   acetaminophen 325 MG tablet Commonly known as: TYLENOL Take 2 tablets (650 mg total) by mouth every 6 (six) hours as needed for mild pain (or Fever >/= 101).   amLODipine 10 MG tablet Commonly known as: NORVASC Take 1 tablet (10 mg total) by mouth daily.   aspirin EC 81 MG  tablet Take 81 mg by mouth daily. Swallow whole. What changed: Another medication with the same name was removed. Continue taking this medication, and follow the directions you see here.   atorvastatin 80 MG tablet Commonly known as: LIPITOR Take 1 tablet (80 mg total) by mouth daily at 6 PM.   blood glucose meter kit and supplies Kit Dispense based on patient and insurance preference. Use up to four times daily as directed. (FOR ICD-9 250.00, 250.01).   cephALEXin 500 MG capsule Commonly known as: KEFLEX Take 1 capsule (500 mg total) by mouth every 8 (eight) hours for 6 days. Notes to patient: Start on evening of 9/4    clopidogrel 75 MG tablet Commonly known as: PLAVIX Take 1 tablet (75 mg total) by mouth daily.   Contour Blood Glucose System w/Device Kit Dispense 1 device Substitute with formulary preferred if needed   Contour Next Test test strip Generic drug: glucose blood Use as instructed   ezetimibe 10 MG tablet Commonly known as: ZETIA Take 1 tablet (10 mg total) by mouth daily.   freestyle lancets Use once daily to check blood sugar   insulin glargine (1 Unit Dial) 300 UNIT/ML Solostar Pen Commonly known as: Toujeo SoloStar Inject 15 Units into the skin at bedtime. Notes to patient: Check blood glucose prior to administering    levETIRAcetam 500 MG tablet Commonly known  as: KEPPRA Take 1 tablet (500 mg total) by mouth 2 (two) times daily. Notes to patient: Start evening of 9/4    polyethylene glycol 17 g packet Commonly known as: MIRALAX / GLYCOLAX Take 17 g by mouth daily as needed for mild constipation.   PRESCRIPTION MEDICATION See admin instructions. CPAP- At bedtime   tamsulosin 0.4 MG Caps capsule Commonly known as: FLOMAX Take 1 capsule (0.4 mg total) by mouth daily. Start taking on: May 10, 2020   Vitamin D-3 25 MCG (1000 UT) Caps Take 1,000 Units by mouth daily.       Discharge Instructions: Please refer to Patient Instructions  section of EMR for full details.  Patient was counseled important signs and symptoms that should prompt return to medical care, changes in medications, dietary instructions, activity restrictions, and follow up appointments.   Follow-Up Appointments:   Danna Hefty, DO 05/09/2020, 11:07 AM PGY-3, [place]

## 2020-05-09 NOTE — Progress Notes (Signed)
FMTS Attending Daily Note: [name], MD  Team Pager [phone] Pager [phone]  I have seen and examined this patient, reviewed their chart. I have discussed this patient with the resident. I agree with the resident's findings, assessment and care plan.   Status is: Observation  The patient remains OBS appropriate and will d/c before 2 midnights.  Dispo: The patient is from: Home              Anticipated d/c is to: Home              Anticipated d/c date is: 1 day              Patient currently is medically stable to d/c. pending PM labs (hypocalcemia and hypokalemia this AM)        Family Medicine Teaching Service Daily Progress Note Intern Pager: [phone]  Patient name: Thomas Burch Medical record number: [id number] Date of birth: November 16, 1985 Age: 34 y.o. Gender: male  Primary Care Provider: Matilde Haymaker, MD Consultants: Urology Code Status: FULL   Pt Overview and Major Events to Date:  9/3 - Admit to FPTS  Assessment and Plan: [name] is a 34 y.o. male presenting with Left flank and inguinal pain duet to nephrolithiasis and AKI. PMH is significant for uncontrolled diabetes, multiple CVA's, HTN, and HLD.  Nephrolithiasis  AKI  Complicated UTI CT stone notable for 45m obstructing calculus located at left ureterovesical junction with minimal left hydronephrosis and moderate left asymmetric perinephric stranding. Urology consulted and recommended flomax, IV hydration, and urine straining. Considered surgical intervention if worsening kidney function, however fortunately Cr improved from 2.33>2.24. Vitals have improved and normalized. No pain meds required overnight. - Follow up urine culture - Appreciate Urology consultation and care - Transition to PO antibiotics today  - urine straining  - strict I/O's  Hypokalemia and hypocalcemia, likely due to AKI. Both repleted - 3 PM labs, if improved, will discharge with close follow up on Tuesday 9/7  Sepsis,  improving Met 3/4 SIRS criteria for WBC, tachycardia, tachypnea. Expect 2/2 to obstructive nephrolithiasis and complicated UTI. Vitals have improved and currently normotensive, breathing comfortably on room air. S/p 2L NS in ED.   - given marked improvement, will transition to PO medications  - follo w up urine cultures  T2DM with hyperglycemia:  Chronic, uncontrolled. A1C 10.3. Home meds: Toujeo 15U qHS, meftormin 504mBID.  - Lantus 15U qHS - mSSI - carb modified diet  - hold home meds given AKI   HTN: Chronic, uncontrolled. Home meds: Amlodipine 1035mD. Elevated on admission, however improved this AM. BP improved this AM. - continue home Amlodipine 17m32m, hold lisinopril   HLD: Chronic, stable. Home meds: Lipitor 80mg86m Zetia 17mg 22m- continue Atorvastatin 80mg Q43mcontinue Zetia 17mg QD49mstory of CVA's: H/o multiple CVA's (cyrptogenic with intracranial stenosis present). Home meds: ASA, plavix?, and Keppra 500mg BID39mcontinue ASA at 81 mg, will continue clopidogrel for now (patient has been taking_ will need to discuss long term continuation. [facility] no indication for DAP[facility]  - Keppra 500mg BID 42m/GI: Carb consistent PPx: Lovenox  Disposition: Home today pending labs  Subjective:  Patient reports he feels well today. No chest pain, dyspnea, abdominal pain. Voided overnight.   Objective: Temp:  [97.7 F (36.5 C)-98.4 F (36.9 C)] 98.2 F (36.8 C) (09/04 0746) Pulse Rate:  [84-102] 84 (09/04 0746) Resp:  [13-24] 17 (09/04 0746) BP: (128-192)/(84-142) 145/100 (09/04  0746) SpO2:  [91 %-100 %] 97 % (09/04 0746) Physical Exam: General: Well appearing Cardiovascular: RRR, no murmurs/rubs/gallops Respiratory: Clear bilaterally to auscultation  Abdomen: Soft, no LLQ pain  Extremities: No edema   Laboratory: Recent Labs  Lab 05/08/20 0954 05/08/20 2114  WBC 19.2* 17.4*  HGB 13.9 13.3  HCT 42.3 40.9  PLT 354 322   Recent Labs  Lab  05/08/20 0954 05/08/20 2114 05/09/20 0849  NA 133*  --  138  K 4.4  --  2.9*  CL 97*  --  110  CO2 23  --  23  BUN 24*  --  18  CREATININE 2.33* 2.24* 1.52*  CALCIUM 9.4  --  6.7*  PROT 7.3  --   --   BILITOT 1.1  --   --   ALKPHOS 73  --   --   ALT 33  --   --   AST 22  --   --   GLUCOSE 536*  --  154*    Imaging/Diagnostic Tests: CT Renal Stone Study  Result Date: 05/08/2020 CLINICAL DATA:  Left flank pain EXAM: CT ABDOMEN AND PELVIS WITHOUT CONTRAST TECHNIQUE: Multidetector CT imaging of the abdomen and pelvis was performed following the standard protocol without IV contrast. COMPARISON:  None. FINDINGS: Lower chest: No acute abnormality. Hepatobiliary: No focal liver abnormality is seen. No gallstones, gallbladder wall thickening, or biliary dilatation. Pancreas: Unremarkable Spleen: Unremarkable Adrenals/Urinary Tract: The adrenal glands are unremarkable. The kidneys are normal in size and position. There is minimal left hydronephrosis and moderate left asymmetric perinephric stranding secondary to an obstructing 3 mm calculus seen protruding into the bladder lumen at the left ureterovesicular junction. No additional nephro or urolithiasis. No hydronephrosis on the right. The bladder is unremarkable. Stomach/Bowel: The stomach, small bowel, and large bowel are unremarkable. Appendix normal. No free intraperitoneal gas or fluid. Vascular/Lymphatic: No pathologic adenopathy within the abdomen and pelvis. The abdominal vasculature is unremarkable. Reproductive: Prostate is unremarkable. Other: Rectum unremarkable. Tiny fat containing right inguinal hernia. Musculoskeletal: No acute or significant osseous findings. IMPRESSION: Minimal left hydronephrosis and moderate left asymmetric perinephric stranding secondary to an obstructing 3 mm calculus seen protruding into the bladder lumen at the left ureterovesicular junction. Electronically Signed   By: Fidela Salisbury MD   On: 05/08/2020 16:11     Danna Hefty DO  PGY-3, [place] Intern pager: [phone], text pages welcome

## 2020-05-09 NOTE — Progress Notes (Signed)
Discharge summary provided to pt with instructions. Pt verbalized understanding of instructions. All questions and concerns were answerred. No complaints voiced. Pt alert/oriented in no apparent distress. D/C as ordered.

## 2020-05-09 NOTE — Plan of Care (Signed)
  Problem: Education: Goal: Knowledge of General Education information will improve Description: Including pain rating scale, medication(s)/side effects and non-pharmacologic comfort measures Outcome: Progressing   Problem: Clinical Measurements: Goal: Will remain free from infection Outcome: Progressing   

## 2020-05-09 NOTE — Progress Notes (Signed)
Urology Inpatient Progress Report  Uncontrolled hypertension [I10] AKI (acute kidney injury) (HCC) [N17.9] Hematuria, unspecified type [R31.9]        Intv/Subj: No acute events overnight. Patient is without complaint.  He is voiding into a urinal.  Has not seen a stone pass.  His pain has resolved.  He has 0 pain today.  Renal function was slightly better when it was rechecked last night.  Repeat this morning is pending.  Active Problems:   AKI (acute kidney injury) (HCC)  Current Facility-Administered Medications  Medication Dose Route Frequency Provider Last Rate Last Admin  . 0.9 %  sodium chloride infusion   Intravenous Continuous Shirlean Mylar, MD 125 mL/hr at 05/09/20 0544 New Bag at 05/09/20 0544  . acetaminophen (TYLENOL) tablet 650 mg  650 mg Oral Q6H PRN Shirlean Mylar, MD       Or  . acetaminophen (TYLENOL) suppository 650 mg  650 mg Rectal Q6H PRN Shirlean Mylar, MD      . amLODipine (NORVASC) tablet 10 mg  10 mg Oral Daily Shirlean Mylar, MD      . aspirin EC tablet 81 mg  81 mg Oral Daily Shirlean Mylar, MD      . atorvastatin (LIPITOR) tablet 80 mg  80 mg Oral q1800 Shirlean Mylar, MD      . cefTRIAXone (ROCEPHIN) 1 g in sodium chloride 0.9 % 100 mL IVPB  1 g Intravenous Q24H Shirlean Mylar, MD   Stopped at 05/08/20 2216  . clopidogrel (PLAVIX) tablet 75 mg  75 mg Oral Daily Shirlean Mylar, MD      . enoxaparin (LOVENOX) injection 40 mg  40 mg Subcutaneous Q24H Shirlean Mylar, MD   40 mg at 05/08/20 2146  . ezetimibe (ZETIA) tablet 10 mg  10 mg Oral Daily Shirlean Mylar, MD      . insulin aspart (novoLOG) injection 0-15 Units  0-15 Units Subcutaneous Q4H Shirlean Mylar, MD   8 Units at 05/09/20 0053  . insulin glargine (LANTUS) injection 15 Units  15 Units Subcutaneous QHS Shirlean Mylar, MD   15 Units at 05/09/20 0053  . levETIRAcetam (KEPPRA) tablet 500 mg  500 mg Oral BID Shirlean Mylar, MD   500 mg at 05/08/20 2146  . pneumococcal 23  valent vaccine (PNEUMOVAX-23) injection 0.5 mL  0.5 mL Intramuscular Tomorrow-1000 Terisa Starr M, MD      . polyethylene glycol (MIRALAX / GLYCOLAX) packet 17 g  17 g Oral Daily PRN Shirlean Mylar, MD      . tamsulosin [facility]) capsule 0.4 mg  0.4 mg Oral Daily Ray Church III, MD   0.4 mg at 05/08/20 2146     Objective: Vital: Vitals:   05/08/20 1845 05/08/20 2027 05/08/20 2107 05/09/20 0429  BP: (!) 154/84  (!) 162/95 128/86  Pulse: 96  98 84  Resp: 13  15 16   Temp:   98.4 F (36.9 C) 97.7 F (36.5 C)  TempSrc:   Oral Oral  SpO2: 94% 96% 100% 94%  Weight:      Height:       I/Os: I/O last 3 completed shifts: In: 1126 [I.V.:1033.8; IV Piggyback:92.2] Out: 500 [Urine:500]  Physical Exam:  General: Patient is in no apparent distress Lungs: Normal respiratory effort, chest expands symmetrically. GI: The abdomen is soft and nontender without mass. Ext: lower extremities symmetric  Lab Results: Recent Labs    05/08/20 0954 05/08/20 2114  WBC 19.2* 17.4*  HGB 13.9 13.3  HCT 42.3 40.9   Recent  Labs    05/08/20 0954 05/08/20 2114  NA 133*  --   K 4.4  --   CL 97*  --   CO2 23  --   GLUCOSE 536*  --   BUN 24*  --   CREATININE 2.33* 2.24*  CALCIUM 9.4  --    No results for input(s): LABPT, INR in the last 72 hours. No results for input(s): LABURIN in the last 72 hours. Results for orders placed or performed during the hospital encounter of 05/08/20  SARS Coronavirus 2 by RT PCR (hospital order, performed in [facility] hospital lab) Nasopharyngeal Nasopharyngeal Swab     Status: None   Collection Time: 05/08/20  5:24 PM   Specimen: Nasopharyngeal Swab  Result Value Ref Range Status   SARS Coronavirus 2 NEGATIVE NEGATIVE Final    Comment: (NOTE) SARS-CoV-2 target nucleic acids are NOT DETECTED.  The SARS-CoV-2 RNA is generally detectable in upper and lower respiratory specimens during the acute phase of infection. The lowest concentration of SARS-CoV-2  viral copies this assay can detect is 250 copies / mL. A negative result does not preclude SARS-CoV-2 infection and should not be used as the sole basis for treatment or other patient management decisions.  A negative result may occur with improper specimen collection / handling, submission of specimen other than nasopharyngeal swab, presence of viral mutation(s) within the areas targeted by this assay, and inadequate number of viral copies (<250 copies / mL). A negative result must be combined with clinical observations, patient history, and epidemiological information.  Fact Sheet for Patients:   BoilerBrush.com.cy  Fact Sheet for Healthcare Providers: https://pope.com/  This test is not yet approved or  cleared by the [place] FDA and has been authorized for detection and/or diagnosis of SARS-CoV-2 by FDA under an Emergency Use Authorization (EUA).  This EUA will remain in effect (meaning this test can be used) for the duration of the COVID-19 declaration under Section 564(b)(1) of the Act, 21 U.S.C. section 360bbb-3(b)(1), unless the authorization is terminated or revoked sooner.  Performed at [facility], [address] [place], [place] [address]     Studies/Results: CT Renal Stone Study  Result Date: 05/08/2020 CLINICAL DATA:  Left flank pain EXAM: CT ABDOMEN AND PELVIS WITHOUT CONTRAST TECHNIQUE: Multidetector CT imaging of the abdomen and pelvis was performed following the standard protocol without IV contrast. COMPARISON:  None. FINDINGS: Lower chest: No acute abnormality. Hepatobiliary: No focal liver abnormality is seen. No gallstones, gallbladder wall thickening, or biliary dilatation. Pancreas: Unremarkable Spleen: Unremarkable Adrenals/Urinary Tract: The adrenal glands are unremarkable. The kidneys are normal in size and position. There is minimal left hydronephrosis and moderate left asymmetric perinephric  stranding secondary to an obstructing 3 mm calculus seen protruding into the bladder lumen at the left ureterovesicular junction. No additional nephro or urolithiasis. No hydronephrosis on the right. The bladder is unremarkable. Stomach/Bowel: The stomach, small bowel, and large bowel are unremarkable. Appendix normal. No free intraperitoneal gas or fluid. Vascular/Lymphatic: No pathologic adenopathy within the abdomen and pelvis. The abdominal vasculature is unremarkable. Reproductive: Prostate is unremarkable. Other: Rectum unremarkable. Tiny fat containing right inguinal hernia. Musculoskeletal: No acute or significant osseous findings. IMPRESSION: Minimal left hydronephrosis and moderate left asymmetric perinephric stranding secondary to an obstructing 3 mm calculus seen protruding into the bladder lumen at the left ureterovesicular junction. Electronically Signed   By: Helyn Numbers MD   On: 05/08/2020 16:11    Assessment: Left ureteral calculus Ureteral obstruction  secondary to calculus Acute renal insufficiency  Plan: Continue to strain urine.  If his renal function is further improved today, can discharge from my standpoint with a strainer and daily Flomax.  Go home with Keflex given his leukocytosis but his urinalysis was negative for infection.  I do not think he needs to stay in the hospital for that.  Diabetes management per the primary team.  I will give him a diet today.   Modena Slater, MD Urology 05/09/2020, 7:20 AM

## 2020-05-10 LAB — BASIC METABOLIC PANEL
Anion gap: 5 (ref 5–15)
BUN: 18 mg/dL (ref 6–20)
CO2: 23 mmol/L (ref 22–32)
Calcium: 6.7 mg/dL — ABNORMAL LOW (ref 8.9–10.3)
Chloride: 110 mmol/L (ref 98–111)
Creatinine, Ser: 1.52 mg/dL — ABNORMAL HIGH (ref 0.61–1.24)
GFR calc Af Amer: >60 mL/min (ref >60)
GFR calc non Af Amer: 58 mL/min — ABNORMAL LOW (ref >60)
Glucose, Bld: 154 mg/dL — ABNORMAL HIGH (ref 70–99)
Potassium: 2.9 mmol/L — ABNORMAL LOW (ref 3.5–5.1)
Sodium: 138 mmol/L (ref 135–145)

## 2020-05-12 ENCOUNTER — Other Ambulatory Visit: Payer: Self-pay

## 2020-05-12 ENCOUNTER — Encounter: Payer: Self-pay | Admitting: Family Medicine

## 2020-05-12 ENCOUNTER — Ambulatory Visit (INDEPENDENT_AMBULATORY_CARE_PROVIDER_SITE_OTHER): Payer: BC Managed Care – PPO | Admitting: Family Medicine

## 2020-05-12 VITALS — BP 160/98 | HR 110 | Wt 231.0 lb

## 2020-05-12 DIAGNOSIS — N179 Acute kidney failure, unspecified: Secondary | ICD-10-CM

## 2020-05-12 DIAGNOSIS — I10 Essential (primary) hypertension: Secondary | ICD-10-CM | POA: Diagnosis not present

## 2020-05-12 DIAGNOSIS — N219 Calculus of lower urinary tract, unspecified: Secondary | ICD-10-CM

## 2020-05-12 NOTE — Assessment & Plan Note (Signed)
Assessment: Patient recently discharged from hospitalization due to kidney stone.  Patient states that his pain is dramatically improved and now has a small amount of achy pain every once in a while.  No sharp pains.  No blood in urine.  No fevers.  Patient's urine culture was not collected in the hospital prior to discharge and patient did not pick up his Keflex antibiotic. Plan: -Discussed with patient that as he has no symptoms and no urine culture evidence of infection as this was not collected he does not need to start his Keflex at this time. -We will check a BMP to monitor for kidney function as recommended in discharge summary

## 2020-05-12 NOTE — Patient Instructions (Signed)
It was great to see you!  Our plans for today:  -We are checking some labs today to look at your kidney function and electrolytes. - You do not need to take your keflex (the antibiotic) - I would like for you to check your blood pressure twice per day for the next 1 week and schedule a follow up appointment next week, ideally with your PCP Dr. Homero Fellers - If you see pressures greater than 180 or with a bottom number greater than 110 consistently then I recommend a same day appointment or go to the ER - If you see pressures in the 160s over 100s and start having symptoms such as blurry vision, bad headache, chest pain etc please go to the Emergency Room.  We are checking some labs today, I will call you if they are abnormal will send you a MyChart message or a letter if they are normal.  If you do not hear about your labs in the next 2 weeks please let us know.  Take care and seek immediate care sooner if you develop any concerns.   Dr. Day[facility]

## 2020-05-12 NOTE — Progress Notes (Signed)
    SUBJECTIVE:   CHIEF COMPLAINT / HPI:   Hospital follow-up-kidney stone Patient is a 34 year old male that presents today for hospital follow-up after having a kidney stone.  Patient currently on cephalexin with the plan to discontinue if urine culture was negative at follow-up appointment, unfortunately does not look like urine culture was ever collected.  Patient today states he is doing much better. Only has some dull pain on occasion. No blood in urine.  Hypertension: Patient states he has a history of white coat hypertension.  Currently managed on amlodipine 10 mg, lisinopril 40 mg/day.  States he does have a blood pressure cuff at home but does not check it very often.  States that he has checked it in recent past and seen the systolic numbers in the 130s, and that he has significantly high blood pressure in the doctor's office.  PERTINENT  PMH / PSH: Recent discharge after kidney stone  OBJECTIVE:   BP (!) 160/98   Pulse (!) 110   Wt 231 lb (104.8 kg)   SpO2 96%   BMI 33.15 kg/m    Pulse 94 on recheck  160/100 on recheck  General: NAD, pleasant, able to participate in exam Cardiac: RRR, no murmurs. Respiratory: CTAB, normal effort, No wheezes, rales or rhonchi Abdomen: Bowel sounds present, nontender, no CVA tenderness Neuro: alert, no obvious focal deficits Psych: Normal affect and mood  ASSESSMENT/PLAN:   Uncontrolled hypertension Assessment: Uncontrolled blood pressure with a stated history of whitecoat hypertension.  Blood pressure 160/98 today, 160/100 on recheck.  Patient states he took his blood pressure medications late today, right before coming into the appointment.  Blood pressure medications include amlodipine 10 mg, lisinopril 40 mg daily. Plan: -Discussed with patient his elevated blood pressures with recommendation to check his blood pressures twice per day at home with his home blood pressure cuff and record these numbers -Patient plans to make a  follow-up appointment with either myself or one of our other residents, ideally his primary care doctor next week and bring this log.  At that time we will consider additional blood pressure management if needed, particularly hydrochlorothiazide as the patient recently had a kidney stone and this can improve this -Provided return precautions as well as reasons to go to the emergency department such as greatly elevated blood pressure prior to the patient leaving  Calculus of lower urinary tract Assessment: Patient recently discharged from hospitalization due to kidney stone.  Patient states that his pain is dramatically improved and now has a small amount of achy pain every once in a while.  No sharp pains.  No blood in urine.  No fevers.  Patient's urine culture was not collected in the hospital prior to discharge and patient did not pick up his Keflex antibiotic. Plan: -Discussed with patient that as he has no symptoms and no urine culture evidence of infection as this was not collected he does not need to start his Keflex at this time. -We will check a BMP to monitor for kidney function as recommended in discharge summary     Jackelyn Poling, DO [facility]

## 2020-05-12 NOTE — Assessment & Plan Note (Signed)
Assessment: Uncontrolled blood pressure with a stated history of whitecoat hypertension.  Blood pressure 160/98 today, 160/100 on recheck.  Patient states he took his blood pressure medications late today, right before coming into the appointment.  Blood pressure medications include amlodipine 10 mg, lisinopril 40 mg daily. Plan: -Discussed with patient his elevated blood pressures with recommendation to check his blood pressures twice per day at home with his home blood pressure cuff and record these numbers -Patient plans to make a follow-up appointment with either myself or one of our other residents, ideally his primary care doctor next week and bring this log.  At that time we will consider additional blood pressure management if needed, particularly hydrochlorothiazide as the patient recently had a kidney stone and this can improve this -Provided return precautions as well as reasons to go to the emergency department such as greatly elevated blood pressure prior to the patient leaving

## 2020-05-13 LAB — BASIC METABOLIC PANEL
BUN/Creatinine Ratio: 15 (ref 9–20)
BUN: 21 mg/dL — ABNORMAL HIGH (ref 6–20)
CO2: 27 mmol/L (ref 20–29)
Calcium: 9.9 mg/dL (ref 8.7–10.2)
Chloride: 96 mmol/L (ref 96–106)
Creatinine, Ser: 1.4 mg/dL — ABNORMAL HIGH (ref 0.76–1.27)
GFR calc Af Amer: 74 mL/min/{1.73_m2} (ref >59)
GFR calc non Af Amer: 64 mL/min/{1.73_m2} (ref >59)
Glucose: 383 mg/dL — ABNORMAL HIGH (ref 65–99)
Potassium: 4.3 mmol/L (ref 3.5–5.2)
Sodium: 137 mmol/L (ref 134–144)

## 2020-05-14 ENCOUNTER — Encounter: Payer: Self-pay | Admitting: Family Medicine

## 2020-05-15 ENCOUNTER — Other Ambulatory Visit: Payer: Self-pay

## 2020-05-15 ENCOUNTER — Telehealth: Payer: Self-pay

## 2020-05-15 MED ORDER — CONTOUR NEXT TEST VI STRP
1.0000 | ORAL_STRIP | Freq: Four times a day (QID) | 12 refills | Status: DC
Start: 2020-05-15 — End: 2020-08-19

## 2020-05-15 MED ORDER — CONTOUR NEXT TEST VI STRP: ORAL_STRIP | 12 refills | Status: DC

## 2020-05-15 MED ORDER — MICROLET LANCETS MISC: 1.0000 | Freq: Four times a day (QID) | 6 refills | Status: AC

## 2020-05-15 MED ORDER — MICROLET NEXT LANCING DEVICE MISC: 1.0000 | 0 refills | Status: AC | PRN

## 2020-05-15 MED ORDER — CONTOUR MONITOR W/DEVICE KIT: 1.0000 | PACK | 0 refills | Status: DC | PRN

## 2020-05-15 NOTE — Telephone Encounter (Signed)
[facility] needs to know how often patient is testing? For insurance purpose.  For the  Contour next test strip.

## 2020-05-19 ENCOUNTER — Telehealth: Payer: Self-pay

## 2020-05-19 NOTE — Telephone Encounter (Addendum)
PCP reached out to me in hopes to provide freestyle libre to patient. If the patient is ok with Dexcom, I can send through [employer]. To my knowledge and pharmacy knowledge, freestyle Thomas Burch is only approved through medicare.

## 2020-05-20 NOTE — Telephone Encounter (Signed)
Either option is ok with me. I called and left a message on VM for him to call back.  Per our previous conversation, the dexcom should be fine.  I think he would be happy with whatever option is available.  Mirian Mo, MD

## 2020-06-02 ENCOUNTER — Other Ambulatory Visit: Payer: Self-pay | Admitting: Family Medicine

## 2020-06-02 NOTE — Telephone Encounter (Signed)
I tried calling the patient to see which one he preferred without success. Can we order him a Dexcom? I will be able to do the PA then.

## 2020-06-03 MED ORDER — DEXCOM G6 SENSOR MISC: 1.0000 | 11 refills | Status: AC

## 2020-06-03 MED ORDER — DEXCOM G6 TRANSMITTER MISC: 1.0000 | 3 refills | Status: AC

## 2020-06-03 NOTE — Telephone Encounter (Signed)
Spoke with PCP. Ok to send in [place] to start PA process.

## 2020-06-05 ENCOUNTER — Encounter: Payer: Self-pay | Admitting: Family Medicine

## 2020-07-13 ENCOUNTER — Other Ambulatory Visit: Payer: Self-pay

## 2020-07-20 ENCOUNTER — Ambulatory Visit: Payer: BC Managed Care – PPO | Admitting: Family Medicine

## 2020-08-05 ENCOUNTER — Other Ambulatory Visit: Payer: Self-pay

## 2020-08-05 MED ORDER — CLOPIDOGREL BISULFATE 75 MG PO TABS
75.0000 mg | ORAL_TABLET | Freq: Every day | ORAL | 3 refills | Status: DC
Start: 2020-08-05 — End: 2020-09-15

## 2020-08-07 ENCOUNTER — Ambulatory Visit: Payer: BC Managed Care – PPO | Admitting: Family Medicine

## 2020-08-14 ENCOUNTER — Ambulatory Visit: Payer: BC Managed Care – PPO | Admitting: Family Medicine

## 2020-08-19 ENCOUNTER — Ambulatory Visit: Payer: BC Managed Care – PPO | Admitting: Family Medicine

## 2020-08-19 ENCOUNTER — Other Ambulatory Visit: Payer: Self-pay

## 2020-08-19 VITALS — BP 148/96 | HR 107 | Ht 70.0 in | Wt 216.0 lb

## 2020-08-19 DIAGNOSIS — E1165 Type 2 diabetes mellitus with hyperglycemia: Secondary | ICD-10-CM

## 2020-08-19 DIAGNOSIS — Z23 Encounter for immunization: Secondary | ICD-10-CM | POA: Diagnosis not present

## 2020-08-19 DIAGNOSIS — I10 Essential (primary) hypertension: Secondary | ICD-10-CM

## 2020-08-19 LAB — POCT GLYCOSYLATED HEMOGLOBIN (HGB A1C): Hemoglobin A1C: 10.2 % — AB (ref 4.0–5.6)

## 2020-08-19 MED ORDER — CONTOUR MONITOR W/DEVICE KIT: 1.0000 | PACK | 0 refills | Status: AC | PRN

## 2020-08-19 MED ORDER — METFORMIN HCL ER 500 MG PO TB24: 1000.0000 mg | ORAL_TABLET | Freq: Two times a day (BID) | ORAL | 3 refills | Status: DC

## 2020-08-19 MED ORDER — HYDROCHLOROTHIAZIDE 12.5 MG PO TABS: 12.5000 mg | ORAL_TABLET | Freq: Every day | ORAL | 3 refills | Status: DC

## 2020-08-19 MED ORDER — CONTOUR NEXT TEST VI STRP: 1.0000 | ORAL_STRIP | Freq: Four times a day (QID) | 12 refills | Status: AC

## 2020-08-19 NOTE — Progress Notes (Signed)
    SUBJECTIVE:   CHIEF COMPLAINT / HPI:   Diabetes Current medication includes: -Metformin 500 mg BID -Lantus 15 units QHS Has been moving more at work 20,000 steps at night. Works as a Copy.  Eating chicken in place of red meat. Fresh fruit and veggies in place of canned food. Only occasional ice cream. He has not been checking his morning blood sugar because he recently lost his glucometer.  HTN He has a history of two previous CVAs. His current antihypertensive regimen includes: -Amlodipine 10 mg -lisinporil 40 mg   PERTINENT  PMH / PSH: Prior CVAs  OBJECTIVE:   BP (!) 148/96   Pulse (!) 107   Ht 5\' 10"  (1.778 m)   Wt 216 lb (98 kg)   SpO2 96%   BMI 30.99 kg/m    General: Alert and cooperative and appears to be in no acute distress HEENT: Moist mucous membranes. Cardio: Normal S1 and S2, no S3 or S4. Rhythm is regular. No murmurs or rubs.   Pulm: Clear to auscultation bilaterally, no crackles, wheezing, or diminished breath sounds. Normal respiratory effort Abdomen: Bowel sounds normal. Abdomen soft and non-tender.  Extremities: No peripheral edema. Warm/ well perfused.  Strong radial pulses. Neuro: Cranial nerves grossly intact  ASSESSMENT/PLAN:   Diabetes mellitus type II, uncontrolled (HCC), with obesity His A1c is 10.2 today roughly stable from his previous A1c of 10.33 months ago. He reports that he has been making an effort with lifestyle modification and has actually lost 15 pounds since his last visit. He was congratulated on his weight loss. Today we will focus on medication changes and close follow-up for additional medication. -Increase Metformin to milligrams twice daily -Increase Lantus to 20 mg daily in the morning -Check blood glucose daily before breakfast -Return to clinic in 1 month -Consider SGLT2 inhibitor follow-up for renal protection and cardiovascular protection  Uncontrolled hypertension Elevated blood pressure in clinic today.  Diastolic measurement was improved on repeat measurement. He was informed that his blood pressure remains poorly controlled and we need to start an additional medication today. We discussed that we should have more frequent visits for several months until his diabetes and hypertension has significantly improved. He is aware that both of these issues give him an increased risk of recurrent CVAs. -Continue amlodipine and lisinopril -Start HCTZ 12.5 mg daily -Follow-up BMP today -Repeat BMP in 1 week -Return to clinic in 1 month     , MD [facility]

## 2020-08-19 NOTE — Patient Instructions (Addendum)
It was great to see you today.  Here is a quick review of the things we talked about:   Diabetes:  - increase your metformin to 2 pills in the morning and 2 pills in the evening. - increase your insulin to 20 units in the monring - check your blood sugar every monring before breakfast.  Hypertension - start your new medicine HCTZ 12.5 daily - return to clinic in one week for blood work.   If all of your labs are normal, I will send you a message over my chart or send you a letter.  If there is anything to discuss, I will give you a phone call.    Therapy and Counseling Resources Most providers on this list will take Medicaid. Patients with commercial insurance or Medicare should contact their insurance company to get a list of in network providers.  [facility]:Psychiatry and Counseling [address] [place], [place] [address] [phone]  [facility]  [address], [address] [place], [place] [address]      [phone]  [facility] [address]  [place], [place] [address] [phone]  [facility] [address], [address]  [place], [place] [address]       [phone]      [facility]ov[facility]uss[facility] [address] [place], [place] [phone]  [facility]:  [address] [place] [phone]  [facility]:  [address] [place] [phone]  [facility] (under & uninsured) [address]   [place] [phone]    [email or web address]    [facility] [address] . [place]    [phone]  [facility] -[address]     Phone:  [phone]     [facility]  [address]  [phone]   [facility] [address]      [place], [place] [phone]  [facility]: [address] [place], [place], [place]   [phone]   [facility] (Spanish therapist) [email or web address]  [address] [place]  [address]   [place] [address]    [phone]    [facility]   [address],  [place], [place]  [phone]   [facility]  [address] [place]  [phone]  [facility]  [address] [place], [place]        [phone]  Open Access/Walk In Clinic under & uninsured  [facility]  [address] [place], [place] [phone] Crisis [phone]  [facility] [place],  (Spanish)   [address], [place]: [phone]) 8:30 - 12; 1 - 2:30  [facility] [employer],  [address] [place], [place]    ([phone]):8:30 - 12; 2 - 3PM  [facility] [employer],  [address]  [place]; [phone]):   Mon - Fri 8 AM - 5 PM  [facility] [address] [place]  MWF 12:30 to 3:00 or call to schedule an appointment  [phone]  Specific Provider options Psychology Today  [email or web address]/us 1. click on find a therapist  2. enter your zip code 3. left side and select or tailor a therapist for your specific need.   [facility] Provider Directory [email or web address]  (Medicaid)   Follow  all drop down to find a provider  Social Support program [facility] [place] [phone] or [employer] [address], [place], [place] Recovery support and educational   24- Hour Availability:  .  Marland Kitchen [facility]  . [address] [place], [place] [employer] [phone] Crisis [phone]  . [facility] [employer] [phone]  [facility] Crisis Service  [phone]   . [facility] [employer]  [phone] (after hours)  . Therapeutic Alternative/Mobile Crisis   [phone]  . [place] National Suicide Hotline   940-819-9439 (TALK)  . Call 911 or go to emergency room  . [employer]  [phone]);  [place] and [employer]   . [facility]  [phone]); [place], [place], [place], [place], [place], [place], [place]

## 2020-08-20 LAB — BASIC METABOLIC PANEL
BUN/Creatinine Ratio: 15 (ref 9–20)
BUN: 21 mg/dL — ABNORMAL HIGH (ref 6–20)
CO2: 25 mmol/L (ref 20–29)
Calcium: 9.9 mg/dL (ref 8.7–10.2)
Chloride: 100 mmol/L (ref 96–106)
Creatinine, Ser: 1.41 mg/dL — ABNORMAL HIGH (ref 0.76–1.27)
GFR calc Af Amer: 74 mL/min/{1.73_m2} (ref >59)
GFR calc non Af Amer: 64 mL/min/{1.73_m2} (ref >59)
Glucose: 274 mg/dL — ABNORMAL HIGH (ref 65–99)
Potassium: 4.5 mmol/L (ref 3.5–5.2)
Sodium: 139 mmol/L (ref 134–144)

## 2020-08-20 NOTE — Assessment & Plan Note (Signed)
His A1c is 10.2 today roughly stable from his previous A1c of 10.33 months ago. He reports that he has been making an effort with lifestyle modification and has actually lost 15 pounds since his last visit. He was congratulated on his weight loss. Today we will focus on medication changes and close follow-up for additional medication. -Increase Metformin to milligrams twice daily -Increase Lantus to 20 mg daily in the morning -Check blood glucose daily before breakfast -Return to clinic in 1 month -Consider SGLT2 inhibitor follow-up for renal protection and cardiovascular protection

## 2020-08-20 NOTE — Assessment & Plan Note (Signed)
Elevated blood pressure in clinic today. Diastolic measurement was improved on repeat measurement. He was informed that his blood pressure remains poorly controlled and we need to start an additional medication today. We discussed that we should have more frequent visits for several months until his diabetes and hypertension has significantly improved. He is aware that both of these issues give him an increased risk of recurrent CVAs. -Continue amlodipine and lisinopril -Start HCTZ 12.5 mg daily -Follow-up BMP today -Repeat BMP in 1 week -Return to clinic in 1 month

## 2020-08-26 ENCOUNTER — Other Ambulatory Visit: Payer: BC Managed Care – PPO

## 2020-08-26 ENCOUNTER — Ambulatory Visit: Payer: BC Managed Care – PPO

## 2020-08-26 ENCOUNTER — Other Ambulatory Visit: Payer: Self-pay

## 2020-08-26 VITALS — BP 136/92 | HR 107

## 2020-08-26 DIAGNOSIS — I10 Essential (primary) hypertension: Secondary | ICD-10-CM

## 2020-08-26 NOTE — Progress Notes (Signed)
Patient here today for BP check.      Last BP was on 08/19/2020 and was148/96.  BP today is 148/92 with a pulse of 107.    Checked BP in left arm with large cuff.    Symptoms present: None.   Recheck BP after 15 minutes 136/92.  Patient last took BP meds (Lisinonpril, Amlodipine, and HCTZ) this morning.   Patient does report he only started taking HCTZ yesterday.   Patient was taken to lab for BMP.

## 2020-08-27 LAB — BASIC METABOLIC PANEL
BUN/Creatinine Ratio: 17 (ref 9–20)
BUN: 22 mg/dL — ABNORMAL HIGH (ref 6–20)
CO2: 23 mmol/L (ref 20–29)
Calcium: 10.4 mg/dL — ABNORMAL HIGH (ref 8.7–10.2)
Chloride: 96 mmol/L (ref 96–106)
Creatinine, Ser: 1.33 mg/dL — ABNORMAL HIGH (ref 0.76–1.27)
GFR calc Af Amer: 79 mL/min/{1.73_m2} (ref >59)
GFR calc non Af Amer: 68 mL/min/{1.73_m2} (ref >59)
Glucose: 292 mg/dL — ABNORMAL HIGH (ref 65–99)
Potassium: 4.5 mmol/L (ref 3.5–5.2)
Sodium: 136 mmol/L (ref 134–144)

## 2020-09-15 ENCOUNTER — Other Ambulatory Visit: Payer: Self-pay

## 2020-09-15 ENCOUNTER — Ambulatory Visit (INDEPENDENT_AMBULATORY_CARE_PROVIDER_SITE_OTHER): Payer: Self-pay | Admitting: Family Medicine

## 2020-09-15 ENCOUNTER — Encounter: Payer: Self-pay | Admitting: Family Medicine

## 2020-09-15 VITALS — BP 158/100 | HR 104 | Ht 70.0 in | Wt 216.8 lb

## 2020-09-15 DIAGNOSIS — I10 Essential (primary) hypertension: Secondary | ICD-10-CM

## 2020-09-15 DIAGNOSIS — E1165 Type 2 diabetes mellitus with hyperglycemia: Secondary | ICD-10-CM

## 2020-09-15 MED ORDER — HYDROCHLOROTHIAZIDE 25 MG PO TABS: 25.0000 mg | ORAL_TABLET | Freq: Every day | ORAL | 3 refills | Status: AC

## 2020-09-15 MED ORDER — METFORMIN HCL ER 500 MG PO TB24: 1000.0000 mg | ORAL_TABLET | Freq: Two times a day (BID) | ORAL | 3 refills | Status: DC

## 2020-09-15 MED ORDER — CLOPIDOGREL BISULFATE 75 MG PO TABS: 75.0000 mg | ORAL_TABLET | Freq: Every day | ORAL | 3 refills | Status: AC

## 2020-09-15 MED ORDER — EMPAGLIFLOZIN 10 MG PO TABS: 10.0000 mg | ORAL_TABLET | Freq: Every day | ORAL | 1 refills | Status: DC

## 2020-09-15 MED ORDER — ATORVASTATIN CALCIUM 80 MG PO TABS: 80.0000 mg | ORAL_TABLET | Freq: Every day | ORAL | 3 refills | Status: AC

## 2020-09-15 MED ORDER — LISINOPRIL 40 MG PO TABS: 40.0000 mg | ORAL_TABLET | Freq: Every day | ORAL | 3 refills | Status: AC

## 2020-09-15 NOTE — Patient Instructions (Addendum)
Diabetes: Great job checking your blood glucose in the mornings.  I am glad that your numbers have been somewhat improved.  Remember, you can take 2 pills (1000 mg) of metformin in the morning and 2 pills of (1000 mg) of metformin in the evening.  We are going to start your new diabetes medicine today called empagliflozin.  I will start you on a lower dose.  Watch out for stomachache and diarrhea in addition to UTIs.   Feel free to message me in the meantime if you are having any concerning symptoms about low blood sugars.  Hypertension: Your blood pressure was much better when we rechecked it but it is still little bit high.  I do want to increase your dose of hydrochlorothiazide to 25 mg daily.  For now, he can simply take 2 pills/day.  Once you run out of your 12.5 mg pills, your new refill will be a higher dose so you only have to take 1 pill/day.  Overall, you should be taking 25 mg of hydrochlorothiazide per day.  Please come back to clinic in 2 weeks for [place] to check this to make sure it your blood pressure is getting better.

## 2020-09-15 NOTE — Progress Notes (Signed)
    SUBJECTIVE:   CHIEF COMPLAINT / HPI:   Diabetes His current diabetes medication includes -Metformin 500 mg twice daily -Lantus 20 units every morning He has been checking his morning blood glucose which has been roughly 130-140 in the past week.   HTN He reported that he had been consistent with his blood pressure medicine which includes: -Lisinopril 40 mg daily -HCTZ 12.5 mg daily -Amlodipine 10 mg daily  PERTINENT  PMH / PSH: History of CVA, hypertension and diabetes, BMI over 30  OBJECTIVE:   BP (!) 158/100   Pulse (!) 104   Ht 5\' 10"  (1.778 m)   Wt 216 lb 12.8 oz (98.3 kg)   SpO2 100%   BMI 31.11 kg/m    General: Alert and cooperative and appears to be in no acute distress Cardio: Normal S1 and S2, no S3 or S4. Rhythm is regular. No murmurs or rubs.   Pulm: Clear to auscultation bilaterally, no crackles, wheezing, or diminished breath sounds. Normal respiratory effort Extremities: No peripheral edema. Warm/ well perfused.  Strong radial pulses. Neuro: Cranial nerves grossly intact  ASSESSMENT/PLAN:   Uncontrolled hypertension His blood pressure was initially elevated to 160/112 on presentation to the office.  On repeat measurements, it had decreased to 148/100.  I would like to be particularly aggressive with this stroke risk factor based on his history of previous CVA and young age.  In addition to increasing his HCTZ today, I think that he will benefit from the addition of Jardiance to his diabetic regimen.  I will keep a close eye on his kidney function and consider referral to nephrology at his next visit. -Continue amlodipine 10 mg and lisinopril 40 mg -Increase HCTZ to 25 mg daily -Follow-up BMP today -Return to clinic in 2 weeks   Diabetes mellitus type II, uncontrolled (HCC), with obesity He did not have any documentation of his morning blood glucose although his verbal report sounds markedly improved if his morning blood sugars have been between  130-140.  Today, we will add Jardiance and continue to monitor.  I am hopeful that we will be able to decrease his Lantus use at his next visit. -Start Jardiance, 10 mg daily.  Adverse effects discussed. -He was encouraged to increase his metformin to 1000 mg twice daily (already takes XR formulation) -Continue Lantus 20 units daily -Check morning CBGs - assess for side effects when he returns in 2 weeks   At today's visit, he noted that he may be leaving his current job in the next month to transition to a new job.  During this transition, he might be without health insurance for a period of 3 months.  He was encouraged to return to clinic prior to leaving his current job so we can ensure all of his medications are appropriately managed for the following 36-month uninsured period.  2-month, MD [facility] [place]

## 2020-09-15 NOTE — Assessment & Plan Note (Addendum)
He did not have any documentation of his morning blood glucose although his verbal report sounds markedly improved if his morning blood sugars have been between 130-140.  Today, we will add Jardiance and continue to monitor.  I am hopeful that we will be able to decrease his Lantus use at his next visit. -Start Jardiance, 10 mg daily.  Adverse effects discussed. -He was encouraged to increase his metformin to 1000 mg twice daily (already takes XR formulation) -Continue Lantus 20 units daily -Check morning CBGs - assess for side effects when he returns in 2 weeks

## 2020-09-15 NOTE — Assessment & Plan Note (Addendum)
His blood pressure was initially elevated to 160/112 on presentation to the office.  On repeat measurements, it had decreased to 148/100.  I would like to be particularly aggressive with this stroke risk factor based on his history of previous CVA and young age.  In addition to increasing his HCTZ today, I think that he will benefit from the addition of Jardiance to his diabetic regimen.  I will keep a close eye on his kidney function and consider referral to nephrology at his next visit. -Continue amlodipine 10 mg and lisinopril 40 mg -Increase HCTZ to 25 mg daily -Follow-up BMP today -Return to clinic in 2 weeks

## 2020-09-16 LAB — BASIC METABOLIC PANEL
BUN/Creatinine Ratio: 14 (ref 9–20)
BUN: 22 mg/dL — ABNORMAL HIGH (ref 6–20)
CO2: 24 mmol/L (ref 20–29)
Calcium: 9.5 mg/dL (ref 8.7–10.2)
Chloride: 102 mmol/L (ref 96–106)
Creatinine, Ser: 1.52 mg/dL — ABNORMAL HIGH (ref 0.76–1.27)
GFR calc Af Amer: 67 mL/min/{1.73_m2} (ref >59)
GFR calc non Af Amer: 58 mL/min/{1.73_m2} — ABNORMAL LOW (ref >59)
Glucose: 207 mg/dL — ABNORMAL HIGH (ref 65–99)
Potassium: 4.5 mmol/L (ref 3.5–5.2)
Sodium: 143 mmol/L (ref 134–144)

## 2020-10-05 ENCOUNTER — Other Ambulatory Visit: Payer: Self-pay | Admitting: Family Medicine

## 2020-10-08 ENCOUNTER — Encounter: Payer: Self-pay | Admitting: Family Medicine

## 2020-10-12 ENCOUNTER — Other Ambulatory Visit: Payer: Self-pay | Admitting: Family Medicine

## 2020-10-12 MED ORDER — EMPAGLIFLOZIN 10 MG PO TABS
10.0000 mg | ORAL_TABLET | Freq: Every day | ORAL | 1 refills | Status: DC
Start: 2020-10-12 — End: 2020-12-25

## 2020-12-02 ENCOUNTER — Other Ambulatory Visit: Payer: Self-pay | Admitting: Family Medicine

## 2020-12-25 ENCOUNTER — Other Ambulatory Visit: Payer: Self-pay | Admitting: Family Medicine

## 2022-02-08 ENCOUNTER — Encounter: Payer: Self-pay | Admitting: *Deleted

## 2023-04-20 ENCOUNTER — Telehealth: Payer: Self-pay

## 2023-04-20 NOTE — Transitions of Care (Post Inpatient/ED Visit) (Signed)
   04/20/2023  Name: Thomas Burch MRN: [id number] DOB: 11-Apr-1986  Today's TOC FU Call Status: Today's TOC FU Call Status:: Unsuccessful Call (1st Attempt) Unsuccessful Call (1st Attempt) Date: 04/20/23  Attempted to reach the patient regarding the most recent Inpatient/ED visit.  Follow Up Plan: Additional outreach attempts will be made to reach the patient to complete the Transitions of Care (Post Inpatient/ED visit) call.   Signature Karena Addison, LPN [facility] Nurse Health Advisor Direct Dial [phone]

## 2023-04-24 NOTE — Transitions of Care (Post Inpatient/ED Visit) (Unsigned)
   04/24/2023  Name: Thomas Burch MRN: [id number] DOB: 11-Aug-1986  Today's TOC FU Call Status: Today's TOC FU Call Status:: Unsuccessful Call (2nd Attempt) Unsuccessful Call (1st Attempt) Date: 04/20/23 Unsuccessful Call (2nd Attempt) Date: 04/24/23  Attempted to reach the patient regarding the most recent Inpatient/ED visit.  Follow Up Plan: Additional outreach attempts will be made to reach the patient to complete the Transitions of Care (Post Inpatient/ED visit) call.   Signature Karena Addison, LPN [facility] Nurse Health Advisor Direct Dial [phone]

## 2023-04-25 ENCOUNTER — Telehealth: Payer: Self-pay

## 2023-04-25 NOTE — Transitions of Care (Post Inpatient/ED Visit) (Signed)
   04/25/2023  Name: Thomas Burch MRN: [id number] DOB: 1986/02/25  Today's TOC FU Call Status:    Attempted to reach the patient regarding the most recent Inpatient/ED visit.  Follow Up Plan: Additional outreach attempts will be made to reach the patient to complete the Transitions of Care (Post Inpatient/ED visit) call.   Signature Karena Addison, LPN [facility] Nurse Health Advisor Direct Dial [phone]

## 2023-04-25 NOTE — Transitions of Care (Post Inpatient/ED Visit) (Signed)
   04/25/2023  Name: Thomas Burch MRN: [id number] DOB: 06-05-1986  Today's TOC FU Call Status: Today's TOC FU Call Status:: Unsuccessful Call (3rd Attempt) Unsuccessful Call (3rd Attempt) Date: 04/25/23  Attempted to reach the patient regarding the most recent Inpatient/ED visit.  Follow Up Plan: No further outreach attempts will be made at this time. We have been unable to contact the patient.  Signature Karena Addison, LPN [facility] Nurse Health Advisor Direct Dial [phone]

## 2023-04-26 NOTE — Transitions of Care (Post Inpatient/ED Visit) (Signed)
04/26/2023  Name: Thomas Burch MRN: [id number] DOB: [birth year]  Today's TOC FU Call Status: Today's TOC FU Call Status:: Successful TOC FU Call Completed Unsuccessful Call (1st Attempt) Date: 04/20/23 Unsuccessful Call (2nd Attempt) Date: 04/24/23 [facility] FU Call Complete Date: 04/26/23  Transition Care Management Follow-up Telephone Call Date of Discharge: 04/24/23 Discharge Facility: Other ([facility]) Name of Other ([facility]) Discharge Facility: [facility] Type of Discharge: Inpatient Admission How have you been since you were released from the hospital?: Better Any questions or concerns?: No  Items Reviewed: Did you receive and understand the discharge instructions provided?: Yes Medications obtained,verified, and reconciled?: Yes (Medications Reviewed) Any new allergies since your discharge?: No Dietary orders reviewed?: Yes  Medications Reviewed Today: Medications Reviewed Today     Reviewed by Karena Addison, LPN (Licensed Practical Nurse) on 04/26/23 at 1422  Med List Status: <None>   Medication Order Taking? Sig Documenting Provider Last Dose Status Informant  acetaminophen (TYLENOL) 325 MG tablet [id number]  Take 2 tablets (650 mg total) by mouth every 6 (six) hours as needed for mild pain (or Fever >/= 101). Westley Chandler, MD  Active   amLODipine (NORVASC) 10 MG tablet [id number]  Take 1 tablet (10 mg total) by mouth daily. Westley Chandler, MD  Active   aspirin EC 81 MG tablet [id number] No Take 81 mg by mouth daily. Swallow whole. [provider] 05/08/2020 Unknown time Active Self  atorvastatin (LIPITOR) 80 MG tablet [id number]  Take 1 tablet (80 mg total) by mouth daily at 6 PM. Mirian Mo, MD  Active   blood glucose meter kit and supplies KIT [id number]  Dispense based on patient and insurance preference. Use up to four times daily as directed. (FOR ICD-9 250.00, 250.01). Allayne Stack, DO  Active Self  Blood Glucose Monitoring Suppl (CONTOUR MONITOR)  w/Device KIT [id number]  1 Device by Does not apply route as needed. Mirian Mo, MD  Active   Cholecalciferol (VITAMIN D-3) 25 MCG (1000 UT) CAPS [id number] No Take 1,000 Units by mouth daily. [provider] 05/07/2020 Unknown time Active Self  clopidogrel (PLAVIX) 75 MG tablet [id number]  Take 1 tablet (75 mg total) by mouth daily. Mirian Mo, MD  Active   Continuous Blood Gluc Sensor (DEXCOM G6 SENSOR) MISC [id number]  Inject 1 applicator into the skin as directed. Mirian Mo, MD  Active   Continuous Blood Gluc Transmit (DEXCOM G6 TRANSMITTER) MISC [id number]  Inject 1 Device into the skin as directed. Mirian Mo, MD  Active   ezetimibe (ZETIA) 10 MG tablet [id number]  Take 1 tablet (10 mg total) by mouth daily. Westley Chandler, MD  Active   glucose blood (CONTOUR NEXT TEST) test strip [id number]  1 each by Other route in the morning, at noon, in the evening, and at bedtime. Use as instructed Mirian Mo, MD  Active   hydrochlorothiazide (HYDRODIURIL) 25 MG tablet [id number]  Take 1 tablet (25 mg total) by mouth daily. Mirian Mo, MD  Active   Insulin Glargine, 1 Unit Dial, (TOUJEO SOLOSTAR) 300 UNIT/ML SOPN [id number] No Inject 15 Units into the skin at bedtime. Peggyann Shoals C, DO 05/07/2020 Unknown time Active Self           Med Note Nolon Bussing   Fri May 08, 2020  1:08 PM)    JARDIANCE 10 MG TABS tablet [id number]  TAKE ONE TABLET BY MOUTH ONE TIME DAILY Mirian Mo, MD  Active   Lancet Devices (MICROLET NEXT LANCING DEVICE) MISC  [id number]  1 Device by Does not apply route as needed. Mirian Mo, MD  Active   Lancets (FREESTYLE) lancets [id number]  Use once daily to check blood sugar Westley Chandler, MD  Active   levETIRAcetam (KEPPRA) 500 MG tablet [id number]  Take 1 tablet (500 mg total) by mouth 2 (two) times daily. Westley Chandler, MD  Active   lisinopril (ZESTRIL) 40 MG tablet [id number]  Take 1 tablet (40 mg total) by mouth daily. Mirian Mo, MD  Active   metFORMIN  (GLUCOPHAGE-XR) 500 MG 24 hr tablet [id number]  TAKE 2 TABLETS EVERY MORNING AND TAKE 2 TABLETS AT BEDTIME Mirian Mo, MD  Active   Microlet Lancets MISC [id number]  1 Device by Does not apply route in the morning, at noon, in the evening, and at bedtime. Mirian Mo, MD  Active   polyethylene glycol (MIRALAX / GLYCOLAX) 17 g packet [id number]  Take 17 g by mouth daily as needed for mild constipation. Westley Chandler, MD  Active   PRESCRIPTION MEDICATION [id number] No See admin instructions. CPAP- At bedtime [provider] 05/28/2019 pm Active Self  tamsulosin (FLOMAX) 0.4 MG CAPS capsule [id number]  Take 1 capsule (0.4 mg total) by mouth daily. Westley Chandler, MD  Active             Home Care and Equipment/Supplies: Were Home Health Services Ordered?: Yes [facility] Has Agency set up a time to come to your home?: Yes First Home Health Visit Date: 04/25/23 Any new equipment or medical supplies ordered?: Yes Name of Medical supply agency?: unknown Were you able to get the equipment/medical supplies?: Yes Do you have any questions related to the use of the equipment/supplies?: No  Functional Questionnaire: Do you need assistance with bathing/showering or dressing?: No Do you need assistance with meal preparation?: No Do you need assistance with eating?: No Do you have difficulty maintaining continence: No Do you need assistance with getting out of bed/getting out of a chair/moving?: No Do you have difficulty managing or taking your medications?: No  Follow up appointments reviewed: PCP Follow-up appointment confirmed?: NA Specialist Hospital Follow-up appointment confirmed?: Yes Date of Specialist follow-up appointment?: 05/04/23 Follow-Up Specialty Provider:: surgeon [facility] Do you need transportation to your follow-up appointment?: No Do you understand care options if your condition(s) worsen?: Yes-patient verbalized understanding    SIGNATURE  Karena Addison, LPN [facility] Nurse Health Advisor Direct Dial [phone]

## 2023-05-08 ENCOUNTER — Telehealth: Payer: Self-pay

## 2023-06-29 NOTE — Transitions of Care (Post Inpatient/ED Visit) (Signed)
   06/29/2023  Name: Thomas Burch MRN: [id number] DOB: September 14, 1985  Today's TOC FU Call Status:    Attempted to reach the patient regarding the most recent Inpatient/ED visit.  Follow Up Plan: No further outreach attempts will be made at this time. We have been unable to contact the patient. error Signature Karena Addison, LPN [facility] Nurse Health Advisor Direct Dial [phone]
